# Patient Record
Sex: Female | Born: 1995 | Race: Black or African American | Hispanic: No | Marital: Single | Smoking: Former smoker
Health system: Southern US, Community
[De-identification: ages and names within clinical notes are randomized; demographics above are authoritative.]

## PROBLEM LIST (undated history)

## (undated) ENCOUNTER — Inpatient Hospital Stay (HOSPITAL_COMMUNITY): Payer: Self-pay

## (undated) DIAGNOSIS — J45909 Unspecified asthma, uncomplicated: Secondary | ICD-10-CM

## (undated) DIAGNOSIS — R011 Cardiac murmur, unspecified: Secondary | ICD-10-CM

## (undated) DIAGNOSIS — F909 Attention-deficit hyperactivity disorder, unspecified type: Secondary | ICD-10-CM

## (undated) DIAGNOSIS — Z3400 Encounter for supervision of normal first pregnancy, unspecified trimester: Secondary | ICD-10-CM

## (undated) HISTORY — DX: Cardiac murmur, unspecified: R01.1

## (undated) HISTORY — PX: INDUCED ABORTION: SHX677

## (undated) HISTORY — PX: TONSILLECTOMY: SHX5217

## (undated) HISTORY — PX: TONSILLECTOMY: SUR1361

## (undated) HISTORY — DX: Encounter for supervision of normal first pregnancy, unspecified trimester: Z34.00

---
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
## 2014-01-01 ENCOUNTER — Emergency Department (HOSPITAL_COMMUNITY): Payer: No Typology Code available for payment source

## 2014-01-01 ENCOUNTER — Encounter (HOSPITAL_COMMUNITY): Payer: Self-pay | Admitting: Emergency Medicine

## 2014-01-01 ENCOUNTER — Emergency Department (HOSPITAL_COMMUNITY)
Admission: EM | Admit: 2014-01-01 | Discharge: 2014-01-01 | Disposition: A | Discharge status: Home / Self Care | Payer: No Typology Code available for payment source | Attending: Emergency Medicine | Admitting: Emergency Medicine

## 2014-01-01 DIAGNOSIS — S199XXA Unspecified injury of neck, initial encounter: Principal | ICD-10-CM

## 2014-01-01 DIAGNOSIS — S0993XA Unspecified injury of face, initial encounter: Secondary | ICD-10-CM | POA: Insufficient documentation

## 2014-01-01 DIAGNOSIS — S46909A Unspecified injury of unspecified muscle, fascia and tendon at shoulder and upper arm level, unspecified arm, initial encounter: Secondary | ICD-10-CM | POA: Insufficient documentation

## 2014-01-01 DIAGNOSIS — J45909 Unspecified asthma, uncomplicated: Secondary | ICD-10-CM | POA: Insufficient documentation

## 2014-01-01 DIAGNOSIS — S4980XA Other specified injuries of shoulder and upper arm, unspecified arm, initial encounter: Secondary | ICD-10-CM | POA: Insufficient documentation

## 2014-01-01 DIAGNOSIS — Y92410 Unspecified street and highway as the place of occurrence of the external cause: Secondary | ICD-10-CM | POA: Insufficient documentation

## 2014-01-01 DIAGNOSIS — Y9389 Activity, other specified: Secondary | ICD-10-CM | POA: Insufficient documentation

## 2014-01-01 DIAGNOSIS — F17200 Nicotine dependence, unspecified, uncomplicated: Secondary | ICD-10-CM | POA: Insufficient documentation

## 2014-01-01 MED ORDER — METHOCARBAMOL 500 MG PO TABS: 500.0000 mg | ORAL_TABLET | Freq: Two times a day (BID) | ORAL | Status: DC | PRN

## 2014-01-01 MED ORDER — NAPROXEN 500 MG PO TABS: 500.0000 mg | ORAL_TABLET | Freq: Two times a day (BID) | ORAL | Status: DC
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
## 2014-01-02 NOTE — ED Provider Notes (Signed)
Medical screening examination/treatment/procedure(s) were performed by non-physician practitioner and as supervising physician I was immediately available for consultation/collaboration.  EKG Interpretation   None         Richardean Canalavid H Chiron Campione, MD 01/02/14 [phone]

## 2014-07-15 ENCOUNTER — Encounter (HOSPITAL_COMMUNITY): Payer: Self-pay | Admitting: Emergency Medicine

## 2014-07-15 ENCOUNTER — Emergency Department (HOSPITAL_COMMUNITY)
Admission: EM | Admit: 2014-07-15 | Discharge: 2014-07-15 | Disposition: A | Discharge status: Home / Self Care | Payer: Medicaid Other | Attending: Emergency Medicine | Admitting: Emergency Medicine

## 2014-07-15 ENCOUNTER — Emergency Department (HOSPITAL_COMMUNITY): Payer: Medicaid Other

## 2014-07-15 DIAGNOSIS — Z23 Encounter for immunization: Secondary | ICD-10-CM | POA: Insufficient documentation

## 2014-07-15 DIAGNOSIS — S4980XA Other specified injuries of shoulder and upper arm, unspecified arm, initial encounter: Secondary | ICD-10-CM | POA: Diagnosis not present

## 2014-07-15 DIAGNOSIS — S46909A Unspecified injury of unspecified muscle, fascia and tendon at shoulder and upper arm level, unspecified arm, initial encounter: Secondary | ICD-10-CM | POA: Diagnosis not present

## 2014-07-15 DIAGNOSIS — S51009A Unspecified open wound of unspecified elbow, initial encounter: Secondary | ICD-10-CM | POA: Diagnosis present

## 2014-07-15 DIAGNOSIS — IMO0002 Reserved for concepts with insufficient information to code with codable children: Secondary | ICD-10-CM | POA: Diagnosis not present

## 2014-07-15 DIAGNOSIS — Z792 Long term (current) use of antibiotics: Secondary | ICD-10-CM | POA: Insufficient documentation

## 2014-07-15 DIAGNOSIS — Y9289 Other specified places as the place of occurrence of the external cause: Secondary | ICD-10-CM | POA: Diagnosis not present

## 2014-07-15 DIAGNOSIS — J45909 Unspecified asthma, uncomplicated: Secondary | ICD-10-CM | POA: Insufficient documentation

## 2014-07-15 DIAGNOSIS — F17200 Nicotine dependence, unspecified, uncomplicated: Secondary | ICD-10-CM | POA: Insufficient documentation

## 2014-07-15 DIAGNOSIS — W010XXA Fall on same level from slipping, tripping and stumbling without subsequent striking against object, initial encounter: Secondary | ICD-10-CM | POA: Insufficient documentation

## 2014-07-15 DIAGNOSIS — S4991XA Unspecified injury of right shoulder and upper arm, initial encounter: Secondary | ICD-10-CM

## 2014-07-15 DIAGNOSIS — W19XXXA Unspecified fall, initial encounter: Secondary | ICD-10-CM

## 2014-07-15 DIAGNOSIS — Y9389 Activity, other specified: Secondary | ICD-10-CM | POA: Diagnosis not present

## 2014-07-15 DIAGNOSIS — Z79899 Other long term (current) drug therapy: Secondary | ICD-10-CM | POA: Insufficient documentation

## 2014-07-15 DIAGNOSIS — S40811A Abrasion of right upper arm, initial encounter: Secondary | ICD-10-CM

## 2014-07-15 MED ORDER — TETANUS-DIPHTH-ACELL PERTUSSIS 5-2.5-18.5 LF-MCG/0.5 IM SUSP
0.5000 mL | Freq: Once | INTRAMUSCULAR | Status: AC
  Administered 2014-07-15: 0.5 mL via INTRAMUSCULAR
  Filled 2014-07-15: qty 0.5

## 2014-07-15 NOTE — ED Provider Notes (Signed)
Medical screening examination/treatment/procedure(s) were performed by non-physician practitioner and as supervising physician I was immediately available for consultation/collaboration.   EKG Interpretation None       Doug SouSam Zooey Schreurs, MD 07/15/14 2153

## 2014-07-15 NOTE — Discharge Instructions (Signed)
Keep wound clean. Refer to attached documents for more information.  °

## 2014-07-15 NOTE — ED Notes (Signed)
She states she tripped and fell, striking a mirror with her right elbow area as she fell.  She has a few superficial lacs. At post. Right elbow area which I cover with a Telfa dressing. She also c/o some pain in her right shoulder, of which she is able to slowly perform r.o.m.

## 2014-07-15 NOTE — ED Provider Notes (Signed)
CSN: [id number]     Arrival date & time 07/15/14  1414 History  This chart was scribed for non-physician practitioner, Emilia Beck, PA-C, working with Doug Sou, MD, by Bronson Curb, ED Scribe. This patient was seen in room WTR7/WTR7 and the patient's care was started at 3:33 PM.    Chief Complaint  Patient presents with  . Extremity Laceration      Patient is a 18 y.o. female presenting with skin laceration. The history is provided by the patient. No language interpreter was used.  Laceration Location:  Shoulder/arm Shoulder/arm laceration location:  R elbow Bleeding: controlled   Laceration mechanism:  Broken glass Foreign body present:  Unable to specify Relieved by:  None tried Worsened by:  Nothing tried Ineffective treatments:  None tried Tetanus status:  Out of date   HPI Comments: Darlene Morgan is a 18 y.o. female who presents to the Emergency Department complaining of laceration near right elbow that occurred PTA. Patient states she tripped and struck a mirror with her right elbow. She is unsure of foreign bodies. There are superficial abrasions near the right elbow, in addition to right shoulder pain. Patient is not UTD on tetanus.  Past Medical History  Diagnosis Date  . Asthma    Past Surgical History  Procedure Laterality Date  . Tonsillectomy     Family History  Problem Relation Age of Onset  . Hypertension Other    History  Substance Use Topics  . Smoking status: Light Tobacco Smoker  . Smokeless tobacco: Not on file  . Alcohol Use: No   OB History   Grav Para Term Preterm Abortions TAB SAB Ect Mult Living                 Review of Systems  Constitutional: Negative for fever and chills.  Musculoskeletal: Positive for arthralgias (right shoulder).  Skin: Positive for wound.  All other systems reviewed and are negative.     Allergies  Review of patient's allergies indicates no known allergies.  Home Medications   Prior to  Admission medications   Medication Sig Start Date End Date Taking? Authorizing Provider  albuterol (PROVENTIL HFA;VENTOLIN HFA) 108 (90 BASE) MCG/ACT inhaler Inhale 1 puff into the lungs every 6 (six) hours as needed for wheezing or shortness of breath.   Yes Historical Provider, MD  doxycycline (DORYX) 100 MG DR capsule Take 100 mg by mouth 2 (two) times daily.   Yes Historical Provider, MD  guaifenesin (ROBITUSSIN) 100 MG/5ML syrup Take 200 mg by mouth 3 (three) times daily as needed for cough.   Yes Historical Provider, MD  medroxyPROGESTERone (DEPO-PROVERA) 150 MG/ML injection Inject 150 mg into the muscle every 3 (three) months.   Yes Historical Provider, MD  megestrol (MEGACE) 40 MG/ML suspension Take 200 mg by mouth daily.   Yes Historical Provider, MD   Triage Vitals: BP 116/74  Pulse 91  Temp(Src) 98.2 F (36.8 C) (Oral)  Resp 16  SpO2 99%  LMP 07/03/2014  Physical Exam  Nursing note and vitals reviewed. Constitutional: She is oriented to person, place, and time. She appears well-developed and well-nourished. No distress.  HENT:  Head: Normocephalic and atraumatic.  Eyes: Conjunctivae and EOM are normal.  Neck: Neck supple. No tracheal deviation present.  Cardiovascular: Normal rate.   Pulmonary/Chest: Effort normal. No respiratory distress.  Musculoskeletal: Normal range of motion. She exhibits tenderness.  FROM of right shoulder. Mild generalized tenderness of right shoulder. No obvious deformity.  Neurological: She is alert and  oriented to person, place, and time.  Skin: Skin is warm and dry.  multiple superficial abrasions of dorsal right arm near elbow. No bleeding noted.  Psychiatric: She has a normal mood and affect. Her behavior is normal.    ED Course  Procedures (including critical care time)  DIAGNOSTIC STUDIES: Oxygen Saturation is 99% on room air, normal by my interpretation.    COORDINATION OF CARE: At 1537 Discussed treatment plan with patient which  includes imaging (right shoulder). Patient agrees.   Labs Review Labs Reviewed - No data to display  Imaging Review Dg Shoulder Right  07/15/2014   CLINICAL DATA:  Status post fall.  Right shoulder pain.  EXAM: RIGHT SHOULDER - 2+ VIEW  COMPARISON:  None.  FINDINGS: Imaged bones, joints and soft tissues appear normal.  IMPRESSION: Negative exam.   Electronically Signed   By: Drusilla Kannerhomas  Dalessio M.D.   On: 07/15/2014 16:04     EKG Interpretation None      MDM   Final diagnoses:  Fall, initial encounter  Arm abrasion, right, initial encounter  Right shoulder injury, initial encounter    4:25 PM Xray unremarkable for acute changes. Patient given tdap and abrasion was cleaned and bandaged. Vitals stable and patient afebrile. No other injury.   I personally performed the services described in this documentation, which was scribed in my presence. The recorded information has been reviewed and is accurate.    Emilia BeckKaitlyn Shaylon Aden, PA-C 07/15/14 1626

## 2014-11-27 ENCOUNTER — Encounter: Payer: Self-pay | Admitting: *Deleted

## 2015-01-30 ENCOUNTER — Encounter (HOSPITAL_COMMUNITY): Payer: Self-pay | Admitting: *Deleted

## 2015-01-30 ENCOUNTER — Emergency Department (HOSPITAL_COMMUNITY)
Admission: EM | Admit: 2015-01-30 | Discharge: 2015-01-30 | Disposition: A | Discharge status: Home / Self Care | Payer: Medicaid Other | Attending: Emergency Medicine | Admitting: Emergency Medicine

## 2015-01-30 DIAGNOSIS — Z79899 Other long term (current) drug therapy: Secondary | ICD-10-CM | POA: Insufficient documentation

## 2015-01-30 DIAGNOSIS — J45909 Unspecified asthma, uncomplicated: Secondary | ICD-10-CM | POA: Insufficient documentation

## 2015-01-30 DIAGNOSIS — Z720 Tobacco use: Secondary | ICD-10-CM | POA: Diagnosis not present

## 2015-01-30 DIAGNOSIS — B9689 Other specified bacterial agents as the cause of diseases classified elsewhere: Secondary | ICD-10-CM

## 2015-01-30 DIAGNOSIS — N898 Other specified noninflammatory disorders of vagina: Secondary | ICD-10-CM | POA: Diagnosis present

## 2015-01-30 DIAGNOSIS — Z793 Long term (current) use of hormonal contraceptives: Secondary | ICD-10-CM | POA: Insufficient documentation

## 2015-01-30 DIAGNOSIS — Z3202 Encounter for pregnancy test, result negative: Secondary | ICD-10-CM | POA: Diagnosis not present

## 2015-01-30 DIAGNOSIS — Z7952 Long term (current) use of systemic steroids: Secondary | ICD-10-CM | POA: Insufficient documentation

## 2015-01-30 DIAGNOSIS — N760 Acute vaginitis: Secondary | ICD-10-CM | POA: Insufficient documentation

## 2015-01-30 LAB — WET PREP, GENITAL
Trich, Wet Prep: NONE SEEN
Yeast Wet Prep HPF POC: NONE SEEN

## 2015-01-30 LAB — POC URINE PREG, ED: Preg Test, Ur: NEGATIVE

## 2015-01-30 MED ORDER — AZITHROMYCIN 1 G PO PACK
1.0000 g | PACK | Freq: Once | ORAL | Status: AC
  Administered 2015-01-30: 1 g via ORAL
  Filled 2015-01-30: qty 1

## 2015-01-30 MED ORDER — CLINDAMYCIN HCL 150 MG PO CAPS: 300.0000 mg | ORAL_CAPSULE | Freq: Two times a day (BID) | ORAL | Status: DC

## 2015-01-30 MED ORDER — LIDOCAINE HCL (PF) 1 % IJ SOLN
INTRAMUSCULAR | Status: AC
  Administered 2015-01-30: 2.1 mL
  Filled 2015-01-30: qty 5

## 2015-01-30 MED ORDER — CEFTRIAXONE SODIUM 250 MG IJ SOLR
250.0000 mg | Freq: Once | INTRAMUSCULAR | Status: AC
  Administered 2015-01-30: 250 mg via INTRAMUSCULAR
  Filled 2015-01-30: qty 250

## 2015-01-30 NOTE — Discharge Instructions (Signed)
Bacterial Vaginosis Bacterial vaginosis is a vaginal infection that occurs when the normal balance of bacteria in the vagina is disrupted. It results from an overgrowth of certain bacteria. This is the most common vaginal infection in women of childbearing age. Treatment is important to prevent complications, especially in pregnant women, as it can cause a premature delivery. CAUSES  Bacterial vaginosis is caused by an increase in harmful bacteria that are normally present in smaller amounts in the vagina. Several different kinds of bacteria can cause bacterial vaginosis. However, the reason that the condition develops is not fully understood. RISK FACTORS Certain activities or behaviors can put you at an increased risk of developing bacterial vaginosis, including:  Having a new sex partner or multiple sex partners.  Douching.  Using an intrauterine device (IUD) for contraception. Women do not get bacterial vaginosis from toilet seats, bedding, swimming pools, or contact with objects around them. SIGNS AND SYMPTOMS  Some women with bacterial vaginosis have no signs or symptoms. Common symptoms include:  Grey vaginal discharge.  A fishlike odor with discharge, especially after sexual intercourse.  Itching or burning of the vagina and vulva.  Burning or pain with urination. DIAGNOSIS  Your health care provider will take a medical history and examine the vagina for signs of bacterial vaginosis. A sample of vaginal fluid may be taken. Your health care provider will look at this sample under a microscope to check for bacteria and abnormal cells. A vaginal pH test may also be done.  TREATMENT  Bacterial vaginosis may be treated with antibiotic medicines. These may be given in the form of a pill or a vaginal cream. A second round of antibiotics may be prescribed if the condition comes back after treatment.  HOME CARE INSTRUCTIONS   Only take over-the-counter or prescription medicines as  directed by your health care provider.  If antibiotic medicine was prescribed, take it as directed. Make sure you finish it even if you start to feel better.  Do not have sex until treatment is completed.  Tell all sexual partners that you have a vaginal infection. They should see their health care provider and be treated if they have problems, such as a mild rash or itching.  Practice safe sex by using condoms and only having one sex partner. SEEK MEDICAL CARE IF:   Your symptoms are not improving after 3 days of treatment.  You have increased discharge or pain.  You have a fever. MAKE SURE YOU:   Understand these instructions.  Will watch your condition.  Will get help right away if you are not doing well or get worse. FOR MORE INFORMATION  Centers for Disease Control and Prevention, Division of STD Prevention: www.cdc.gov/std American Sexual Health Association (ASHA): www.ashastd.org  Document Released: 11/17/2005 Document Revised: 09/07/2013 Document Reviewed: 06/29/2013 [employer] Patient Information 2015 [employer]. This information is not intended to replace advice given to you by your health care provider. Make sure you discuss any questions you have with your health care provider.  

## 2015-01-30 NOTE — ED Provider Notes (Signed)
CSN: [id number]     Arrival date & time 01/30/15  1321 History   First MD Initiated Contact with Patient 01/30/15 1601     Chief Complaint  Patient presents with  . Possible Pregnancy  . Vaginal Discharge    Patient is a 19 y.o. female presenting with pregnancy problem and vaginal discharge. The history is provided by the patient. No language interpreter was used.  Possible Pregnancy  Vaginal Discharge  Darlene Morgan presents for STD check. She reports that she had unprotected sex a week ago and today she developed a large amount of white vaginal discharge. She is concerned that she has chlamydia again or she might be pregnant. She has a history of chlamydia 4 months ago and never got tested to see if she had cleared up with treatment. She denies any fevers, nausea, vomiting, abdominal pain, dysuria, diarrhea. Symptoms are mild and constant. She has no history of prior pregnancy.  Past Medical History  Diagnosis Date  . Asthma    Past Surgical History  Procedure Laterality Date  . Tonsillectomy     Family History  Problem Relation Age of Onset  . Hypertension Other    History  Substance Use Topics  . Smoking status: Light Tobacco Smoker  . Smokeless tobacco: Not on file  . Alcohol Use: No   OB History    No data available     Review of Systems  Genitourinary: Positive for vaginal discharge.  All other systems reviewed and are negative.     Allergies  Review of patient's allergies indicates no known allergies.  Home Medications   Prior to Admission medications   Medication Sig Start Date End Date Taking? Authorizing Provider  albuterol (PROVENTIL HFA;VENTOLIN HFA) 108 (90 BASE) MCG/ACT inhaler Inhale 1 puff into the lungs every 6 (six) hours as needed for wheezing or shortness of breath.    Historical Provider, MD  guaifenesin (ROBITUSSIN) 100 MG/5ML syrup Take 200 mg by mouth 3 (three) times daily as needed for cough.    Historical Provider, MD  medroxyPROGESTERone  (DEPO-PROVERA) 150 MG/ML injection Inject 150 mg into the muscle every 3 (three) months.    Historical Provider, MD  megestrol (MEGACE) 40 MG/ML suspension Take 200 mg by mouth daily.    Historical Provider, MD   BP 128/62 mmHg  Pulse 70  Temp(Src) 98 F (36.7 C) (Oral)  Resp 18  SpO2 100% Physical Exam  Constitutional: She is oriented to person, place, and time. She appears well-developed and well-nourished.  HENT:  Head: Normocephalic and atraumatic.  Cardiovascular: Normal rate and regular rhythm.   No murmur heard. Pulmonary/Chest: Effort normal and breath sounds normal. No respiratory distress.  Abdominal: Soft. There is no tenderness. There is no rebound and no guarding.  Genitourinary:  Moderate white vaginal discharge.  Os closed.  No CMT.  No adnexal tenderness.  Musculoskeletal: She exhibits no edema or tenderness.  Neurological: She is alert and oriented to person, place, and time.  Skin: Skin is warm and dry.  Psychiatric: She has a normal mood and affect. Her behavior is normal.  Nursing note and vitals reviewed.   ED Course  Procedures (including critical care time) Labs Review Labs Reviewed  WET PREP, GENITAL - Abnormal; Notable for the following:    Clue Cells Wet Prep HPF POC MANY (*)    WBC, Wet Prep HPF POC FEW (*)    All other components within normal limits  RPR  HIV ANTIBODY (ROUTINE TESTING)  POC URINE  PREG, ED  GC/CHLAMYDIA PROBE AMP ([place])    Imaging Review No results found.   EKG Interpretation None      MDM   Final diagnoses:  Bacterial vaginosis    Patient here for evaluation of vaginal discharge. Wet prep and exam are consistent with BV. There is no current evidence of PID or TOA. Treating for possible cervicitis given patient's risk factors and symptoms. Discussed with patient need to sustain from intercourse until she is completely treatment and she gets rechecked. Return precautions discussed.    Tilden FossaElizabeth Morgin Halls,  MD 01/30/15 2001
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
## 2015-01-30 NOTE — ED Notes (Signed)
PA at bedside.

## 2015-01-31 LAB — RPR: RPR Ser Ql: NONREACTIVE

## 2015-01-31 LAB — GC/CHLAMYDIA PROBE AMP (~~LOC~~) NOT AT ARMC
Chlamydia: NEGATIVE
Neisseria Gonorrhea: NEGATIVE

## 2015-01-31 LAB — HIV ANTIBODY (ROUTINE TESTING W REFLEX): HIV Screen 4th Generation wRfx: NONREACTIVE

## 2015-02-07 ENCOUNTER — Telehealth (HOSPITAL_BASED_OUTPATIENT_CLINIC_OR_DEPARTMENT_OTHER): Payer: Self-pay | Admitting: Emergency Medicine

## 2015-03-04 ENCOUNTER — Emergency Department (HOSPITAL_COMMUNITY): Payer: Medicaid Other

## 2015-03-04 ENCOUNTER — Emergency Department (HOSPITAL_COMMUNITY)
Admission: EM | Admit: 2015-03-04 | Discharge: 2015-03-04 | Disposition: A | Discharge status: Home / Self Care | Payer: Medicaid Other | Attending: Emergency Medicine | Admitting: Emergency Medicine

## 2015-03-04 ENCOUNTER — Encounter (HOSPITAL_COMMUNITY): Payer: Self-pay | Admitting: Emergency Medicine

## 2015-03-04 DIAGNOSIS — Z720 Tobacco use: Secondary | ICD-10-CM | POA: Diagnosis not present

## 2015-03-04 DIAGNOSIS — Y929 Unspecified place or not applicable: Secondary | ICD-10-CM | POA: Insufficient documentation

## 2015-03-04 DIAGNOSIS — J45909 Unspecified asthma, uncomplicated: Secondary | ICD-10-CM | POA: Diagnosis not present

## 2015-03-04 DIAGNOSIS — S80212A Abrasion, left knee, initial encounter: Secondary | ICD-10-CM | POA: Diagnosis not present

## 2015-03-04 DIAGNOSIS — Y999 Unspecified external cause status: Secondary | ICD-10-CM | POA: Insufficient documentation

## 2015-03-04 DIAGNOSIS — S199XXA Unspecified injury of neck, initial encounter: Secondary | ICD-10-CM | POA: Diagnosis not present

## 2015-03-04 DIAGNOSIS — Y939 Activity, unspecified: Secondary | ICD-10-CM | POA: Insufficient documentation

## 2015-03-04 DIAGNOSIS — S8992XA Unspecified injury of left lower leg, initial encounter: Secondary | ICD-10-CM | POA: Diagnosis present

## 2015-03-04 DIAGNOSIS — T07XXXA Unspecified multiple injuries, initial encounter: Secondary | ICD-10-CM

## 2015-03-04 DIAGNOSIS — S4992XA Unspecified injury of left shoulder and upper arm, initial encounter: Secondary | ICD-10-CM | POA: Insufficient documentation

## 2015-03-04 DIAGNOSIS — Z3202 Encounter for pregnancy test, result negative: Secondary | ICD-10-CM | POA: Diagnosis not present

## 2015-03-04 DIAGNOSIS — S60512A Abrasion of left hand, initial encounter: Secondary | ICD-10-CM | POA: Diagnosis not present

## 2015-03-04 DIAGNOSIS — Z79899 Other long term (current) drug therapy: Secondary | ICD-10-CM | POA: Diagnosis not present

## 2015-03-04 DIAGNOSIS — S3992XA Unspecified injury of lower back, initial encounter: Secondary | ICD-10-CM | POA: Diagnosis not present

## 2015-03-04 LAB — POC URINE PREG, ED: Preg Test, Ur: NEGATIVE

## 2015-03-04 MED ORDER — IBUPROFEN 800 MG PO TABS: 800.0000 mg | ORAL_TABLET | Freq: Three times a day (TID) | ORAL | Status: DC | PRN

## 2015-03-04 MED ORDER — IBUPROFEN 800 MG PO TABS
800.0000 mg | ORAL_TABLET | Freq: Once | ORAL | Status: AC
  Administered 2015-03-04: 800 mg via ORAL
  Filled 2015-03-04: qty 1
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
## 2015-03-04 NOTE — Discharge Instructions (Signed)
Abrasion °An abrasion is a cut or scrape of the skin. Abrasions do not extend through all layers of the skin and most heal within 10 days. It is important to care for your abrasion properly to prevent infection. °CAUSES  °Most abrasions are caused by falling on, or gliding across, the ground or other surface. When your skin rubs on something, the outer and inner layer of skin rubs off, causing an abrasion. °DIAGNOSIS  °Your caregiver will be able to diagnose an abrasion during a physical exam.  °TREATMENT  °Your treatment depends on how large and deep the abrasion is. Generally, your abrasion will be cleaned with water and a mild soap to remove any dirt or debris. An antibiotic ointment may be put over the abrasion to prevent an infection. A bandage (dressing) may be wrapped around the abrasion to keep it from getting dirty.  °You may need a tetanus shot if: °· You cannot remember when you had your last tetanus shot. °· You have never had a tetanus shot. °· The injury broke your skin. °If you get a tetanus shot, your arm may swell, get red, and feel warm to the touch. This is common and not a problem. If you need a tetanus shot and you choose not to have one, there is a rare chance of getting tetanus. Sickness from tetanus can be serious.  °HOME CARE INSTRUCTIONS  °· If a dressing was applied, change it at least once a day or as directed by your caregiver. If the bandage sticks, soak it off with warm water.   °· Wash the area with water and a mild soap to remove all the ointment 2 times a day. Rinse off the soap and pat the area dry with a clean towel.   °· Reapply any ointment as directed by your caregiver. This will help prevent infection and keep the bandage from sticking. Use gauze over the wound and under the dressing to help keep the bandage from sticking.   °· Change your dressing right away if it becomes wet or dirty.   °· Only take over-the-counter or prescription medicines for pain, discomfort, or fever as  directed by your caregiver.   °· Follow up with your caregiver within 24-48 hours for a wound check, or as directed. If you were not given a wound-check appointment, look closely at your abrasion for redness, swelling, or pus. These are signs of infection. °SEEK IMMEDIATE MEDICAL CARE IF:  °· You have increasing pain in the wound.   °· You have redness, swelling, or tenderness around the wound.   °· You have pus coming from the wound.   °· You have a fever or persistent symptoms for more than 2-3 days. °· You have a fever and your symptoms suddenly get worse. °· You have a bad smell coming from the wound or dressing.   °MAKE SURE YOU:  °· Understand these instructions. °· Will watch your condition. °· Will get help right away if you are not doing well or get worse. °Document Released: 08/27/2005 Document Revised: 11/03/2012 Document Reviewed: 10/21/2011 °ExitCare® Patient Information ©2015 [employer]. This information is not intended to replace advice given to you by your health care provider. Make sure you discuss any questions you have with your health care provider. °Assault, General °Assault includes any behavior, whether intentional or reckless, which results in bodily injury to another person and/or damage to property. Included in this would be any behavior, intentional or reckless, that by its nature would be understood (interpreted) by a reasonable person as intent   to harm another person or to damage his/her property. Threats may be oral or written. They may be communicated through regular mail, computer, fax, or phone. These threats may be direct or implied. °FORMS OF ASSAULT INCLUDE: °Physically assaulting a person. This includes physical threats to inflict physical harm as well as: °Slapping. °Hitting. °Poking. °Kicking. °Punching. °Pushing. °Arson. °Sabotage. °Equipment vandalism. °Damaging or destroying property. °Throwing or hitting objects. °Displaying a weapon or an object that appears to be a  weapon in a threatening manner. °Carrying a firearm of any kind. °Using a weapon to harm someone. °Using greater physical size/strength to intimidate another. °Making intimidating or threatening gestures. °Bullying. °Hazing. °Intimidating, threatening, hostile, or abusive language directed toward another person. °It communicates the intention to engage in violence against that person. And it leads a reasonable person to expect that violent behavior may occur. °Stalking another person. °IF IT HAPPENS AGAIN: °Immediately call for emergency help (911 in [place] °If someone poses clear and immediate danger to you, seek legal authorities to have a protective or restraining order put in place. °Less threatening assaults can at least be reported to authorities. °STEPS TO TAKE IF A SEXUAL ASSAULT HAS HAPPENED °Go to an area of safety. This may include a shelter or staying with a friend. Stay away from the area where you have been attacked. A large percentage of sexual assaults are caused by a friend, relative or associate. °If medications were given by your caregiver, take them as directed for the full length of time prescribed. °Only take over-the-counter or prescription medicines for pain, discomfort, or fever as directed by your caregiver. °If you have come in contact with a sexual disease, find out if you are to be tested again. If your caregiver is concerned about the HIV/AIDS virus, he/she may require you to have continued testing for several months. °For the protection of your privacy, test results can not be given over the phone. Make sure you receive the results of your test. If your test results are not back during your visit, make an appointment with your caregiver to find out the results. Do not assume everything is normal if you have not heard from your caregiver or the medical facility. It is important for you to follow up on all of your test results. °File appropriate papers with authorities. This is important  in all assaults, even if it has occurred in a family or by a friend. °SEEK MEDICAL CARE IF: °You have new problems because of your injuries. °You have problems that may be because of the medicine you are taking, such as: °Rash. °Itching. °Swelling. °Trouble breathing. °You develop belly (abdominal) pain, feel sick to your stomach (nausea) or are vomiting. °You begin to run a temperature. °You need supportive care or referral to a rape crisis center. These are centers with trained personnel who can help you get through this ordeal. °SEEK IMMEDIATE MEDICAL CARE IF: °You are afraid of being threatened, beaten, or abused. In [place] call 911. °You receive new injuries related to abuse. °You develop severe pain in any area injured in the assault or have any change in your condition that concerns you. °You faint or lose consciousness. °You develop chest pain or shortness of breath. °Document Released: 11/17/2005 Document Revised: 02/09/2012 Document Reviewed: 07/05/2008 °ExitCare® Patient Information ©2015 [employer]. This information is not intended to replace advice given to you by your health care provider. Make sure you discuss any questions you have with your health care provider. °Contusion °A contusion   is a deep bruise. Contusions are the result of an injury that caused bleeding under the skin. The contusion may turn blue, purple, or yellow. Minor injuries will give you a painless contusion, but more severe contusions may stay painful and swollen for a few weeks.  °CAUSES  °A contusion is usually caused by a blow, trauma, or direct force to an area of the body. °SYMPTOMS  °· Swelling and redness of the injured area. °· Bruising of the injured area. °· Tenderness and soreness of the injured area. °· Pain. °DIAGNOSIS  °The diagnosis can be made by taking a history and physical exam. An X-ray, CT scan, or MRI may be needed to determine if there were any associated injuries, such as fractures. °TREATMENT  °Specific  treatment will depend on what area of the body was injured. In general, the best treatment for a contusion is resting, icing, elevating, and applying cold compresses to the injured area. Over-the-counter medicines may also be recommended for pain control. Ask your caregiver what the best treatment is for your contusion. °HOME CARE INSTRUCTIONS  °· Put ice on the injured area. °¨ Put ice in a plastic bag. °¨ Place a towel between your skin and the bag. °¨ Leave the ice on for 15-20 minutes, 3-4 times a day, or as directed by your health care provider. °· Only take over-the-counter or prescription medicines for pain, discomfort, or fever as directed by your caregiver. Your caregiver may recommend avoiding anti-inflammatory medicines (aspirin, ibuprofen, and naproxen) for 48 hours because these medicines may increase bruising. °· Rest the injured area. °· If possible, elevate the injured area to reduce swelling. °SEEK IMMEDIATE MEDICAL CARE IF:  °· You have increased bruising or swelling. °· You have pain that is getting worse. °· Your swelling or pain is not relieved with medicines. °MAKE SURE YOU:  °· Understand these instructions. °· Will watch your condition. °· Will get help right away if you are not doing well or get worse. °Document Released: 08/27/2005 Document Revised: 11/22/2013 Document Reviewed: 09/22/2011 °ExitCare® Patient Information ©2015 [employer]. This information is not intended to replace advice given to you by your health care provider. Make sure you discuss any questions you have with your health care provider. ° °

## 2015-05-22 ENCOUNTER — Encounter (HOSPITAL_COMMUNITY): Payer: Self-pay | Admitting: *Deleted

## 2015-05-22 ENCOUNTER — Emergency Department (HOSPITAL_COMMUNITY)
Admission: EM | Admit: 2015-05-22 | Discharge: 2015-05-22 | Disposition: A | Discharge status: Home / Self Care | Payer: Medicaid Other | Attending: Emergency Medicine | Admitting: Emergency Medicine

## 2015-05-22 DIAGNOSIS — Z792 Long term (current) use of antibiotics: Secondary | ICD-10-CM | POA: Insufficient documentation

## 2015-05-22 DIAGNOSIS — B373 Candidiasis of vulva and vagina: Secondary | ICD-10-CM | POA: Insufficient documentation

## 2015-05-22 DIAGNOSIS — J45909 Unspecified asthma, uncomplicated: Secondary | ICD-10-CM | POA: Diagnosis not present

## 2015-05-22 DIAGNOSIS — Z202 Contact with and (suspected) exposure to infections with a predominantly sexual mode of transmission: Secondary | ICD-10-CM | POA: Diagnosis present

## 2015-05-22 DIAGNOSIS — Z720 Tobacco use: Secondary | ICD-10-CM | POA: Insufficient documentation

## 2015-05-22 DIAGNOSIS — Z3202 Encounter for pregnancy test, result negative: Secondary | ICD-10-CM | POA: Insufficient documentation

## 2015-05-22 DIAGNOSIS — B3731 Acute candidiasis of vulva and vagina: Secondary | ICD-10-CM

## 2015-05-22 DIAGNOSIS — Z79899 Other long term (current) drug therapy: Secondary | ICD-10-CM | POA: Diagnosis not present

## 2015-05-22 LAB — WET PREP, GENITAL
Clue Cells Wet Prep HPF POC: NONE SEEN
Trich, Wet Prep: NONE SEEN

## 2015-05-22 LAB — URINALYSIS, ROUTINE W REFLEX MICROSCOPIC
Bilirubin Urine: NEGATIVE
Glucose, UA: NEGATIVE mg/dL
Hgb urine dipstick: NEGATIVE
Ketones, ur: NEGATIVE mg/dL
Leukocytes, UA: NEGATIVE
Nitrite: NEGATIVE
Protein, ur: NEGATIVE mg/dL
Specific Gravity, Urine: 1.02 (ref 1.005–1.030)
Urobilinogen, UA: 0.2 mg/dL (ref 0.0–1.0)
pH: 7 (ref 5.0–8.0)

## 2015-05-22 LAB — POC URINE PREG, ED: Preg Test, Ur: NEGATIVE

## 2015-05-22 MED ORDER — AZITHROMYCIN 250 MG PO TABS
1000.0000 mg | ORAL_TABLET | Freq: Once | ORAL | Status: AC
  Administered 2015-05-22: 1000 mg via ORAL
  Filled 2015-05-22: qty 4

## 2015-05-22 MED ORDER — LIDOCAINE HCL (PF) 1 % IJ SOLN
0.9000 mL | Freq: Once | INTRAMUSCULAR | Status: AC
  Administered 2015-05-22: 0.9 mL
  Filled 2015-05-22: qty 5

## 2015-05-22 MED ORDER — CEFTRIAXONE SODIUM 250 MG IJ SOLR
250.0000 mg | Freq: Once | INTRAMUSCULAR | Status: AC
  Administered 2015-05-22: 250 mg via INTRAMUSCULAR
  Filled 2015-05-22: qty 250

## 2015-05-22 MED ORDER — FLUCONAZOLE 100 MG PO TABS
150.0000 mg | ORAL_TABLET | Freq: Once | ORAL | Status: AC
Start: 2015-05-22 — End: 2015-05-22
  Administered 2015-05-22: 150 mg via ORAL
  Filled 2015-05-22: qty 2

## 2015-05-22 NOTE — ED Provider Notes (Signed)
CSN: [id number]     Arrival date & time 05/22/15  1026 History   First MD Initiated Contact with Patient 05/22/15 1218     Chief Complaint  Patient presents with  . SEXUALLY TRANSMITTED DISEASE  . Possible Pregnancy     (Consider location/radiation/quality/duration/timing/severity/associated sxs/prior Treatment) HPI Comments: Patient is a 19 year old female presenting to the emergency department for vaginal itching without discharge over the last several days. No modifying factors identified. States also concerned about possible sexually transmitted diseases. She is also requesting a pregnancy test. Patient has had two previous pregnancies, one miscarriage and one abortion. No abdominal surgical history.   Patient is a 19 y.o. female presenting with pregnancy problem.  Possible Pregnancy    Past Medical History  Diagnosis Date  . Asthma    Past Surgical History  Procedure Laterality Date  . Tonsillectomy     Family History  Problem Relation Age of Onset  . Hypertension Other    History  Substance Use Topics  . Smoking status: Current Every Day Smoker    Types: Cigarettes  . Smokeless tobacco: Not on file  . Alcohol Use: Yes   OB History    No data available     Review of Systems  Genitourinary:       Vaginal itching  All other systems reviewed and are negative.     Allergies  Review of patient's allergies indicates no known allergies.  Home Medications   Prior to Admission medications   Medication Sig Start Date End Date Taking? Authorizing Provider  albuterol (PROVENTIL HFA;VENTOLIN HFA) 108 (90 BASE) MCG/ACT inhaler Inhale 2 puffs into the lungs every 6 (six) hours as needed for wheezing or shortness of breath (wheezing).     Historical Provider, MD  clindamycin (CLEOCIN) 150 MG capsule Take 2 capsules (300 mg total) by mouth 2 (two) times daily. 01/30/15   Tilden Fossa, MD  ibuprofen (ADVIL,MOTRIN) 800 MG tablet Take 1 tablet (800 mg total) by mouth every  8 (eight) hours as needed for mild pain. 03/04/15   Kristen N Ward, DO   BP 116/80 mmHg  Pulse 68  Temp(Src) 98.4 F (36.9 C) (Oral)  Resp 18  Ht 5\' 5"  (1.651 m)  SpO2 99%  LMP 05/08/2015 Physical Exam  Constitutional: She is oriented to person, place, and time. She appears well-developed and well-nourished. No distress.  HENT:  Head: Normocephalic and atraumatic.  Right Ear: External ear normal.  Left Ear: External ear normal.  Nose: Nose normal.  Mouth/Throat: Oropharynx is clear and moist.  Eyes: Conjunctivae are normal.  Neck: Normal range of motion. Neck supple.  No nuchal rigidity.   Cardiovascular: Normal rate, regular rhythm and normal heart sounds.   Pulmonary/Chest: Effort normal and breath sounds normal.  Abdominal: Soft. There is no tenderness.  Musculoskeletal: Normal range of motion.  Neurological: She is alert and oriented to person, place, and time.  Skin: Skin is warm and dry. She is not diaphoretic.  Psychiatric: She has a normal mood and affect.  Nursing note and vitals reviewed.  Exam performed by Francee Piccolo L,  exam chaperoned Date: 05/22/2015 Pelvic exam: normal external genitalia without evidence of trauma. VULVA: normal appearing vulva with no masses, tenderness or lesion. VAGINA: normal appearing vagina with normal color and discharge, no lesions. CERVIX: normal appearing cervix without lesions, cervical motion tenderness absent, cervical os closed with out purulent discharge; vaginal discharge - white and copious, Wet prep and DNA probe for chlamydia and GC obtained.  ADNEXA: normal adnexa in size, nontender and no masses UTERUS: uterus is normal size, shape, consistency and nontender.   ED Course  Procedures (including critical care time) Medications  cefTRIAXone (ROCEPHIN) injection 250 mg (250 mg Intramuscular Given 05/22/15 1408)  azithromycin (ZITHROMAX) tablet 1,000 mg (1,000 mg Oral Given 05/22/15 1413)  fluconazole (DIFLUCAN)  tablet 150 mg (150 mg Oral Given 05/22/15 1412)  lidocaine (PF) (XYLOCAINE) 1 % injection 0.9 mL (0.9 mLs Other Given 05/22/15 1408)    Labs Review Labs Reviewed  WET PREP, GENITAL - Abnormal; Notable for the following:    Yeast Wet Prep HPF POC FEW (*)    WBC, Wet Prep HPF POC FEW (*)    All other components within normal limits  URINALYSIS, ROUTINE W REFLEX MICROSCOPIC (NOT AT [facility])  POC URINE PREG, ED  GC/CHLAMYDIA PROBE AMP () NOT AT [facility]    Imaging Review No results found.   EKG Interpretation None      MDM   Final diagnoses:  Vaginal yeast infection    Filed Vitals:   05/22/15 1426  BP:   Pulse: 68  Temp:   Resp:    Afebrile, NAD, non-toxic appearing, AAOx4.  I have reviewed nursing notes, vital signs, and all appropriate lab and imaging results if ordered as above.  Patient to be discharged with instructions to follow up with OBGYN. Pt understands GC/Chlamydia cultures pending and that they will need to inform all sexual partners within the last 6 months if results return positive. Pt has been treated prophylacticly with azithromycin and rocephin due to pts history, pelvic exam, and wet prep with increased WBCs. Pt advised that she will receive a call in 48 hours if the test is positive and to refrain from sexual activity for 48 hours. If the test is positive, pt is advised to refrain from sexual activity for 10 days for the medicine to take effect.  Pt not concerning for PID because hemodynamically stable and no cervical motion tenderness on pelvic exam. Pt has also been treated with Diflucan for yeast infection. Discussed that because pt has had recent unprotected sex, might want to consider getting tested for HIV as well. Counseled pt that latex condoms are the only way to prevent against STDs or HIV.  Patient left without her paperwork.        Francee Piccolo, PA-C 05/22/15 1539  Elwin Mocha, MD 05/22/15 1556

## 2015-05-22 NOTE — ED Notes (Signed)
Pt having vaginal itching, is requesting std check and preg test, lmp 6/7.

## 2015-05-22 NOTE — ED Notes (Addendum)
Pt leaving room E 41. Asked again to patiently wait and states "I have to go" and was witnessed by this RN as well as Hospital doctor with phlebotomy, leaving the department in NAD. Denies pain. Pt did not sign and would not allow [place] to get a final set of vitals. States "I don't need my papers." Pt walking fast our of department.

## 2015-05-22 NOTE — Discharge Instructions (Signed)
Please follow up with your primary care physician in 1-2 days. If you do not have one please call the [facility] number listed above. Please follow up with Ob/Gyn to schedule a follow up appointment. Please read all discharge instructions and return precautions.    Candida Infection A Candida infection (also called yeast, fungus, and Monilia infection) is an overgrowth of yeast that can occur anywhere on the body. A yeast infection commonly occurs in warm, moist body areas. Usually, the infection remains localized but can spread to become a systemic infection. A yeast infection may be a sign of a more severe disease such as diabetes, leukemia, or AIDS. A yeast infection can occur in both men and women. In women, Candida vaginitis is a vaginal infection. It is one of the most common causes of vaginitis. Men usually do not have symptoms or know they have an infection until other problems develop. Men may find out they have a yeast infection because their sex partner has a yeast infection. Uncircumcised men are more likely to get a yeast infection than circumcised men. This is because the uncircumcised glans is not exposed to air and does not remain as dry as that of a circumcised glans. Older adults may develop yeast infections around dentures. CAUSES  Women  Antibiotics.  Steroid medication taken for a long time.  Being overweight (obese).  Diabetes.  Poor immune condition.  Certain serious medical conditions.  Immune suppressive medications for organ transplant patients.  Chemotherapy.  Pregnancy.  Menstruation.  Stress and fatigue.  Intravenous drug use.  Oral contraceptives.  Wearing tight-fitting clothes in the crotch area.  Catching it from a sex partner who has a yeast infection.  Spermicide.  Intravenous, urinary, or other catheters. Men  Catching it from a sex partner who has a yeast infection.  Having oral or anal sex with a person who has  the infection.  Spermicide.  Diabetes.  Antibiotics.  Poor immune system.  Medications that suppress the immune system.  Intravenous drug use.  Intravenous, urinary, or other catheters. SYMPTOMS  Women  Thick, white vaginal discharge.  Vaginal itching.  Redness and swelling in and around the vagina.  Irritation of the lips of the vagina and perineum.  Blisters on the vaginal lips and perineum.  Painful sexual intercourse.  Low blood sugar (hypoglycemia).  Painful urination.  Bladder infections.  Intestinal problems such as constipation, indigestion, bad breath, bloating, increase in gas, diarrhea, or loose stools. Men  Men may develop intestinal problems such as constipation, indigestion, bad breath, bloating, increase in gas, diarrhea, or loose stools.  Dry, cracked skin on the penis with itching or discomfort.  Jock itch.  Dry, flaky skin.  Athlete's foot.  Hypoglycemia. DIAGNOSIS  Women  A history and an exam are performed.  The discharge may be examined under a microscope.  A culture may be taken of the discharge. Men  A history and an exam are performed.  Any discharge from the penis or areas of cracked skin will be looked at under the microscope and cultured.  Stool samples may be cultured. TREATMENT  Women  Vaginal antifungal suppositories and creams.  Medicated creams to decrease irritation and itching on the outside of the vagina.  Warm compresses to the perineal area to decrease swelling and discomfort.  Oral antifungal medications.  Medicated vaginal suppositories or cream for repeated or recurrent infections.  Wash and dry the irritation areas before applying the cream.  Eating yogurt with Lactobacillus may  help with prevention and treatment.  Sometimes painting the vagina with gentian violet solution may help if creams and suppositories do not work. Men  Antifungal creams and oral antifungal medications.  Sometimes  treatment must continue for 30 days after the symptoms go away to prevent recurrence. HOME CARE INSTRUCTIONS  Women  Use cotton underwear and avoid tight-fitting clothing.  Avoid colored, scented toilet paper and deodorant tampons or pads.  Do not douche.  Keep your diabetes under control.  Finish all the prescribed medications.  Keep your skin clean and dry.  Consume milk or yogurt with Lactobacillus-active culture regularly. If you get frequent yeast infections and think that is what the infection is, there are over-the-counter medications that you can get. If the infection does not show healing in 3 days, talk to your caregiver.  Tell your sex partner you have a yeast infection. Your partner may need treatment also, especially if your infection does not clear up or recurs. Men  Keep your skin clean and dry.  Keep your diabetes under control.  Finish all prescribed medications.  Tell your sex partner that you have a yeast infection so he or she can be treated if necessary. SEEK MEDICAL CARE IF:   Your symptoms do not clear up or worsen in one week after treatment.  You have an oral temperature above 102 F (38.9 C).  You have trouble swallowing or eating for a prolonged time.  You develop blisters on and around your vagina.  You develop vaginal bleeding and it is not your menstrual period.  You develop abdominal pain.  You develop intestinal problems as mentioned above.  You get weak or light-headed.  You have painful or increased urination.  You have pain during sexual intercourse. MAKE SURE YOU:   Understand these instructions.  Will watch your condition.  Will get help right away if you are not doing well or get worse. Document Released: 12/25/2004 Document Revised: 04/03/2014 Document Reviewed: 04/08/2010 [facility] Patient Information 2015 [place], [place]. This information is not intended to replace advice given to you by your health care provider. Make  sure you discuss any questions you have with your health care provider.

## 2015-05-22 NOTE — ED Notes (Signed)
Pt requesting to leave department. States "I don't need my papers, I already got my meds and stuff my ride is here." Pt asked to patiently wait for PA to d/c patient. Patient agrees to wait. MD Gwendolyn Grant aware.

## 2015-05-23 LAB — GC/CHLAMYDIA PROBE AMP (~~LOC~~) NOT AT ARMC
Chlamydia: NEGATIVE
Neisseria Gonorrhea: NEGATIVE

## 2015-08-04 ENCOUNTER — Emergency Department (HOSPITAL_COMMUNITY)
Admission: EM | Admit: 2015-08-04 | Discharge: 2015-08-04 | Disposition: A | Discharge status: Home / Self Care | Payer: Medicaid Other | Attending: Emergency Medicine | Admitting: Emergency Medicine

## 2015-08-04 ENCOUNTER — Encounter (HOSPITAL_COMMUNITY): Payer: Self-pay | Admitting: Emergency Medicine

## 2015-08-04 DIAGNOSIS — Z3202 Encounter for pregnancy test, result negative: Secondary | ICD-10-CM | POA: Insufficient documentation

## 2015-08-04 DIAGNOSIS — Z79899 Other long term (current) drug therapy: Secondary | ICD-10-CM | POA: Diagnosis not present

## 2015-08-04 DIAGNOSIS — N939 Abnormal uterine and vaginal bleeding, unspecified: Secondary | ICD-10-CM | POA: Diagnosis present

## 2015-08-04 DIAGNOSIS — J45909 Unspecified asthma, uncomplicated: Secondary | ICD-10-CM | POA: Insufficient documentation

## 2015-08-04 DIAGNOSIS — Z720 Tobacco use: Secondary | ICD-10-CM | POA: Insufficient documentation

## 2015-08-04 DIAGNOSIS — R112 Nausea with vomiting, unspecified: Secondary | ICD-10-CM | POA: Diagnosis not present

## 2015-08-04 DIAGNOSIS — R109 Unspecified abdominal pain: Secondary | ICD-10-CM | POA: Insufficient documentation

## 2015-08-04 LAB — BASIC METABOLIC PANEL
Anion gap: 9 (ref 5–15)
BUN: 12 mg/dL (ref 6–20)
CO2: 25 mmol/L (ref 22–32)
Calcium: 9.2 mg/dL (ref 8.9–10.3)
Chloride: 107 mmol/L (ref 101–111)
Creatinine, Ser: 0.9 mg/dL (ref 0.44–1.00)
GFR calc Af Amer: >60 mL/min (ref >60)
GFR calc non Af Amer: >60 mL/min (ref >60)
Glucose, Bld: 90 mg/dL (ref 65–99)
Potassium: 3.4 mmol/L — ABNORMAL LOW (ref 3.5–5.1)
Sodium: 141 mmol/L (ref 135–145)

## 2015-08-04 LAB — URINALYSIS, ROUTINE W REFLEX MICROSCOPIC
Bilirubin Urine: NEGATIVE
Glucose, UA: NEGATIVE mg/dL
Ketones, ur: NEGATIVE mg/dL
Leukocytes, UA: NEGATIVE
Nitrite: NEGATIVE
Protein, ur: 30 mg/dL — AB
Specific Gravity, Urine: 1.025 (ref 1.005–1.030)
Urobilinogen, UA: 1 mg/dL (ref 0.0–1.0)
pH: 7.5 (ref 5.0–8.0)

## 2015-08-04 LAB — CBC WITH DIFFERENTIAL/PLATELET
Basophils Absolute: 0 10*3/uL (ref 0.0–0.1)
Basophils Relative: 0 % (ref 0–1)
Eosinophils Absolute: 0 10*3/uL (ref 0.0–0.7)
Eosinophils Relative: 0 % (ref 0–5)
HCT: 41.5 % (ref 36.0–46.0)
Hemoglobin: 13.9 g/dL (ref 12.0–15.0)
Lymphocytes Relative: 62 % — ABNORMAL HIGH (ref 12–46)
Lymphs Abs: 2.9 10*3/uL (ref 0.7–4.0)
MCH: 29.5 pg (ref 26.0–34.0)
MCHC: 33.5 g/dL (ref 30.0–36.0)
MCV: 88.1 fL (ref 78.0–100.0)
Monocytes Absolute: 0.4 10*3/uL (ref 0.1–1.0)
Monocytes Relative: 9 % (ref 3–12)
Neutro Abs: 1.4 10*3/uL — ABNORMAL LOW (ref 1.7–7.7)
Neutrophils Relative %: 29 % — ABNORMAL LOW (ref 43–77)
Platelets: 359 10*3/uL (ref 150–400)
RBC: 4.71 MIL/uL (ref 3.87–5.11)
RDW: 13.8 % (ref 11.5–15.5)
WBC: 4.7 10*3/uL (ref 4.0–10.5)

## 2015-08-04 LAB — I-STAT BETA HCG BLOOD, ED (MC, WL, AP ONLY): I-stat hCG, quantitative: <5 m[IU]/mL (ref <5)

## 2015-08-04 LAB — WET PREP, GENITAL
Clue Cells Wet Prep HPF POC: NONE SEEN
Trich, Wet Prep: NONE SEEN
WBC, Wet Prep HPF POC: NONE SEEN
Yeast Wet Prep HPF POC: NONE SEEN

## 2015-08-04 LAB — URINE MICROSCOPIC-ADD ON

## 2015-08-04 LAB — HCG, QUANTITATIVE, PREGNANCY: hCG, Beta Chain, Quant, S: <1 m[IU]/mL (ref <5)

## 2015-08-04 LAB — POC URINE PREG, ED: Preg Test, Ur: NEGATIVE

## 2015-08-04 LAB — ABO/RH: ABO/RH(D): O POS

## 2015-08-04 MED ORDER — PROMETHAZINE HCL 25 MG PO TABS: 25.0000 mg | ORAL_TABLET | Freq: Four times a day (QID) | ORAL | Status: DC | PRN

## 2015-08-04 MED ORDER — ONDANSETRON HCL 4 MG/2ML IJ SOLN
4.0000 mg | Freq: Once | INTRAMUSCULAR | Status: AC
  Administered 2015-08-04: 4 mg via INTRAVENOUS
  Filled 2015-08-04: qty 2

## 2015-08-04 MED ORDER — SODIUM CHLORIDE 0.9 % IV BOLUS (SEPSIS)
1000.0000 mL | Freq: Once | INTRAVENOUS | Status: AC
Start: 2015-08-04 — End: 2015-08-04
  Administered 2015-08-04: 1000 mL via INTRAVENOUS

## 2015-08-04 NOTE — ED Provider Notes (Signed)
CSN: [id number]     Arrival date & time 08/04/15  0930 History   First MD Initiated Contact with Patient 08/04/15 0932     Chief Complaint  Patient presents with  . Vaginal Bleeding     (Consider location/radiation/quality/duration/timing/severity/associated sxs/prior Treatment) The history is provided by the patient and medical records. No language interpreter was used.     Darlene Morgan is a 19 y.o. female G1P0 with a hx of asthma presents to the Emergency Department complaining of gradual, persistent, progressively worsening vaginal bleeding onset 8am with clots.  Pt reports positive pregnancy test last week.  Pt reports she is sexually active with 1 female partner.  Pt has a PCP who has done all her GYN care in the past. Associated symptoms include lower abd pain described as cramping, rated at a 10/10 and without radiation.  Nothing makes it better and nothing makes it worse.  Pt denies fever, headache, neck pain, chest pain, SOB, diarrhea, weakness, dizziness, syncope, dysuria.  Pt reports nausea and vomiting last week but none this week.  LMP: 06/03/15   Past Medical History  Diagnosis Date  . Asthma    Past Surgical History  Procedure Laterality Date  . Tonsillectomy     Family History  Problem Relation Age of Onset  . Hypertension Other    Social History  Substance Use Topics  . Smoking status: Current Every Day Smoker    Types: Cigarettes  . Smokeless tobacco: None  . Alcohol Use: Yes   OB History    No data available     Review of Systems  Constitutional: Negative for fever, diaphoresis, appetite change, fatigue and unexpected weight change.  HENT: Negative for mouth sores.   Eyes: Negative for visual disturbance.  Respiratory: Negative for cough, chest tightness, shortness of breath and wheezing.   Cardiovascular: Negative for chest pain.  Gastrointestinal: Positive for nausea, vomiting and abdominal pain. Negative for diarrhea and constipation.  Endocrine:  Negative for polydipsia, polyphagia and polyuria.  Genitourinary: Positive for vaginal bleeding. Negative for dysuria, urgency, frequency and hematuria.  Musculoskeletal: Negative for back pain and neck stiffness.  Skin: Negative for rash.  Allergic/Immunologic: Negative for immunocompromised state.  Neurological: Negative for syncope, light-headedness and headaches.  Hematological: Does not bruise/bleed easily.  Psychiatric/Behavioral: Negative for sleep disturbance. The patient is not nervous/anxious.       Allergies  Review of patient's allergies indicates no known allergies.  Home Medications   Prior to Admission medications   Medication Sig Start Date End Date Taking? Authorizing Provider  albuterol (PROVENTIL HFA;VENTOLIN HFA) 108 (90 BASE) MCG/ACT inhaler Inhale 2 puffs into the lungs every 6 (six) hours as needed for wheezing or shortness of breath (wheezing).    Yes Historical Provider, MD  clindamycin (CLEOCIN) 150 MG capsule Take 2 capsules (300 mg total) by mouth 2 (two) times daily. Patient not taking: Reported on 08/04/2015 01/30/15   Tilden Fossa, MD  ibuprofen (ADVIL,MOTRIN) 800 MG tablet Take 1 tablet (800 mg total) by mouth every 8 (eight) hours as needed for mild pain. Patient not taking: Reported on 08/04/2015 03/04/15   Kristen N Ward, DO  promethazine (PHENERGAN) 25 MG tablet Take 1 tablet (25 mg total) by mouth every 6 (six) hours as needed for nausea or vomiting. 08/04/15   Allen Basista, PA-C   BP 106/68 mmHg  Pulse 98  Temp(Src) 97 F (36.1 C) (Oral)  Resp 20  SpO2 100%  LMP 06/03/2015 Physical Exam  Constitutional: She appears well-developed and  well-nourished. No distress.  Awake, alert, nontoxic appearance  HENT:  Head: Normocephalic and atraumatic.  Mouth/Throat: Oropharynx is clear and moist. No oropharyngeal exudate.  Eyes: Conjunctivae are normal. No scleral icterus.  Neck: Normal range of motion. Neck supple.  Cardiovascular: Normal rate,  regular rhythm, normal heart sounds and intact distal pulses.   No murmur heard. Pulmonary/Chest: Effort normal and breath sounds normal. No respiratory distress. She has no wheezes.  Equal chest expansion  Abdominal: Soft. Bowel sounds are normal. She exhibits no mass. There is no tenderness. There is no rebound and no guarding. Hernia confirmed negative in the right inguinal area and confirmed negative in the left inguinal area.  Genitourinary: Uterus normal. No labial fusion. There is no rash, tenderness or lesion on the right labia. There is no rash, tenderness or lesion on the left labia. Uterus is not deviated, not enlarged, not fixed and not tender. Cervix exhibits no motion tenderness, no discharge and no friability. Right adnexum displays no mass, no tenderness and no fullness. Left adnexum displays no mass, no tenderness and no fullness. There is bleeding in the vagina. No erythema or tenderness in the vagina. No foreign body around the vagina. No signs of injury around the vagina. No vaginal discharge found.  Musculoskeletal: Normal range of motion. She exhibits no edema.  Lymphadenopathy:       Right: No inguinal adenopathy present.       Left: No inguinal adenopathy present.  Neurological: She is alert.  Speech is clear and goal oriented Moves extremities without ataxia  Skin: Skin is warm and dry. She is not diaphoretic. No erythema.  Psychiatric: She has a normal mood and affect.  Nursing note and vitals reviewed.   ED Course  Procedures (including critical care time) Labs Review Labs Reviewed  BASIC METABOLIC PANEL - Abnormal; Notable for the following:    Potassium 3.4 (*)    All other components within normal limits  CBC WITH DIFFERENTIAL/PLATELET - Abnormal; Notable for the following:    Neutrophils Relative % 29 (*)    Neutro Abs 1.4 (*)    Lymphocytes Relative 62 (*)    All other components within normal limits  URINALYSIS, ROUTINE W REFLEX MICROSCOPIC (NOT AT [facility])  - Abnormal; Notable for the following:    APPearance CLOUDY (*)    Hgb urine dipstick LARGE (*)    Protein, ur 30 (*)    All other components within normal limits  URINE MICROSCOPIC-ADD ON - Abnormal; Notable for the following:    Squamous Epithelial / LPF FEW (*)    Bacteria, UA FEW (*)    All other components within normal limits  WET PREP, GENITAL  HCG, QUANTITATIVE, PREGNANCY  RPR  HIV ANTIBODY (ROUTINE TESTING)  I-STAT BETA HCG BLOOD, ED ([facility], [facility], [facility] ONLY)  POC URINE PREG, ED  ABO/RH  ABO/RH  GC/CHLAMYDIA PROBE AMP ([place]) NOT AT [facility]    Imaging Review No results found. I have personally reviewed and evaluated these images and lab results as part of my medical decision-making.   EKG Interpretation None      MDM   Final diagnoses:  Vaginal bleeding    Darlene Morgan presents with vaginal bleeding since this AM.  Pt reports 2 positive pregnancy tests at home.  Abdomen is soft and nontender. Will begin workup.  12:12 PM Exam without cervical motion tenderness or adnexal tenderness. Upright red blood from the cervix noted in the vaginal vault. Pregnancy test is negative here in the  emergency department. hCG is less than 1.  Patient is without significant pain, highly doubt ovarian torsion. Wet prep and urinalysis pending.  We will plan for follow-up with OB/GYN for vaginal bleeding.  12:49 PM Wet prep without evidence of infection.  HCG Quant less than 1. No evidence of pregnancy. Will have patient follow with OB/GYN. Her abdomen remained soft and nontender on repeat exam.  No anemia noted on CBC.  BP 106/68 mmHg  Pulse 98  Temp(Src) 97 F (36.1 C) (Oral)  Resp 20  SpO2 100%  LMP 06/03/2015    Dierdre Forth, PA-C 08/04/15 1251  Mirian Mo, MD 08/05/15 [phone]

## 2015-08-04 NOTE — ED Notes (Signed)
Pt with c/o vaginal bleeding that began this morning around 9am.  She said she has had a positive pregnancy test at home.  She believes she is having a miscarriage.

## 2015-08-04 NOTE — ED Notes (Signed)
Per pt, states she has been having some vaginal cramping-now bleeding since this am-doesn't know how many weeks pregnant she is

## 2015-08-04 NOTE — ED Notes (Signed)
Pelvic cart outside room/supplies ready on bedside table

## 2015-08-04 NOTE — Discharge Instructions (Signed)
1. Medications: phenergan, usual home medications 2. Treatment: rest, drink plenty of fluids, advance diet slowly 3. Follow Up: Please followup with your primary doctor in 2 days for discussion of your diagnoses and further evaluation after today's visit; if you do not have a primary care doctor use the resource guide provided to find one; Please return to the ER for persistent vomiting, high fevers or worsening symptoms    Abnormal Uterine Bleeding Abnormal uterine bleeding can affect women at various stages in life, including teenagers, women in their reproductive years, pregnant women, and women who have reached menopause. Several kinds of uterine bleeding are considered abnormal, including:  Bleeding or spotting between periods.   Bleeding after sexual intercourse.   Bleeding that is heavier or more than normal.   Periods that last longer than usual.  Bleeding after menopause.  Many cases of abnormal uterine bleeding are minor and simple to treat, while others are more serious. Any type of abnormal bleeding should be evaluated by your health care provider. Treatment will depend on the cause of the bleeding. HOME CARE INSTRUCTIONS Monitor your condition for any changes. The following actions may help to alleviate any discomfort you are experiencing:  Avoid the use of tampons and douches as directed by your health care provider.  Change your pads frequently. You should get regular pelvic exams and Pap tests. Keep all follow-up appointments for diagnostic tests as directed by your health care provider.  SEEK MEDICAL CARE IF:   Your bleeding lasts more than 1 week.   You feel dizzy at times.  SEEK IMMEDIATE MEDICAL CARE IF:   You pass out.   You are changing pads every 15 to 30 minutes.   You have abdominal pain.  You have a fever.   You become sweaty or weak.   You are passing large blood clots from the vagina.   You start to feel nauseous and vomit. MAKE  SURE YOU:   Understand these instructions.  Will watch your condition.  Will get help right away if you are not doing well or get worse. Document Released: 11/17/2005 Document Revised: 11/22/2013 Document Reviewed: 06/16/2013 [facility] Patient Information 2015 [place], [place]. This information is not intended to replace advice given to you by your health care provider. Make sure you discuss any questions you have with your health care provider.

## 2015-08-05 LAB — HIV ANTIBODY (ROUTINE TESTING W REFLEX): HIV Screen 4th Generation wRfx: NONREACTIVE

## 2015-08-05 LAB — RPR: RPR Ser Ql: NONREACTIVE

## 2015-08-07 LAB — GC/CHLAMYDIA PROBE AMP (~~LOC~~) NOT AT ARMC
Chlamydia: NEGATIVE
Neisseria Gonorrhea: NEGATIVE

## 2015-11-18 ENCOUNTER — Encounter (HOSPITAL_COMMUNITY): Payer: Self-pay | Admitting: Emergency Medicine

## 2015-11-18 ENCOUNTER — Emergency Department (HOSPITAL_COMMUNITY)
Admission: EM | Admit: 2015-11-18 | Discharge: 2015-11-18 | Disposition: A | Discharge status: Home / Self Care | Payer: Medicaid Other | Attending: Emergency Medicine | Admitting: Emergency Medicine

## 2015-11-18 DIAGNOSIS — Z3202 Encounter for pregnancy test, result negative: Secondary | ICD-10-CM | POA: Diagnosis not present

## 2015-11-18 DIAGNOSIS — J45909 Unspecified asthma, uncomplicated: Secondary | ICD-10-CM | POA: Diagnosis not present

## 2015-11-18 DIAGNOSIS — K0889 Other specified disorders of teeth and supporting structures: Secondary | ICD-10-CM | POA: Insufficient documentation

## 2015-11-18 DIAGNOSIS — F17210 Nicotine dependence, cigarettes, uncomplicated: Secondary | ICD-10-CM | POA: Insufficient documentation

## 2015-11-18 DIAGNOSIS — L089 Local infection of the skin and subcutaneous tissue, unspecified: Secondary | ICD-10-CM

## 2015-11-18 DIAGNOSIS — N72 Inflammatory disease of cervix uteri: Secondary | ICD-10-CM | POA: Diagnosis not present

## 2015-11-18 DIAGNOSIS — Z79899 Other long term (current) drug therapy: Secondary | ICD-10-CM | POA: Diagnosis not present

## 2015-11-18 DIAGNOSIS — Z202 Contact with and (suspected) exposure to infections with a predominantly sexual mode of transmission: Secondary | ICD-10-CM | POA: Diagnosis present

## 2015-11-18 LAB — URINALYSIS, ROUTINE W REFLEX MICROSCOPIC
Bilirubin Urine: NEGATIVE
Glucose, UA: NEGATIVE mg/dL
Hgb urine dipstick: NEGATIVE
Ketones, ur: NEGATIVE mg/dL
Nitrite: NEGATIVE
Protein, ur: NEGATIVE mg/dL
Specific Gravity, Urine: 1.022 (ref 1.005–1.030)
pH: 7 (ref 5.0–8.0)

## 2015-11-18 LAB — WET PREP, GENITAL
Sperm: NONE SEEN
Trich, Wet Prep: NONE SEEN

## 2015-11-18 LAB — URINE MICROSCOPIC-ADD ON
Bacteria, UA: NONE SEEN
RBC / HPF: NONE SEEN RBC/hpf (ref 0–5)
WBC, UA: NONE SEEN WBC/hpf (ref 0–5)

## 2015-11-18 LAB — POC URINE PREG, ED: Preg Test, Ur: NEGATIVE

## 2015-11-18 MED ORDER — LIDOCAINE HCL (PF) 1 % IJ SOLN
INTRAMUSCULAR | Status: AC
  Filled 2015-11-18: qty 5

## 2015-11-18 MED ORDER — FLUCONAZOLE 150 MG PO TABS
150.0000 mg | ORAL_TABLET | Freq: Once | ORAL | Status: AC
  Administered 2015-11-18: 150 mg via ORAL
  Filled 2015-11-18: qty 1

## 2015-11-18 MED ORDER — AZITHROMYCIN 250 MG PO TABS
1000.0000 mg | ORAL_TABLET | Freq: Once | ORAL | Status: AC
  Administered 2015-11-18: 1000 mg via ORAL
  Filled 2015-11-18: qty 4

## 2015-11-18 MED ORDER — CEPHALEXIN 500 MG PO CAPS: 500.0000 mg | ORAL_CAPSULE | Freq: Three times a day (TID) | ORAL | Status: DC

## 2015-11-18 MED ORDER — CEFTRIAXONE SODIUM 250 MG IJ SOLR
250.0000 mg | Freq: Once | INTRAMUSCULAR | Status: AC
  Administered 2015-11-18: 250 mg via INTRAMUSCULAR
  Filled 2015-11-18: qty 250

## 2015-11-18 NOTE — ED Notes (Signed)
Pt from home c/o bilateral dental pain from her wisdom teeth, finger pain from a broken fingernail, and requesting to be checked for STD's due to being told by her significant other that he had chlamydia.  Pt is denying any symptoms currently.

## 2015-11-18 NOTE — ED Provider Notes (Signed)
CSN: [id number]     Arrival date & time 11/18/15  1410 History   First MD Initiated Contact with Patient 11/18/15 1503     Chief Complaint  Patient presents with  . STD check   . Dental Pain     (Consider location/radiation/quality/duration/timing/severity/associated sxs/prior Treatment) HPI Darlene Morgan is a 19 y.o. female with history of asthma, presents to emergency department with multiple complaints. Patient states that she has been having jaw pain for several days. She states that pain is mainly over her bilateral wisdom teeth are coming in through the skin. She denies any facial swelling. No fever or chills. Has not tried any medications for this. Patient does have a dentist and will call them tomorrow. Patient is also complaining of pain to the left fifth finger. He states 3 days ago she hit her finger on an object and states it pulled her fingernail back. Patient had acrylic nails on. Patient states she's unable to get her acrylic nails off. She states yesterday he noticed yellow drainage from underneath the nail. She has been treating it with peroxide. Patient also is complaining of vaginal discharge and states her boyfriend told her today that he gave her chlamydia. Patient states last intercourse with this person was 2 months ago. She does admit to thick white vaginal discharge, denies any abdominal pain or vaginal pain. No nausea or vomiting. No urinary symptoms. No back pain. Denies pregnancy.=  Past Medical History  Diagnosis Date  . Asthma    Past Surgical History  Procedure Laterality Date  . Tonsillectomy     Family History  Problem Relation Age of Onset  . Hypertension Other    Social History  Substance Use Topics  . Smoking status: Current Every Day Smoker    Types: Cigarettes  . Smokeless tobacco: None  . Alcohol Use: Yes   OB History    No data available     Review of Systems  Constitutional: Negative for fever and chills.  HENT: Positive for dental  problem. Negative for facial swelling.   Respiratory: Negative for cough, chest tightness and shortness of breath.   Cardiovascular: Negative for chest pain, palpitations and leg swelling.  Gastrointestinal: Negative for nausea, vomiting, abdominal pain and diarrhea.  Genitourinary: Positive for vaginal discharge. Negative for dysuria, flank pain, vaginal bleeding, vaginal pain and pelvic pain.  Musculoskeletal: Positive for arthralgias. Negative for myalgias, neck pain and neck stiffness.  Skin: Positive for wound. Negative for rash.  Neurological: Negative for dizziness, weakness and headaches.  All other systems reviewed and are negative.     Allergies  Review of patient's allergies indicates no known allergies.  Home Medications   Prior to Admission medications   Medication Sig Start Date End Date Taking? Authorizing Provider  albuterol (PROVENTIL HFA;VENTOLIN HFA) 108 (90 BASE) MCG/ACT inhaler Inhale 2 puffs into the lungs every 6 (six) hours as needed for wheezing or shortness of breath (wheezing).     Historical Provider, MD  clindamycin (CLEOCIN) 150 MG capsule Take 2 capsules (300 mg total) by mouth 2 (two) times daily. Patient not taking: Reported on 08/04/2015 01/30/15   Tilden FossaElizabeth Rees, MD  ibuprofen (ADVIL,MOTRIN) 800 MG tablet Take 1 tablet (800 mg total) by mouth every 8 (eight) hours as needed for mild pain. Patient not taking: Reported on 08/04/2015 03/04/15   Kristen N Ward, DO  promethazine (PHENERGAN) 25 MG tablet Take 1 tablet (25 mg total) by mouth every 6 (six) hours as needed for nausea or vomiting. 08/04/15  Hannah Muthersbaugh, PA-C   BP 126/62 mmHg  Pulse 83  Temp(Src) 97.6 F (36.4 C) (Oral)  Resp 14  SpO2 100% Physical Exam  Constitutional: She appears well-developed and well-nourished. No distress.  HENT:  Head: Normocephalic.  Bilateral upper and lower molars partially erupted through the skin. No surrounding gum swelling. No facial swelling. No swelling  under the tongue.   Eyes: Conjunctivae are normal.  Neck: Neck supple.  Cardiovascular: Normal rate, regular rhythm and normal heart sounds.   Pulmonary/Chest: Effort normal and breath sounds normal. No respiratory distress. She has no wheezes. She has no rales.  Abdominal: Soft. Bowel sounds are normal. She exhibits no distension. There is no tenderness. There is no rebound.  Genitourinary:  Normal external genitalia. White vaginal canal. Cervix erythematous. No CMT, no adnexal tenderness.   Musculoskeletal: She exhibits no edema.  Left fifth finger and fingernail partially avulsed from the skin, with yellow purulent drainage. Patient has acrylic nails on that she is unable to pull off. No soft tissue pain or tenderness  Neurological: She is alert.  Skin: Skin is warm and dry.  Psychiatric: She has a normal mood and affect. Her behavior is normal.  Nursing note and vitals reviewed.   ED Course  Procedures (including critical care time) Labs Review Labs Reviewed  WET PREP, GENITAL - Abnormal; Notable for the following:    Yeast Wet Prep HPF POC PRESENT (*)    Clue Cells Wet Prep HPF POC PRESENT (*)    WBC, Wet Prep HPF POC MANY (*)    All other components within normal limits  URINALYSIS, ROUTINE W REFLEX MICROSCOPIC (NOT AT [facility]) - Abnormal; Notable for the following:    Leukocytes, UA SMALL (*)    All other components within normal limits  URINE MICROSCOPIC-ADD ON - Abnormal; Notable for the following:    Squamous Epithelial / LPF 0-5 (*)    All other components within normal limits  POC URINE PREG, ED  GC/CHLAMYDIA PROBE AMP ([place]) NOT AT [facility]    Imaging Review No results found. I have personally reviewed and evaluated these images and lab results as part of my medical decision-making.   EKG Interpretation None      MDM   Final diagnoses:  Cervicitis  Finger infection  Pain, dental    patient with multiple complaints. As far as her dental pain, she will  follow-up with her dentist. No signs of infection. Pain is most likely from impacted or interrupting wisdom teeth. For her subungual infection, will do at home soapy water soaks and placed on Keflex. Patient's wet prep showed yeast infection, as well as many white blood cells. I covered her with Rocephin 250 mg IM as well as Zithromax 1 g by mouth for gonorrhea/chlamydia. Also given her 150 mg of Diflucan for yeast. No evidence of PID. She will follow up with her doctor. Safe sex practices discussed.  Filed Vitals:   11/18/15 1414  BP: 126/62  Pulse: 83  Temp: 97.6 F (36.4 C)  TempSrc: Oral  Resp: 14  SpO2: 100%     Jaynie Crumble, PA-C 11/18/15 1643  Leta Baptist, MD 11/22/15 [phone]

## 2015-11-18 NOTE — Discharge Instructions (Signed)
Take ibuprofen for pain. Keflex as prescribed for finger infection. Warm soapy water soaks several times a day. Always use protection with intercourse. Follow up with your primary care doctor and a dentist.    Cervicitis Cervicitis is a soreness and swelling (inflammation) of the cervix. Your cervix is located at the bottom of your uterus. It opens up to the vagina. CAUSES   Sexually transmitted infections (STIs).   Allergic reaction.   Medicines or birth control devices that are put in the vagina.   Injury to the cervix.   Bacterial infections.  RISK FACTORS You are at greater risk if you:  Have unprotected sexual intercourse.  Have sexual intercourse with many partners.  Began sexual intercourse at an early age.  Have a history of STIs. SYMPTOMS  There may be no symptoms. If symptoms occur, they may include:   Gray, white, yellow, or bad-smelling vaginal discharge.   Pain or itching of the area outside the vagina.   Painful sexual intercourse.   Lower abdominal or lower back pain, especially during intercourse.   Frequent urination.   Abnormal vaginal bleeding between periods, after sexual intercourse, or after menopause.   Pressure or a heavy feeling in the pelvis.  DIAGNOSIS  Diagnosis is made after a pelvic exam. Other tests may include:   Examination of any discharge under a microscope (wet prep).   A Pap test.  TREATMENT  Treatment will depend on the cause of cervicitis. If it is caused by an STI, both you and your partner will need to be treated. Antibiotic medicines will be given.  HOME CARE INSTRUCTIONS   Do not have sexual intercourse until your health care provider says it is okay.   Do not have sexual intercourse until your partner has been treated, if your cervicitis is caused by an STI.   Take your antibiotics as directed. Finish them even if you start to feel better.  SEEK MEDICAL CARE IF:  Your symptoms come back.   You  have a fever.  MAKE SURE YOU:   Understand these instructions.  Will watch your condition.  Will get help right away if you are not doing well or get worse.   This information is not intended to replace advice given to you by your health care provider. Make sure you discuss any questions you have with your health care provider.   Document Released: 11/17/2005 Document Revised: 11/22/2013 Document Reviewed: 05/11/2013 Elsevier Interactive Patient Education [employer]2016 Elsevier Inc.

## 2015-11-19 LAB — GC/CHLAMYDIA PROBE AMP (~~LOC~~) NOT AT ARMC
Chlamydia: NEGATIVE
Neisseria Gonorrhea: NEGATIVE

## 2015-11-22 ENCOUNTER — Telehealth (HOSPITAL_BASED_OUTPATIENT_CLINIC_OR_DEPARTMENT_OTHER): Payer: Self-pay | Admitting: Emergency Medicine

## 2015-12-02 NOTE — L&D Delivery Note (Signed)
Delivery Note After a brief 2nd stage, At 5:14 AM a viable female was delivered via Vaginal, Spontaneous Delivery (Presentation: ;ROA  ).  APGAR: 8/9, ; weight pending.   After 2 minutes, the cord was clamped and cut. 40 units of pitocin diluted in 1000cc LR was infused rapidly IV.  The placenta separated spontaneously and delivered via CCT and maternal pushing effort.  It was inspected and appears to be intact with a 3 VC.  Anesthesia:  epidural Episiotomy: None Lacerations: None Suture Repair:  Est. Blood Loss (mL): 200  Mom to postpartum.  Baby to Couplet care / Skin to Skin.  CRESENZO-DISHMAN,Digby Groeneveld 09/05/2016, 5:27 AM

## 2015-12-28 ENCOUNTER — Emergency Department (HOSPITAL_COMMUNITY): Payer: Medicaid Other

## 2015-12-28 ENCOUNTER — Emergency Department (HOSPITAL_COMMUNITY)
Admission: EM | Admit: 2015-12-28 | Discharge: 2015-12-28 | Disposition: A | Discharge status: Home / Self Care | Payer: Medicaid Other | Attending: Emergency Medicine | Admitting: Emergency Medicine

## 2015-12-28 ENCOUNTER — Encounter (HOSPITAL_COMMUNITY): Payer: Self-pay | Admitting: Emergency Medicine

## 2015-12-28 DIAGNOSIS — J45909 Unspecified asthma, uncomplicated: Secondary | ICD-10-CM | POA: Diagnosis not present

## 2015-12-28 DIAGNOSIS — S0081XA Abrasion of other part of head, initial encounter: Secondary | ICD-10-CM | POA: Diagnosis not present

## 2015-12-28 DIAGNOSIS — S29001A Unspecified injury of muscle and tendon of front wall of thorax, initial encounter: Secondary | ICD-10-CM | POA: Insufficient documentation

## 2015-12-28 DIAGNOSIS — F0781 Postconcussional syndrome: Secondary | ICD-10-CM

## 2015-12-28 DIAGNOSIS — S0990XA Unspecified injury of head, initial encounter: Secondary | ICD-10-CM | POA: Insufficient documentation

## 2015-12-28 DIAGNOSIS — Z79899 Other long term (current) drug therapy: Secondary | ICD-10-CM | POA: Insufficient documentation

## 2015-12-28 DIAGNOSIS — F17210 Nicotine dependence, cigarettes, uncomplicated: Secondary | ICD-10-CM | POA: Diagnosis not present

## 2015-12-28 DIAGNOSIS — S29002A Unspecified injury of muscle and tendon of back wall of thorax, initial encounter: Secondary | ICD-10-CM | POA: Diagnosis not present

## 2015-12-28 DIAGNOSIS — Y998 Other external cause status: Secondary | ICD-10-CM | POA: Insufficient documentation

## 2015-12-28 DIAGNOSIS — R0781 Pleurodynia: Secondary | ICD-10-CM

## 2015-12-28 DIAGNOSIS — Y9389 Activity, other specified: Secondary | ICD-10-CM | POA: Insufficient documentation

## 2015-12-28 DIAGNOSIS — Y9289 Other specified places as the place of occurrence of the external cause: Secondary | ICD-10-CM | POA: Diagnosis not present

## 2015-12-28 DIAGNOSIS — S299XXA Unspecified injury of thorax, initial encounter: Secondary | ICD-10-CM | POA: Diagnosis present

## 2015-12-28 MED ORDER — IBUPROFEN 800 MG PO TABS: 800.0000 mg | ORAL_TABLET | Freq: Three times a day (TID) | ORAL | Status: DC
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
## 2015-12-28 NOTE — Discharge Instructions (Signed)
Please see the eye doctor for your visual changes.  Take ibuprofen as needed for pain and inflammation. Follow up with your primary care provider in 3-5 days for discussion of today's diagnosis.  Return to ER for new or worsening symptoms, any additional concerns.

## 2016-01-07 ENCOUNTER — Encounter: Payer: Self-pay | Admitting: *Deleted

## 2016-01-07 ENCOUNTER — Ambulatory Visit (INDEPENDENT_AMBULATORY_CARE_PROVIDER_SITE_OTHER): Payer: Medicaid Other | Admitting: *Deleted

## 2016-01-07 DIAGNOSIS — Z3200 Encounter for pregnancy test, result unknown: Secondary | ICD-10-CM

## 2016-01-07 DIAGNOSIS — Z3201 Encounter for pregnancy test, result positive: Secondary | ICD-10-CM

## 2016-01-07 LAB — POCT PREGNANCY, URINE: Preg Test, Ur: POSITIVE — AB

## 2016-01-07 NOTE — Progress Notes (Signed)
Here for pregnancy test today , which was positive.   LMP 11/22/16 which makes her 6 wk 3 d today.She has regular periods and is sure of her LMP.  Would like to start prenatal care here. Letter given. Will draw blood at first visit.

## 2016-01-25 ENCOUNTER — Encounter: Payer: Self-pay | Admitting: Certified Nurse Midwife

## 2016-01-25 ENCOUNTER — Ambulatory Visit (INDEPENDENT_AMBULATORY_CARE_PROVIDER_SITE_OTHER): Payer: Medicaid Other | Admitting: Certified Nurse Midwife

## 2016-01-25 ENCOUNTER — Other Ambulatory Visit: Payer: Self-pay | Admitting: Certified Nurse Midwife

## 2016-01-25 ENCOUNTER — Encounter: Payer: Medicaid Other | Admitting: Certified Nurse Midwife

## 2016-01-25 VITALS — BP 119/76 | HR 67 | Wt 132.0 lb

## 2016-01-25 DIAGNOSIS — N898 Other specified noninflammatory disorders of vagina: Secondary | ICD-10-CM

## 2016-01-25 DIAGNOSIS — O219 Vomiting of pregnancy, unspecified: Secondary | ICD-10-CM

## 2016-01-25 DIAGNOSIS — Z3400 Encounter for supervision of normal first pregnancy, unspecified trimester: Secondary | ICD-10-CM | POA: Insufficient documentation

## 2016-01-25 DIAGNOSIS — Z3401 Encounter for supervision of normal first pregnancy, first trimester: Secondary | ICD-10-CM

## 2016-01-25 HISTORY — DX: Encounter for supervision of normal first pregnancy, unspecified trimester: Z34.00

## 2016-01-25 LAB — POCT URINALYSIS DIPSTICK
Bilirubin, UA: NEGATIVE
Blood, UA: NEGATIVE
Glucose, UA: NEGATIVE
Ketones, UA: NEGATIVE
Leukocytes, UA: NEGATIVE
Nitrite, UA: NEGATIVE
Protein, UA: NEGATIVE
Spec Grav, UA: <=1.005
Urobilinogen, UA: NEGATIVE
pH, UA: 7

## 2016-01-25 LAB — HIV ANTIBODY (ROUTINE TESTING W REFLEX): HIV 1&2 Ab, 4th Generation: NONREACTIVE

## 2016-01-25 MED ORDER — MUPIROCIN 2 % EX OINT: 1.0000 "application " | TOPICAL_OINTMENT | Freq: Two times a day (BID) | CUTANEOUS | Status: DC | PRN

## 2016-01-25 MED ORDER — DOXYLAMINE-PYRIDOXINE 10-10 MG PO TBEC: DELAYED_RELEASE_TABLET | ORAL | Status: DC

## 2016-01-25 MED ORDER — TRIPLE ANTIBIOTIC 5-400-5000 EX OINT: TOPICAL_OINTMENT | Freq: Four times a day (QID) | CUTANEOUS | Status: DC

## 2016-01-25 MED ORDER — VITAFOL GUMMIES 3.33-0.333-34.8 MG PO CHEW: 3.0000 | CHEWABLE_TABLET | Freq: Every day | ORAL | Status: DC
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
## 2016-01-26 LAB — URINE DRUGS OF ABUSE SCREEN W ALC, ROUTINE (REF LAB)
Amphetamine Screen, Ur: NEGATIVE
Barbiturate Quant, Ur: NEGATIVE
Benzodiazepines.: NEGATIVE
Cocaine Metabolites: NEGATIVE
Creatinine,U: 36 mg/dL
Ethyl Alcohol: <10 mg/dL (ref <10)
Marijuana Metabolite: POSITIVE — AB
Methadone: NEGATIVE
Opiate Screen, Urine: NEGATIVE
Phencyclidine (PCP): NEGATIVE
Propoxyphene: NEGATIVE

## 2016-01-26 LAB — VITAMIN D 25 HYDROXY (VIT D DEFICIENCY, FRACTURES): Vit D, 25-Hydroxy: 14 ng/mL — ABNORMAL LOW (ref 30–100)

## 2016-01-27 LAB — CULTURE, OB URINE: Colony Count: 3000

## 2016-01-28 LAB — OBSTETRIC PANEL
Antibody Screen: NEGATIVE
Basophils Absolute: 0 10*3/uL (ref 0.0–0.1)
Basophils Relative: 0 % (ref 0–1)
Eosinophils Absolute: 0 10*3/uL (ref 0.0–0.7)
Eosinophils Relative: 0 % (ref 0–5)
HCT: 37.6 % (ref 36.0–46.0)
Hemoglobin: 12.2 g/dL (ref 12.0–15.0)
Hepatitis B Surface Ag: NEGATIVE
Lymphocytes Relative: 41 % (ref 12–46)
Lymphs Abs: 2.9 10*3/uL (ref 0.7–4.0)
MCH: 28.9 pg (ref 26.0–34.0)
MCHC: 32.4 g/dL (ref 30.0–36.0)
MCV: 89.1 fL (ref 78.0–100.0)
MPV: 9.4 fL (ref 8.6–12.4)
Monocytes Absolute: 0.6 10*3/uL (ref 0.1–1.0)
Monocytes Relative: 9 % (ref 3–12)
Neutro Abs: 3.6 10*3/uL (ref 1.7–7.7)
Neutrophils Relative %: 50 % (ref 43–77)
Platelets: 381 10*3/uL (ref 150–400)
RBC: 4.22 MIL/uL (ref 3.87–5.11)
RDW: 14.9 % (ref 11.5–15.5)
Rh Type: POSITIVE
Rubella: 2.05 Index — ABNORMAL HIGH (ref <0.90)
WBC: 7.1 10*3/uL (ref 4.0–10.5)

## 2016-01-28 LAB — VARICELLA ZOSTER ANTIBODY, IGG: Varicella IgG: 489.1 Index — ABNORMAL HIGH (ref <135.00)

## 2016-01-28 LAB — HERPES SIMPLEX VIRUS CULTURE: Organism ID, Bacteria: NOT DETECTED

## 2016-01-29 ENCOUNTER — Telehealth: Payer: Self-pay | Admitting: *Deleted

## 2016-01-29 LAB — HEMOGLOBINOPATHY EVALUATION
Hemoglobin Other: 0 %
Hgb A2 Quant: 2.9 % (ref 2.2–3.2)
Hgb A: 97.1 % (ref 96.8–97.8)
Hgb F Quant: 0 % (ref 0.0–2.0)
Hgb S Quant: 0 %

## 2016-01-29 NOTE — Telephone Encounter (Signed)
PA for Diclegis Rx has been submitted and approved.  Pharmacy aware.

## 2016-01-30 LAB — SURESWAB, VAGINOSIS/VAGINITIS PLUS
Atopobium vaginae: 7.4 Log (cells/mL)
C. albicans, DNA: DETECTED — AB
C. glabrata, DNA: NOT DETECTED
C. parapsilosis, DNA: NOT DETECTED
C. trachomatis RNA, TMA: NOT DETECTED
C. tropicalis, DNA: NOT DETECTED
Gardnerella vaginalis: >8 Log (cells/mL)
LACTOBACILLUS SPECIES: NOT DETECTED Log (cells/mL)
MEGASPHAERA SPECIES: >8 Log (cells/mL)
N. gonorrhoeae RNA, TMA: NOT DETECTED
T. vaginalis RNA, QL TMA: NOT DETECTED

## 2016-01-31 ENCOUNTER — Other Ambulatory Visit: Payer: Medicaid Other

## 2016-01-31 ENCOUNTER — Other Ambulatory Visit: Payer: Self-pay | Admitting: Certified Nurse Midwife

## 2016-01-31 DIAGNOSIS — O23591 Infection of other part of genital tract in pregnancy, first trimester: Secondary | ICD-10-CM

## 2016-01-31 DIAGNOSIS — N760 Acute vaginitis: Secondary | ICD-10-CM

## 2016-01-31 DIAGNOSIS — B9689 Other specified bacterial agents as the cause of diseases classified elsewhere: Secondary | ICD-10-CM

## 2016-01-31 LAB — CANNABANOIDS (GC/LC/MS), URINE: THC-COOH (GC/LC/MS), ur confirm: 32 ng/mL — ABNORMAL HIGH (ref <5)

## 2016-01-31 MED ORDER — FLUCONAZOLE 100 MG PO TABS: 100.0000 mg | ORAL_TABLET | Freq: Once | ORAL | Status: DC

## 2016-01-31 MED ORDER — METRONIDAZOLE 0.75 % VA GEL: 1.0000 | Freq: Two times a day (BID) | VAGINAL | Status: DC

## 2016-01-31 MED ORDER — TERCONAZOLE 0.4 % VA CREA: 1.0000 | TOPICAL_CREAM | Freq: Every day | VAGINAL | Status: DC

## 2016-02-07 ENCOUNTER — Ambulatory Visit (INDEPENDENT_AMBULATORY_CARE_PROVIDER_SITE_OTHER): Payer: Medicaid Other

## 2016-02-07 DIAGNOSIS — O3680X1 Pregnancy with inconclusive fetal viability, fetus 1: Secondary | ICD-10-CM

## 2016-02-07 DIAGNOSIS — Z3401 Encounter for supervision of normal first pregnancy, first trimester: Secondary | ICD-10-CM

## 2016-02-22 ENCOUNTER — Ambulatory Visit (INDEPENDENT_AMBULATORY_CARE_PROVIDER_SITE_OTHER): Payer: Medicaid Other | Admitting: Certified Nurse Midwife

## 2016-02-22 VITALS — BP 122/82 | HR 101 | Temp 98.6°F | Wt 132.0 lb

## 2016-02-22 DIAGNOSIS — Z3482 Encounter for supervision of other normal pregnancy, second trimester: Secondary | ICD-10-CM

## 2016-02-22 LAB — POCT URINALYSIS DIPSTICK
Bilirubin, UA: NEGATIVE
Blood, UA: NEGATIVE
Glucose, UA: NEGATIVE
Ketones, UA: NEGATIVE
Leukocytes, UA: NEGATIVE
Nitrite, UA: NEGATIVE
Spec Grav, UA: 1.02
Urobilinogen, UA: NEGATIVE
pH, UA: 7

## 2016-02-22 NOTE — Progress Notes (Signed)
  Subjective:    Darlene Morgan is a 20 y.o. female being seen today for her obstetrical visit. She is at 4623w0d gestation. Patient reports: no complaints.  Problem List Items Addressed This Visit    None    Visit Diagnoses    Supervision of other normal pregnancy, antepartum, second trimester    -  Primary    Relevant Orders    POCT urinalysis dipstick    US OB Comp + 14 Wk      Patient Active Problem List   Diagnosis Date Noted  . Supervision of normal first pregnancy in first trimester 01/25/2016    Objective:     BP 122/82 mmHg  Pulse 101  Temp(Src) 98.6 F (37 C)  Wt 132 lb (59.875 kg)  LMP 11/23/2015 Uterine Size: Below umbilicus   FHR: 158 with doppler  Assessment:    Pregnancy @ 2223w0d  weeks Doing well    Plan:    Problem list reviewed and updated. Labs reviewed.  Follow up in 4 weeks. FIRST/CF mutation testing/NIPT/QUAD SCREEN/fragile X/[name] population testing/Spinal muscular atrophy discussed: requested. Role of ultrasound in pregnancy discussed; fetal survey: ordered. Amniocentesis discussed: not indicated. 50% of 15 minute visit spent on counseling and coordination of care.

## 2016-03-20 ENCOUNTER — Inpatient Hospital Stay (HOSPITAL_COMMUNITY)
Admission: AD | Admit: 2016-03-20 | Discharge: 2016-03-20 | Disposition: A | Discharge status: Home / Self Care | Payer: Medicaid Other | Source: Ambulatory Visit | Attending: Obstetrics | Admitting: Obstetrics

## 2016-03-20 ENCOUNTER — Encounter (HOSPITAL_COMMUNITY): Payer: Self-pay | Admitting: *Deleted

## 2016-03-20 DIAGNOSIS — Z3A16 16 weeks gestation of pregnancy: Secondary | ICD-10-CM | POA: Insufficient documentation

## 2016-03-20 DIAGNOSIS — O9989 Other specified diseases and conditions complicating pregnancy, childbirth and the puerperium: Secondary | ICD-10-CM | POA: Diagnosis not present

## 2016-03-20 DIAGNOSIS — R109 Unspecified abdominal pain: Secondary | ICD-10-CM | POA: Diagnosis not present

## 2016-03-20 DIAGNOSIS — O26892 Other specified pregnancy related conditions, second trimester: Secondary | ICD-10-CM | POA: Diagnosis present

## 2016-03-20 DIAGNOSIS — S300XXA Contusion of lower back and pelvis, initial encounter: Secondary | ICD-10-CM

## 2016-03-20 DIAGNOSIS — O9A212 Injury, poisoning and certain other consequences of external causes complicating pregnancy, second trimester: Secondary | ICD-10-CM | POA: Diagnosis not present

## 2016-03-20 DIAGNOSIS — S3992XA Unspecified injury of lower back, initial encounter: Secondary | ICD-10-CM | POA: Diagnosis not present

## 2016-03-20 DIAGNOSIS — Z87891 Personal history of nicotine dependence: Secondary | ICD-10-CM | POA: Insufficient documentation

## 2016-03-20 DIAGNOSIS — O26899 Other specified pregnancy related conditions, unspecified trimester: Secondary | ICD-10-CM

## 2016-03-20 LAB — WET PREP, GENITAL
Sperm: NONE SEEN
Trich, Wet Prep: NONE SEEN
Yeast Wet Prep HPF POC: NONE SEEN

## 2016-03-20 LAB — URINE MICROSCOPIC-ADD ON: RBC / HPF: NONE SEEN RBC/hpf (ref 0–5)

## 2016-03-20 LAB — URINALYSIS, ROUTINE W REFLEX MICROSCOPIC
Bilirubin Urine: NEGATIVE
Glucose, UA: NEGATIVE mg/dL
Hgb urine dipstick: NEGATIVE
Ketones, ur: NEGATIVE mg/dL
Leukocytes, UA: NEGATIVE
Nitrite: NEGATIVE
Protein, ur: 30 mg/dL — AB
Specific Gravity, Urine: 1.015 (ref 1.005–1.030)
pH: 8.5 — ABNORMAL HIGH (ref 5.0–8.0)

## 2016-03-20 MED ORDER — IBUPROFEN 600 MG PO TABS
600.0000 mg | ORAL_TABLET | Freq: Once | ORAL | Status: AC
  Administered 2016-03-20: 600 mg via ORAL
  Filled 2016-03-20: qty 1
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
## 2016-03-20 NOTE — MAU Note (Signed)
Pt left before discharge instructions were given and pain could be reassess. Pt stable. To f/u with OB.
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
## 2016-03-21 ENCOUNTER — Encounter: Payer: Medicaid Other | Admitting: Certified Nurse Midwife

## 2016-03-21 ENCOUNTER — Telehealth: Payer: Self-pay

## 2016-03-21 ENCOUNTER — Encounter (HOSPITAL_COMMUNITY): Payer: Self-pay | Admitting: Certified Nurse Midwife

## 2016-03-21 ENCOUNTER — Other Ambulatory Visit: Payer: Self-pay | Admitting: *Deleted

## 2016-03-21 DIAGNOSIS — Z3492 Encounter for supervision of normal pregnancy, unspecified, second trimester: Secondary | ICD-10-CM

## 2016-03-21 LAB — GC/CHLAMYDIA PROBE AMP (~~LOC~~) NOT AT ARMC
Chlamydia: NEGATIVE
Neisseria Gonorrhea: NEGATIVE

## 2016-03-21 NOTE — Telephone Encounter (Signed)
Please put MFM in U/S order - thanks!

## 2016-03-27 ENCOUNTER — Other Ambulatory Visit: Payer: Self-pay | Admitting: Certified Nurse Midwife

## 2016-03-27 ENCOUNTER — Other Ambulatory Visit: Payer: Medicaid Other

## 2016-03-27 ENCOUNTER — Ambulatory Visit (INDEPENDENT_AMBULATORY_CARE_PROVIDER_SITE_OTHER): Payer: Medicaid Other | Admitting: Obstetrics

## 2016-03-27 ENCOUNTER — Ambulatory Visit (HOSPITAL_COMMUNITY)
Admission: RE | Admit: 2016-03-27 | Discharge: 2016-03-27 | Disposition: A | Discharge status: Home / Self Care | Payer: Medicaid Other | Source: Ambulatory Visit | Attending: Certified Nurse Midwife | Admitting: Certified Nurse Midwife

## 2016-03-27 ENCOUNTER — Encounter: Payer: Medicaid Other | Admitting: Certified Nurse Midwife

## 2016-03-27 VITALS — BP 115/72 | HR 96 | Wt 134.0 lb

## 2016-03-27 DIAGNOSIS — Z3402 Encounter for supervision of normal first pregnancy, second trimester: Secondary | ICD-10-CM

## 2016-03-27 DIAGNOSIS — Z1389 Encounter for screening for other disorder: Secondary | ICD-10-CM

## 2016-03-27 DIAGNOSIS — Z3A17 17 weeks gestation of pregnancy: Secondary | ICD-10-CM

## 2016-03-27 DIAGNOSIS — Z36 Encounter for antenatal screening of mother: Secondary | ICD-10-CM | POA: Insufficient documentation

## 2016-03-27 DIAGNOSIS — Z3492 Encounter for supervision of normal pregnancy, unspecified, second trimester: Secondary | ICD-10-CM

## 2016-03-27 LAB — POCT URINALYSIS DIPSTICK
Bilirubin, UA: NEGATIVE
Blood, UA: NEGATIVE
Glucose, UA: NEGATIVE
Ketones, UA: NEGATIVE
Leukocytes, UA: NEGATIVE
Nitrite, UA: NEGATIVE
Protein, UA: NEGATIVE
Spec Grav, UA: <=1.005
Urobilinogen, UA: NEGATIVE
pH, UA: 7.5

## 2016-03-28 ENCOUNTER — Other Ambulatory Visit: Payer: Self-pay | Admitting: Certified Nurse Midwife

## 2016-03-29 ENCOUNTER — Encounter: Payer: Self-pay | Admitting: Obstetrics

## 2016-03-29 NOTE — Progress Notes (Signed)
  Subjective:    Jilda RocheJada Berrie is a 20 y.o. female being seen today for her obstetrical visit. She is at 4573w1d gestation. Patient reports: no complaints.  Problem List Items Addressed This Visit    None    Visit Diagnoses    Encounter for supervision of normal first pregnancy in second trimester    -  Primary    Relevant Orders    POCT urinalysis dipstick (Completed)      Patient Active Problem List   Diagnosis Date Noted  . Supervision of normal first pregnancy in first trimester 01/25/2016    Objective:     BP 115/72 mmHg  Pulse 96  Wt 134 lb (60.782 kg)  LMP 11/23/2015 Uterine Size: Below umbilicus     Assessment:    Pregnancy @ 2073w1d  weeks Doing well    Plan:    Problem list reviewed and updated. Labs reviewed.  Follow up in 4 weeks. FIRST/CF mutation testing/NIPT/QUAD SCREEN/fragile X/[name] population testing/Spinal muscular atrophy discussed: requested. Role of ultrasound in pregnancy discussed; fetal survey: requested. Amniocentesis discussed: not indicated.

## 2016-03-31 ENCOUNTER — Encounter: Payer: Medicaid Other | Admitting: Obstetrics

## 2016-04-01 ENCOUNTER — Other Ambulatory Visit: Payer: Self-pay | Admitting: Certified Nurse Midwife

## 2016-04-03 ENCOUNTER — Other Ambulatory Visit: Payer: Self-pay | Admitting: Certified Nurse Midwife

## 2016-04-10 ENCOUNTER — Other Ambulatory Visit: Payer: Medicaid Other

## 2016-04-10 ENCOUNTER — Encounter: Payer: Medicaid Other | Admitting: Obstetrics

## 2016-04-10 ENCOUNTER — Ambulatory Visit (INDEPENDENT_AMBULATORY_CARE_PROVIDER_SITE_OTHER): Payer: Medicaid Other | Admitting: Certified Nurse Midwife

## 2016-04-10 VITALS — BP 110/62 | HR 80 | Temp 98.7°F | Wt 134.0 lb

## 2016-04-10 DIAGNOSIS — N760 Acute vaginitis: Secondary | ICD-10-CM

## 2016-04-10 DIAGNOSIS — Z3492 Encounter for supervision of normal pregnancy, unspecified, second trimester: Secondary | ICD-10-CM

## 2016-04-10 DIAGNOSIS — O23592 Infection of other part of genital tract in pregnancy, second trimester: Secondary | ICD-10-CM

## 2016-04-10 LAB — POCT URINALYSIS DIPSTICK
Bilirubin, UA: NEGATIVE
Glucose, UA: NEGATIVE
Ketones, UA: NEGATIVE
Nitrite, UA: NEGATIVE
Protein, UA: NEGATIVE
Spec Grav, UA: 1.025
Urobilinogen, UA: NEGATIVE
pH, UA: 6

## 2016-04-10 MED ORDER — METRONIDAZOLE 500 MG PO TABS: 500.0000 mg | ORAL_TABLET | Freq: Two times a day (BID) | ORAL | Status: DC

## 2016-04-10 MED ORDER — TERCONAZOLE 0.8 % VA CREA: 1.0000 | TOPICAL_CREAM | Freq: Every day | VAGINAL | Status: DC

## 2016-04-10 MED ORDER — FLUCONAZOLE 100 MG PO TABS: 100.0000 mg | ORAL_TABLET | Freq: Once | ORAL | Status: DC

## 2016-04-10 NOTE — Progress Notes (Signed)
Subjective:    Darlene RocheJada Morgan is a 20 y.o. female being seen today for her obstetrical visit. She is at 9123w6d gestation. Patient reports: no bleeding, no contractions, no cramping, no leaking and vaginal irritation . Fetal movement: normal.  Problem List Items Addressed This Visit    None    Visit Diagnoses    Prenatal care, second trimester    -  Primary    Relevant Orders    POCT urinalysis dipstick (Completed)      Patient Active Problem List   Diagnosis Date Noted  . Supervision of normal first pregnancy in first trimester 01/25/2016   Objective:    BP 110/62 mmHg  Pulse 80  Temp(Src) 98.7 F (37.1 C)  Wt 134 lb (60.782 kg)  LMP 11/23/2015 FHT: 148 BPM  Uterine Size: size equals dates     Assessment:    Pregnancy @ 2623w6d    Plan:    OBGCT: discussed. Signs and symptoms of preterm labor: discussed.  Labs, problem list reviewed and updated 2 hr GTT planned Follow up in 4 weeks.

## 2016-05-07 ENCOUNTER — Ambulatory Visit (INDEPENDENT_AMBULATORY_CARE_PROVIDER_SITE_OTHER): Payer: Medicaid Other | Admitting: Certified Nurse Midwife

## 2016-05-07 VITALS — BP 110/66 | HR 77 | Temp 98.4°F | Wt 140.0 lb

## 2016-05-07 DIAGNOSIS — Z3402 Encounter for supervision of normal first pregnancy, second trimester: Secondary | ICD-10-CM

## 2016-05-07 DIAGNOSIS — O219 Vomiting of pregnancy, unspecified: Secondary | ICD-10-CM

## 2016-05-07 LAB — POCT URINALYSIS DIPSTICK
Bilirubin, UA: NEGATIVE
Glucose, UA: NEGATIVE
Ketones, UA: NEGATIVE
Nitrite, UA: NEGATIVE
Spec Grav, UA: 1.01
Urobilinogen, UA: NEGATIVE
pH, UA: 7

## 2016-05-07 MED ORDER — ONDANSETRON HCL 4 MG PO TABS: 4.0000 mg | ORAL_TABLET | Freq: Every day | ORAL | Status: DC | PRN

## 2016-05-07 MED ORDER — PRIMACARE 30-1-470 MG PO CAPS: 1.0000 | ORAL_CAPSULE | Freq: Every day | ORAL | Status: DC

## 2016-05-07 NOTE — Progress Notes (Signed)
Subjective:    Darlene RocheJada Morgan is a 20 y.o. female being seen today for her obstetrical visit. She is at 7226w5d gestation. Patient reports: nausea, no bleeding, no contractions, no cramping, no leaking and vomiting . Fetal movement: normal.  Problem List Items Addressed This Visit    None    Visit Diagnoses    Encounter for supervision of normal first pregnancy in second trimester    -  Primary    Relevant Medications    ondansetron (ZOFRAN) 4 MG tablet    Pren-Fe-Meth-FA-Omeg w/o A (PRIMACARE) 30-1-470 MG CAPS    Other Relevant Orders    POCT urinalysis dipstick (Completed)    US MFM OB FOLLOW UP    Culture, OB Urine    Nausea and vomiting during pregnancy        Relevant Medications    ondansetron (ZOFRAN) 4 MG tablet      Patient Active Problem List   Diagnosis Date Noted  . Supervision of normal first pregnancy in first trimester 01/25/2016   Objective:    BP 110/66 mmHg  Pulse 77  Temp(Src) 98.4 F (36.9 C)  Wt 140 lb (63.504 kg)  LMP 11/23/2015 FHT: 160 BPM  Uterine Size: size equals dates     Assessment:    Pregnancy @ 2726w5d    N&V in pregnancy   Plan:    OBGCT: discussed and ordered for next visit. Signs and symptoms of preterm labor: discussed.  Labs, problem list reviewed and updated 2 hr GTT planned Follow up in 4 weeks.

## 2016-05-09 ENCOUNTER — Other Ambulatory Visit: Payer: Self-pay | Admitting: Certified Nurse Midwife

## 2016-05-09 ENCOUNTER — Ambulatory Visit (HOSPITAL_COMMUNITY)
Admission: RE | Admit: 2016-05-09 | Discharge: 2016-05-09 | Disposition: A | Discharge status: Home / Self Care | Payer: Medicaid Other | Source: Ambulatory Visit | Attending: Certified Nurse Midwife | Admitting: Certified Nurse Midwife

## 2016-05-09 DIAGNOSIS — IMO0002 Reserved for concepts with insufficient information to code with codable children: Secondary | ICD-10-CM

## 2016-05-09 DIAGNOSIS — Z0489 Encounter for examination and observation for other specified reasons: Secondary | ICD-10-CM

## 2016-05-09 DIAGNOSIS — Z36 Encounter for antenatal screening of mother: Secondary | ICD-10-CM | POA: Insufficient documentation

## 2016-05-09 DIAGNOSIS — Z3A24 24 weeks gestation of pregnancy: Secondary | ICD-10-CM

## 2016-05-09 DIAGNOSIS — Z3402 Encounter for supervision of normal first pregnancy, second trimester: Secondary | ICD-10-CM

## 2016-05-09 LAB — URINE CULTURE, OB REFLEX: Organism ID, Bacteria: NO GROWTH

## 2016-05-09 LAB — CULTURE, OB URINE

## 2016-05-10 ENCOUNTER — Other Ambulatory Visit: Payer: Self-pay | Admitting: Certified Nurse Midwife

## 2016-05-16 ENCOUNTER — Other Ambulatory Visit: Payer: Self-pay | Admitting: Certified Nurse Midwife

## 2016-05-23 ENCOUNTER — Inpatient Hospital Stay (HOSPITAL_COMMUNITY)
Admission: AD | Admit: 2016-05-23 | Discharge: 2016-05-23 | Disposition: A | Discharge status: Home / Self Care | Payer: Medicaid Other | Source: Ambulatory Visit | Attending: Obstetrics | Admitting: Obstetrics

## 2016-05-23 ENCOUNTER — Encounter (HOSPITAL_COMMUNITY): Payer: Self-pay | Admitting: *Deleted

## 2016-05-23 DIAGNOSIS — M549 Dorsalgia, unspecified: Secondary | ICD-10-CM

## 2016-05-23 DIAGNOSIS — Z3A26 26 weeks gestation of pregnancy: Secondary | ICD-10-CM | POA: Insufficient documentation

## 2016-05-23 DIAGNOSIS — Z79899 Other long term (current) drug therapy: Secondary | ICD-10-CM | POA: Insufficient documentation

## 2016-05-23 DIAGNOSIS — O99891 Other specified diseases and conditions complicating pregnancy: Secondary | ICD-10-CM

## 2016-05-23 DIAGNOSIS — M546 Pain in thoracic spine: Secondary | ICD-10-CM | POA: Diagnosis not present

## 2016-05-23 DIAGNOSIS — O26892 Other specified pregnancy related conditions, second trimester: Secondary | ICD-10-CM | POA: Insufficient documentation

## 2016-05-23 DIAGNOSIS — J45909 Unspecified asthma, uncomplicated: Secondary | ICD-10-CM | POA: Diagnosis not present

## 2016-05-23 DIAGNOSIS — Z87891 Personal history of nicotine dependence: Secondary | ICD-10-CM | POA: Insufficient documentation

## 2016-05-23 DIAGNOSIS — Z885 Allergy status to narcotic agent status: Secondary | ICD-10-CM | POA: Insufficient documentation

## 2016-05-23 DIAGNOSIS — O9989 Other specified diseases and conditions complicating pregnancy, childbirth and the puerperium: Secondary | ICD-10-CM

## 2016-05-23 LAB — URINALYSIS, ROUTINE W REFLEX MICROSCOPIC
Bilirubin Urine: NEGATIVE
Glucose, UA: NEGATIVE mg/dL
Hgb urine dipstick: NEGATIVE
Ketones, ur: NEGATIVE mg/dL
Leukocytes, UA: NEGATIVE
Nitrite: NEGATIVE
Protein, ur: NEGATIVE mg/dL
Specific Gravity, Urine: <1.005 — ABNORMAL LOW (ref 1.005–1.030)
pH: 6 (ref 5.0–8.0)

## 2016-05-23 LAB — WET PREP, GENITAL
Clue Cells Wet Prep HPF POC: NONE SEEN
Sperm: NONE SEEN
Trich, Wet Prep: NONE SEEN
Yeast Wet Prep HPF POC: NONE SEEN

## 2016-05-23 LAB — GC/CHLAMYDIA PROBE AMP (~~LOC~~) NOT AT ARMC
Chlamydia: NEGATIVE
Neisseria Gonorrhea: NEGATIVE

## 2016-05-23 MED ORDER — CYCLOBENZAPRINE HCL 10 MG PO TABS
10.0000 mg | ORAL_TABLET | Freq: Once | ORAL | Status: AC
  Administered 2016-05-23: 10 mg via ORAL
  Filled 2016-05-23: qty 1

## 2016-05-23 MED ORDER — CYCLOBENZAPRINE HCL 10 MG PO TABS: 10.0000 mg | ORAL_TABLET | Freq: Three times a day (TID) | ORAL | Status: DC | PRN

## 2016-05-23 NOTE — Discharge Instructions (Signed)

## 2016-05-23 NOTE — MAU Note (Signed)
PT  SAYS SHE STARTED  BAD   X3  DAYS-  DID NOT  COME IN-  BC  SISTER   TOLD  HER  IT  WAS GAS.      PNC-  WITH   DR HARPER-   .     DENIES HSV AND MRSA.    LAST SEX--   1  HR  AGO.

## 2016-05-23 NOTE — MAU Provider Note (Signed)
History     CSN: [id number]  Arrival date and time: 05/23/16 0227   First Provider Initiated Contact with Patient 05/23/16 0321      Chief Complaint  Patient presents with  . Contractions  . Pelvic Pain   HPI Comments: [name] is 20 y.o.G1P0 at 937w0d who presents today with mid-back pain. She states that the pain started about 3 days ago. She denies any VB or LOF. She confirms fetal movement. Patient reports that she has had intercourse in the last 24 hours.   Back Pain This is a new problem. Episode onset: 3 days ago. The problem has been gradually worsening since onset. The pain is present in the thoracic spine. The quality of the pain is described as stabbing. The pain is at a severity of 2/10 (2/10 currently. Patient states it was 8/10 at it worst. ). Pertinent negatives include no abdominal pain, dysuria or fever. She has tried nothing for the symptoms.    Past Medical History  Diagnosis Date  . Asthma   . Heart murmur     pt states small     Past Surgical History  Procedure Laterality Date  . Tonsillectomy    . Tonsillectomy      Family History  Problem Relation Age of Onset  . Hypertension Other   . Hypertension Maternal Grandmother   . Thyroid disease Maternal Grandmother     Social History  Substance Use Topics  . Smoking status: Former Smoker    Types: Cigarettes  . Smokeless tobacco: None  . Alcohol Use: No    Allergies:  Allergies  Allergen Reactions  . Hydrocodone Itching    Prescriptions prior to admission  Medication Sig Dispense Refill Last Dose  . albuterol (PROVENTIL HFA;VENTOLIN HFA) 108 (90 BASE) MCG/ACT inhaler Inhale 2 puffs into the lungs every 6 (six) hours as needed for wheezing or shortness of breath (wheezing).    Taking  . metroNIDAZOLE (FLAGYL) 500 MG tablet Take 1 tablet (500 mg total) by mouth 2 (two) times daily. 14 tablet 0 Taking  . ondansetron (ZOFRAN) 4 MG tablet Take 1 tablet (4 mg total) by mouth daily as needed. 30  tablet 1   . Pren-Fe-Meth-FA-Omeg w/o A (PRIMACARE) 30-1-470 MG CAPS Take 1 tablet by mouth daily. 30 capsule 12   . Prenatal Vit-Fe Phos-FA-Omega (VITAFOL GUMMIES) 3.33-0.333-34.8 MG CHEW Chew 3 tablets by mouth daily. 90 tablet 12 Taking    Review of Systems  Constitutional: Negative for fever and chills.  Gastrointestinal: Positive for diarrhea. Negative for nausea, vomiting, abdominal pain and constipation.  Genitourinary: Negative for dysuria, urgency and frequency.  Musculoskeletal: Positive for back pain.   Physical Exam   Blood pressure 109/75, pulse 96, temperature 98.2 F (36.8 C), temperature source Oral, resp. rate 20, height 5\' 5"  (1.651 m), weight 65.998 kg (145 lb 8 oz), last menstrual period 11/23/2015.  Physical Exam  Nursing note and vitals reviewed. Constitutional: She is oriented to person, place, and time. She appears well-developed and well-nourished. No distress.  HENT:  Head: Normocephalic.  Cardiovascular: Normal rate.   Respiratory: Effort normal.  GI: Soft. There is no tenderness. There is no rebound.  Genitourinary:   External: no lesion Vagina: small amount of white discharge Cervix: pink, smooth, closed/thick  Uterus: AGA    Neurological: She is alert and oriented to person, place, and time.  Skin: Skin is warm and dry.  Psychiatric: She has a normal mood and affect.   FHT: 135, moderate with 15x15  accels, no decels Toco: no UCs   Results for orders placed or performed during the hospital encounter of 05/23/16 (from the past 24 hour(s))  Urinalysis, Routine w reflex microscopic (not at [facility])     Status: Abnormal   Collection Time: 05/23/16  2:31 AM  Result Value Ref Range   Color, Urine YELLOW YELLOW   APPearance CLEAR CLEAR   Specific Gravity, Urine <1.005 (L) 1.005 - 1.030   pH 6.0 5.0 - 8.0   Glucose, UA NEGATIVE NEGATIVE mg/dL   Hgb urine dipstick NEGATIVE NEGATIVE   Bilirubin Urine NEGATIVE NEGATIVE   Ketones, ur NEGATIVE NEGATIVE  mg/dL   Protein, ur NEGATIVE NEGATIVE mg/dL   Nitrite NEGATIVE NEGATIVE   Leukocytes, UA NEGATIVE NEGATIVE  Wet prep, genital     Status: Abnormal   Collection Time: 05/23/16  3:30 AM  Result Value Ref Range   Yeast Wet Prep HPF POC NONE SEEN NONE SEEN   Trich, Wet Prep NONE SEEN NONE SEEN   Clue Cells Wet Prep HPF POC NONE SEEN NONE SEEN   WBC, Wet Prep HPF POC FEW (A) NONE SEEN   Sperm NONE SEEN     MAU Course  Procedures  MDM Some thin, watery discharge noted on exam. Patient reports intercourse just prior to arrival. DustinFern: negative. +sperm   Assessment and Plan   1. Back pain affecting pregnancy in second trimester    DC home Comfort measures reviewed  3rd Trimester precautions  PTL precautions  Fetal kick counts RX: flexeril PR #20  Return to MAU as needed FU with OB as planned  Follow-up Information    Follow up with Brock BadHARPER,CHARLES A, MD.   Specialty:  Obstetrics and Gynecology   Why:  As scheduled   Contact information:   [address] [place] [id number] [phone]         Tawnya CrookHogan, Nataliyah Packham Donovan 05/23/2016, 3:23 AM

## 2016-06-04 ENCOUNTER — Other Ambulatory Visit (INDEPENDENT_AMBULATORY_CARE_PROVIDER_SITE_OTHER): Payer: Medicaid Other

## 2016-06-04 ENCOUNTER — Ambulatory Visit (INDEPENDENT_AMBULATORY_CARE_PROVIDER_SITE_OTHER): Payer: Medicaid Other | Admitting: Certified Nurse Midwife

## 2016-06-04 ENCOUNTER — Encounter: Payer: Self-pay | Admitting: *Deleted

## 2016-06-04 VITALS — BP 114/77 | HR 94 | Wt 141.8 lb

## 2016-06-04 DIAGNOSIS — Z1389 Encounter for screening for other disorder: Secondary | ICD-10-CM

## 2016-06-04 DIAGNOSIS — K219 Gastro-esophageal reflux disease without esophagitis: Secondary | ICD-10-CM

## 2016-06-04 DIAGNOSIS — Z3402 Encounter for supervision of normal first pregnancy, second trimester: Secondary | ICD-10-CM

## 2016-06-04 DIAGNOSIS — O99613 Diseases of the digestive system complicating pregnancy, third trimester: Secondary | ICD-10-CM

## 2016-06-04 DIAGNOSIS — Z3403 Encounter for supervision of normal first pregnancy, third trimester: Secondary | ICD-10-CM

## 2016-06-04 DIAGNOSIS — Z331 Pregnant state, incidental: Secondary | ICD-10-CM

## 2016-06-04 LAB — POCT URINALYSIS DIPSTICK
Bilirubin, UA: NEGATIVE
Blood, UA: NEGATIVE
Glucose, UA: NEGATIVE
Ketones, UA: NEGATIVE
Leukocytes, UA: NEGATIVE
Nitrite, UA: NEGATIVE
Protein, UA: NEGATIVE
Spec Grav, UA: 1.015
Urobilinogen, UA: NEGATIVE
pH, UA: 5.5

## 2016-06-04 MED ORDER — OMEPRAZOLE 20 MG PO CPDR: 20.0000 mg | DELAYED_RELEASE_CAPSULE | Freq: Two times a day (BID) | ORAL | Status: DC

## 2016-06-04 NOTE — Progress Notes (Signed)
Subjective:    Darlene Morgan is a 20 y.o. female being seen today for her obstetrical visit. She is at 3564w5d gestation. Patient reports: heartburn, no bleeding, no contractions, no cramping and no leaking . Fetal movement: normal.  Problem List Items Addressed This Visit    None    Visit Diagnoses    Gastroesophageal reflux in pregancy in third trimester    -  Primary    Relevant Medications    omeprazole (PRILOSEC) 20 MG capsule    Encounter for supervision of normal first pregnancy in second trimester        Relevant Orders    US MFM OB FOLLOW UP    Glucose Tolerance, 2 Hours w/1 Hour    CBC    HIV antibody    RPR    POCT urinalysis dipstick      Patient Active Problem List   Diagnosis Date Noted  . Supervision of normal first pregnancy in first trimester 01/25/2016   Objective:    BP 114/77 mmHg  Pulse 94  Wt 141 lb 12.8 oz (64.32 kg)  LMP 11/23/2015 FHT: 135 BPM  Uterine Size: size equals dates     Assessment:    Pregnancy @ 6764w5d    Reflux in pregnancy Plan:    OBGCT: ordered. Signs and symptoms of preterm labor: discussed.  Labs, problem list reviewed and updated 2 hr GTT today Follow up in 2 weeks.

## 2016-06-05 LAB — GLUCOSE TOLERANCE, 2 HOURS W/ 1HR
Glucose, 1 hour: 63 mg/dL — ABNORMAL LOW (ref 65–179)
Glucose, 2 hour: 69 mg/dL (ref 65–152)
Glucose, Fasting: 77 mg/dL (ref 65–91)

## 2016-06-05 LAB — CBC
Hematocrit: 32.9 % — ABNORMAL LOW (ref 34.0–46.6)
Hemoglobin: 10.9 g/dL — ABNORMAL LOW (ref 11.1–15.9)
MCH: 30 pg (ref 26.6–33.0)
MCHC: 33.1 g/dL (ref 31.5–35.7)
MCV: 91 fL (ref 79–97)
Platelets: 277 10*3/uL (ref 150–379)
RBC: 3.63 x10E6/uL — ABNORMAL LOW (ref 3.77–5.28)
RDW: 13.9 % (ref 12.3–15.4)
WBC: 6 10*3/uL (ref 3.4–10.8)

## 2016-06-05 LAB — RPR: RPR Ser Ql: NONREACTIVE

## 2016-06-05 LAB — HIV ANTIBODY (ROUTINE TESTING W REFLEX): HIV Screen 4th Generation wRfx: NONREACTIVE

## 2016-06-18 ENCOUNTER — Ambulatory Visit (INDEPENDENT_AMBULATORY_CARE_PROVIDER_SITE_OTHER): Payer: Medicaid Other | Admitting: Certified Nurse Midwife

## 2016-06-18 ENCOUNTER — Ambulatory Visit (HOSPITAL_COMMUNITY)
Admission: RE | Admit: 2016-06-18 | Discharge: 2016-06-18 | Disposition: A | Discharge status: Home / Self Care | Payer: Medicaid Other | Source: Ambulatory Visit | Attending: Certified Nurse Midwife | Admitting: Certified Nurse Midwife

## 2016-06-18 ENCOUNTER — Other Ambulatory Visit: Payer: Self-pay | Admitting: Certified Nurse Midwife

## 2016-06-18 ENCOUNTER — Encounter: Payer: Medicaid Other | Admitting: Certified Nurse Midwife

## 2016-06-18 VITALS — BP 118/71 | HR 80 | Temp 97.4°F | Wt 143.0 lb

## 2016-06-18 DIAGNOSIS — Z36 Encounter for antenatal screening of mother: Secondary | ICD-10-CM | POA: Insufficient documentation

## 2016-06-18 DIAGNOSIS — Z3A29 29 weeks gestation of pregnancy: Secondary | ICD-10-CM | POA: Diagnosis present

## 2016-06-18 DIAGNOSIS — Z3402 Encounter for supervision of normal first pregnancy, second trimester: Secondary | ICD-10-CM

## 2016-06-18 DIAGNOSIS — J4520 Mild intermittent asthma, uncomplicated: Secondary | ICD-10-CM

## 2016-06-18 DIAGNOSIS — Z0489 Encounter for examination and observation for other specified reasons: Secondary | ICD-10-CM

## 2016-06-18 DIAGNOSIS — IMO0002 Reserved for concepts with insufficient information to code with codable children: Secondary | ICD-10-CM

## 2016-06-18 DIAGNOSIS — Z3403 Encounter for supervision of normal first pregnancy, third trimester: Secondary | ICD-10-CM

## 2016-06-18 LAB — POCT URINALYSIS DIPSTICK
Spec Grav, UA: <=1.005
pH, UA: 7

## 2016-06-18 MED ORDER — ALBUTEROL SULFATE HFA 108 (90 BASE) MCG/ACT IN AERS: 2.0000 | INHALATION_SPRAY | Freq: Four times a day (QID) | RESPIRATORY_TRACT | Status: DC | PRN

## 2016-06-18 NOTE — Progress Notes (Signed)
Subjective:    Darlene RocheJada Morgan is a 20 y.o. female being seen today for her obstetrical visit. She is at 3481w5d gestation. Patient reports no complaints. Fetal movement: normal.  Problem List Items Addressed This Visit    None    Visit Diagnoses    Encounter for supervision of normal first pregnancy in third trimester    -  Primary      Patient Active Problem List   Diagnosis Date Noted  . Supervision of normal first pregnancy in first trimester 01/25/2016   Objective:    BP 118/71 mmHg  Pulse 80  Temp(Src) 97.4 F (36.3 C)  Wt 143 lb (64.864 kg)  LMP 11/23/2015 FHT:  140 BPM  Uterine Size: size equals dates  Presentation: cephalic     Assessment:    Pregnancy @ 1681w5d weeks   Doing well  Plan:     labs reviewed, problem list updated Consent signed. GBS planning TDAP offered  Rhogam given for RH negative Pediatrician: discussed. Infant feeding: plans to breastfeed. Maternity leave: discussed. Cigarette smoking: never smoked. No orders of the defined types were placed in this encounter.   No orders of the defined types were placed in this encounter.   Follow up in 2 Weeks.

## 2016-06-18 NOTE — Progress Notes (Signed)
Patient reports she has a whitish discharge.

## 2016-06-29 ENCOUNTER — Inpatient Hospital Stay (HOSPITAL_COMMUNITY)
Admission: AD | Admit: 2016-06-29 | Discharge: 2016-06-29 | Disposition: A | Discharge status: Home / Self Care | Payer: Medicaid Other | Source: Ambulatory Visit | Attending: Obstetrics & Gynecology | Admitting: Obstetrics & Gynecology

## 2016-06-29 ENCOUNTER — Encounter (HOSPITAL_COMMUNITY): Payer: Self-pay | Admitting: *Deleted

## 2016-06-29 DIAGNOSIS — O26893 Other specified pregnancy related conditions, third trimester: Secondary | ICD-10-CM | POA: Insufficient documentation

## 2016-06-29 DIAGNOSIS — O9989 Other specified diseases and conditions complicating pregnancy, childbirth and the puerperium: Secondary | ICD-10-CM | POA: Diagnosis not present

## 2016-06-29 DIAGNOSIS — Z79899 Other long term (current) drug therapy: Secondary | ICD-10-CM | POA: Insufficient documentation

## 2016-06-29 DIAGNOSIS — O99343 Other mental disorders complicating pregnancy, third trimester: Secondary | ICD-10-CM | POA: Diagnosis not present

## 2016-06-29 DIAGNOSIS — R1013 Epigastric pain: Secondary | ICD-10-CM

## 2016-06-29 DIAGNOSIS — Z87891 Personal history of nicotine dependence: Secondary | ICD-10-CM | POA: Insufficient documentation

## 2016-06-29 DIAGNOSIS — R110 Nausea: Secondary | ICD-10-CM | POA: Insufficient documentation

## 2016-06-29 DIAGNOSIS — J45909 Unspecified asthma, uncomplicated: Secondary | ICD-10-CM | POA: Insufficient documentation

## 2016-06-29 DIAGNOSIS — R109 Unspecified abdominal pain: Secondary | ICD-10-CM | POA: Diagnosis present

## 2016-06-29 DIAGNOSIS — O99513 Diseases of the respiratory system complicating pregnancy, third trimester: Secondary | ICD-10-CM | POA: Insufficient documentation

## 2016-06-29 DIAGNOSIS — M546 Pain in thoracic spine: Secondary | ICD-10-CM | POA: Insufficient documentation

## 2016-06-29 DIAGNOSIS — Z3A31 31 weeks gestation of pregnancy: Secondary | ICD-10-CM | POA: Insufficient documentation

## 2016-06-29 DIAGNOSIS — F4323 Adjustment disorder with mixed anxiety and depressed mood: Secondary | ICD-10-CM

## 2016-06-29 LAB — URINALYSIS, ROUTINE W REFLEX MICROSCOPIC
Bilirubin Urine: NEGATIVE
Glucose, UA: NEGATIVE mg/dL
Hgb urine dipstick: NEGATIVE
Ketones, ur: NEGATIVE mg/dL
Nitrite: NEGATIVE
Protein, ur: NEGATIVE mg/dL
Specific Gravity, Urine: 1.02 (ref 1.005–1.030)
pH: 6 (ref 5.0–8.0)

## 2016-06-29 LAB — WET PREP, GENITAL
Clue Cells Wet Prep HPF POC: NONE SEEN
Sperm: NONE SEEN
Trich, Wet Prep: NONE SEEN
Yeast Wet Prep HPF POC: NONE SEEN

## 2016-06-29 LAB — URINE MICROSCOPIC-ADD ON: RBC / HPF: NONE SEEN RBC/hpf (ref 0–5)

## 2016-06-29 LAB — OB RESULTS CONSOLE GC/CHLAMYDIA: Gonorrhea: NEGATIVE

## 2016-06-29 MED ORDER — GI COCKTAIL ~~LOC~~
30.0000 mL | Freq: Once | ORAL | Status: AC
  Administered 2016-06-29: 30 mL via ORAL
  Filled 2016-06-29: qty 30

## 2016-06-29 NOTE — Progress Notes (Signed)
Dr. Vickie Epley notified of wet prep results.
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
## 2016-06-29 NOTE — Discharge Instructions (Signed)
Major Depressive Disorder °Major depressive disorder is a mental illness. It also may be called clinical depression or unipolar depression. Major depressive disorder usually causes feelings of sadness, hopelessness, or helplessness. Some people with this disorder do not feel particularly sad but lose interest in doing things they used to enjoy (anhedonia). Major depressive disorder also can cause physical symptoms. It can interfere with work, school, relationships, and other normal everyday activities. The disorder varies in severity but is longer lasting and more serious than the sadness we all feel from time to time in our lives. °Major depressive disorder often is triggered by stressful life events or major life changes. Examples of these triggers include divorce, loss of your job or home, a move, and the death of a family member or close friend. Sometimes this disorder occurs for no obvious reason at all. People who have family members with major depressive disorder or bipolar disorder are at higher risk for developing this disorder, with or without life stressors. Major depressive disorder can occur at any age. It may occur just once in your life (single episode major depressive disorder). It may occur multiple times (recurrent major depressive disorder). °SYMPTOMS °People with major depressive disorder have either anhedonia or depressed mood on nearly a daily basis for at least 2 weeks or longer. Symptoms of depressed mood include: °· Feelings of sadness (blue or down in the dumps) or emptiness. °· Feelings of hopelessness or helplessness. °· Tearfulness or episodes of crying (may be observed by others). °· Irritability (children and adolescents). °In addition to depressed mood or anhedonia or both, people with this disorder have at least four of the following symptoms: °· Difficulty sleeping or sleeping too much.   °· Significant change (increase or decrease) in appetite or weight.   °· Lack of energy or  motivation. °· Feelings of guilt and worthlessness.   °· Difficulty concentrating, remembering, or making decisions. °· Unusually slow movement (psychomotor retardation) or restlessness (as observed by others).   °· Recurrent wishes for death, recurrent thoughts of self-harm (suicide), or a suicide attempt. °People with major depressive disorder commonly have persistent negative thoughts about themselves, other people, and the world. People with severe major depressive disorder may experience distorted beliefs or perceptions about the world (psychotic delusions). They also may see or hear things that are not real (psychotic hallucinations). °DIAGNOSIS °Major depressive disorder is diagnosed through an assessment by your health care provider. Your health care provider will ask about aspects of your daily life, such as mood, sleep, and appetite, to see if you have the diagnostic symptoms of major depressive disorder. Your health care provider may ask about your medical history and use of alcohol or drugs, including prescription medicines. Your health care provider also may do a physical exam and blood work. This is because certain medical conditions and the use of certain substances can cause major depressive disorder-like symptoms (secondary depression). Your health care provider also may refer you to a mental health specialist for further evaluation and treatment. °TREATMENT °It is important to recognize the symptoms of major depressive disorder and seek treatment. The following treatments can be prescribed for this disorder:   °· Medicine. Antidepressant medicines usually are prescribed. Antidepressant medicines are thought to correct chemical imbalances in the brain that are commonly associated with major depressive disorder. Other types of medicine may be added if the symptoms do not respond to antidepressant medicines alone or if psychotic delusions or hallucinations occur. °· Talk therapy. Talk therapy can be  helpful in treating major depressive disorder by providing   support, education, and guidance. Certain types of talk therapy also can help with negative thinking (cognitive behavioral therapy) and with relationship issues that trigger this disorder (interpersonal therapy). A mental health specialist can help determine which treatment is best for you. Most people with major depressive disorder do well with a combination of medicine and talk therapy. Treatments involving electrical stimulation of the brain can be used in situations with extremely severe symptoms or when medicine and talk therapy do not work over time. These treatments include electroconvulsive therapy, transcranial magnetic stimulation, and vagal nerve stimulation.   This information is not intended to replace advice given to you by your health care provider. Make sure you discuss any questions you have with your health care provider.   Document Released: 03/14/2013 Document Revised: 12/08/2014 Document Reviewed: 03/14/2013 Elsevier Interactive Patient Education 2016 [employer] Round Ligament Pain The round ligament is a cord of muscle and tissue that helps to support the uterus. It can become a source of pain during pregnancy if it becomes stretched or twisted as the baby grows. The pain usually begins in the second trimester of pregnancy, and it can come and go until the baby is delivered. It is not a serious problem, and it does not cause harm to the baby. Round ligament pain is usually a short, sharp, and pinching pain, but it can also be a dull, lingering, and aching pain. The pain is felt in the lower side of the abdomen or in the groin. It usually starts deep in the groin and moves up to the outside of the hip area. Pain can occur with:  A sudden change in position.  Rolling over in bed.  Coughing or sneezing.  Physical activity. HOME CARE INSTRUCTIONS Watch your condition for any changes. Take these steps to help with your  pain:  When the pain starts, relax. Then try:  Sitting down.  Flexing your knees up to your abdomen.  Lying on your side with one pillow under your abdomen and another pillow between your legs.  Sitting in a warm bath for 15-20 minutes or until the pain goes away.  Take over-the-counter and prescription medicines only as told by your health care provider.  Move slowly when you sit and stand.  Avoid long walks if they cause pain.  Stop or lessen your physical activities if they cause pain. SEEK MEDICAL CARE IF:  Your pain does not go away with treatment.  You feel pain in your back that you did not have before.  Your medicine is not helping. SEEK IMMEDIATE MEDICAL CARE IF:  You develop a fever or chills.  You develop uterine contractions.  You develop vaginal bleeding.  You develop nausea or vomiting.  You develop diarrhea.  You have pain when you urinate.   This information is not intended to replace advice given to you by your health care provider. Make sure you discuss any questions you have with your health care provider.   Document Released: 08/26/2008 Document Revised: 02/09/2012 Document Reviewed: 01/24/2015 Elsevier Interactive Patient Education [employer]

## 2016-06-29 NOTE — MAU Note (Addendum)
Pt arrived via EMS. Pt states she is having cramp that are around her belly button and they started at 2000 last night.  Pt denies bleeding or leaking of fluid.  Pt states she felt lightheaded and dizzy last night.

## 2016-06-29 NOTE — Progress Notes (Signed)
Marlynn Perking, CNM notified of pt in MAU.  Notified that pt is a G1P0 at [redacted]w[redacted]d.  Notified that pt came in via EMS due to cramping pains at and above her belly button.  Notified that pt also states she felt lightheaded and dizzy last night.  Provider states she will come see the pt.

## 2016-06-30 LAB — GC/CHLAMYDIA PROBE AMP (~~LOC~~) NOT AT ARMC
Chlamydia: NEGATIVE
Neisseria Gonorrhea: NEGATIVE

## 2016-07-01 ENCOUNTER — Institutional Professional Consult (permissible substitution): Payer: Medicaid Other

## 2016-07-02 ENCOUNTER — Ambulatory Visit (INDEPENDENT_AMBULATORY_CARE_PROVIDER_SITE_OTHER): Payer: Medicaid Other | Admitting: Certified Nurse Midwife

## 2016-07-02 ENCOUNTER — Institutional Professional Consult (permissible substitution): Payer: Self-pay

## 2016-07-02 VITALS — BP 136/81 | HR 102 | Temp 97.4°F | Wt 143.1 lb

## 2016-07-02 DIAGNOSIS — Z3401 Encounter for supervision of normal first pregnancy, first trimester: Secondary | ICD-10-CM

## 2016-07-02 NOTE — Progress Notes (Signed)
Subjective:  Darlene Morgan is a 20 y.o. G1P0 at [redacted]w[redacted]d being seen today for ongoing prenatal care.  She is currently monitored for the following issues for this low-risk pregnancy and has Supervision of normal first pregnancy in first trimester on her problem list.  Patient requesting IOL for discomforts of pregnancy. .  Contractions: Irregular. Vag. Bleeding: None.  Movement: Present. Denies leaking of fluid.   The following portions of the patient's history were reviewed and updated as appropriate: allergies, current medications, past family history, past medical history, past social history, past surgical history and problem list. Problem list updated.  Objective:   Vitals:   07/02/16 1216  BP: 136/81  Pulse: (!) 102  Temp: 97.4 F (36.3 C)  Weight: 143 lb 2 oz (64.9 kg)    Fetal Status: Fetal Heart Rate (bpm): 156 Fundal Height: 31 cm Movement: Present  Presentation: Vertex  General:  Alert, oriented and cooperative. Patient is in no acute distress.  Skin: Skin is warm and dry. No rash noted.   Cardiovascular: Normal heart rate noted  Respiratory: Normal respiratory effort, no problems with respiration noted  Abdomen: Soft, gravid, appropriate for gestational age. Pain/Pressure: Present     Pelvic:  Cervical exam deferred        Extremities: Normal range of motion.  Edema: None  Mental Status: Normal mood and affect. Normal behavior. Normal judgment and thought content.   Urinalysis: Urine Protein: Negative Urine Glucose: Negative  Assessment and Plan:  Pregnancy: G1P0 at [redacted]w[redacted]d  There are no diagnoses linked to this encounter. Preterm labor symptoms and general obstetric precautions including but not limited to vaginal bleeding, contractions, leaking of fluid and fetal movement were reviewed in detail with the patient. Please refer to After Visit Summary for other counseling recommendations.  Lengthy discussion w/ pt about why IOL is not an option. Explained that baby is not  mature enough to be born and would need to be admitted to intensive care if delivered now. Recommended more childbirth education and offered comfort measures.  Return in about 2 weeks (around 07/16/2016) for ROB.   Dorathy Kinsman, CNM

## 2016-07-02 NOTE — Patient Instructions (Signed)
Braxton Hicks Contractions Contractions of the uterus can occur throughout pregnancy. Contractions are not always a sign that you are in labor.  WHAT ARE BRAXTON HICKS CONTRACTIONS?  Contractions that occur before labor are called Braxton Hicks contractions, or false labor. Toward the end of pregnancy (32-34 weeks), these contractions can develop more often and may become more forceful. This is not true labor because these contractions do not result in opening (dilatation) and thinning of the cervix. They are sometimes difficult to tell apart from true labor because these contractions can be forceful and people have different pain tolerances. You should not feel embarrassed if you go to the hospital with false labor. Sometimes, the only way to tell if you are in true labor is for your health care provider to look for changes in the cervix. If there are no prenatal problems or other health problems associated with the pregnancy, it is completely safe to be sent home with false labor and await the onset of true labor. HOW CAN YOU TELL THE DIFFERENCE BETWEEN TRUE AND FALSE LABOR? False Labor  The contractions of false labor are usually shorter and not as hard as those of true labor.   The contractions are usually irregular.   The contractions are often felt in the front of the lower abdomen and in the groin.   The contractions may go away when you walk around or change positions while lying down.   The contractions get weaker and are shorter lasting as time goes on.   The contractions do not usually become progressively stronger, regular, and closer together as with true labor.  True Labor  Contractions in true labor last 30-70 seconds, become very regular, usually become more intense, and increase in frequency.   The contractions do not go away with walking.   The discomfort is usually felt in the top of the uterus and spreads to the lower abdomen and low back.   True labor can be  determined by your health care provider with an exam. This will show that the cervix is dilating and getting thinner.  WHAT TO REMEMBER  Keep up with your usual exercises and follow other instructions given by your health care provider.   Take medicines as directed by your health care provider.   Keep your regular prenatal appointments.   Eat and drink lightly if you think you are going into labor.   If Braxton Hicks contractions are making you uncomfortable:   Change your position from lying down or resting to walking, or from walking to resting.   Sit and rest in a tub of warm water.   Drink 2-3 glasses of water. Dehydration may cause these contractions.   Do slow and deep breathing several times an hour.  WHEN SHOULD I SEEK IMMEDIATE MEDICAL CARE? Seek immediate medical care if:  Your contractions become stronger, more regular, and closer together.   You have fluid leaking or gushing from your vagina.   You have a fever.   You pass blood-tinged mucus.   You have vaginal bleeding.   You have continuous abdominal pain.   You have low back pain that you never had before.   You feel your baby's head pushing down and causing pelvic pressure.   Your baby is not moving as much as it used to.    This information is not intended to replace advice given to you by your health care provider. Make sure you discuss any questions you have with your health care  provider.   Document Released: 11/17/2005 Document Revised: 11/22/2013 Document Reviewed: 08/29/2013 Elsevier Interactive Patient Education 2016 [employer].   How a Baby Grows During Pregnancy Pregnancy begins when a female's sperm enters a female's egg (fertilization). This happens in one of the tubes (fallopian tubes) that connect the ovaries to the womb (uterus). The fertilized egg is called an embryo until it reaches 10 weeks. From 10 weeks until birth, it is called a fetus. The fertilized egg moves  down the fallopian tube to the uterus. Then it implants into the lining of the uterus and begins to grow. The developing fetus receives oxygen and nutrients through the pregnant woman's bloodstream and the tissues that grow (placenta) to support the fetus. The placenta is the life support system for the fetus. It provides nutrition and removes waste. Learning as much as you can about your pregnancy and how your baby is developing can help you enjoy the experience. It can also make you aware of when there might be a problem and when to ask questions. HOW LONG DOES A TYPICAL PREGNANCY LAST? A pregnancy usually lasts 280 days, or about 40 weeks. Pregnancy is divided into three trimesters:  First trimester: 0-13 weeks.  Second trimester: 14-27 weeks.  Third trimester: 28-40 weeks. The day when your baby is considered ready to be born (full term) is your estimated date of delivery. HOW DOES MY BABY DEVELOP MONTH BY MONTH? First month  The fertilized egg attaches to the inside of the uterus.  Some cells will form the placenta. Others will form the fetus.  The arms, legs, brain, spinal cord, lungs, and heart begin to develop.  At the end of the first month, the heart begins to beat. Second month  The bones, inner ear, eyelids, hands, and feet form.  The genitals develop.  By the end of 8 weeks, all major organs are developing. Third month  All of the internal organs are forming.  Teeth develop below the gums.  Bones and muscles begin to grow. The spine can flex.  The skin is transparent.  Fingernails and toenails begin to form.  Arms and legs continue to grow longer, and hands and feet develop.  The fetus is about 3 in (7.6 cm) long. Fourth month  The placenta is completely formed.  The external sex organs, neck, outer ear, eyebrows, eyelids, and fingernails are formed.  The fetus can hear, swallow, and move its arms and legs.  The kidneys begin to produce urine.  The  skin is covered with a white waxy coating (vernix) and very fine hair (lanugo). Fifth month  The fetus moves around more and can be felt for the first time (quickening).  The fetus starts to sleep and wake up and may begin to suck its finger.  The nails grow to the end of the fingers.  The organ in the digestive system that makes bile (gallbladder) functions and helps to digest the nutrients.  If your baby is a girl, eggs are present in her ovaries. If your baby is a boy, testicles start to move down into his scrotum. Sixth month  The lungs are formed, but the fetus is not yet able to breathe.  The eyes open. The brain continues to develop.  Your baby has fingerprints and toe prints. Your baby's hair grows thicker.  At the end of the second trimester, the fetus is about 9 in (22.9 cm) long. Seventh month  The fetus kicks and stretches.  The eyes are developed enough  to sense changes in light.  The hands can make a grasping motion.  The fetus responds to sound. Eighth month  All organs and body systems are fully developed and functioning.  Bones harden and taste buds develop. The fetus may hiccup.  Certain areas of the brain are still developing. The skull remains soft. Ninth month  The fetus gains about  lb (0.23 kg) each week.  The lungs are fully developed.  Patterns of sleep develop.  The fetus's head typically moves into a head-down position (vertex) in the uterus to prepare for birth. If the buttocks move into a vertex position instead, the baby is breech.  The fetus weighs 6-9 lbs (2.72-4.08 kg) and is 19-20 in (48.26-50.8 cm) long. WHAT CAN I DO TO HAVE A HEALTHY PREGNANCY AND HELP MY BABY DEVELOP? Eating and Drinking  Eat a healthy diet.  Talk with your health care provider to make sure that you are getting the nutrients that you and your baby need.  Visit www.[employer].be to learn about creating a healthy diet.  Gain a healthy amount of weight  during pregnancy as advised by your health care provider. This is usually 25-35 pounds. You may need to:  Gain more if you were underweight before getting pregnant or if you are pregnant with more than one baby.  Gain less if you were overweight or obese when you got pregnant. Medicines and Vitamins  Take prenatal vitamins as directed by your health care provider. These include vitamins such as folic acid, iron, calcium, and vitamin D. They are important for healthy development.  Take medicines only as directed by your health care provider. Read labels and ask a pharmacist or your health care provider whether over-the-counter medicines, supplements, and prescription drugs are safe to take during pregnancy. Activities  Be physically active as advised by your health care provider. Ask your health care provider to recommend activities that are safe for you to do, such as walking or swimming.  Do not participate in strenuous or extreme sports. Lifestyle  Do not drink alcohol.  Do not use any tobacco products, including cigarettes, chewing tobacco, or electronic cigarettes. If you need help quitting, ask your health care provider.  Do not use illegal drugs. Safety  Avoid exposure to mercury, lead, or other heavy metals. Ask your health care provider about common sources of these heavy metals.  Avoid listeria infection during pregnancy. Follow these precautions:  Do not eat soft cheeses or deli meats.  Do not eat hot dogs unless they have been warmed up to the point of steaming, such as in the microwave oven.  Do not drink unpasteurized milk.  Avoid toxoplasmosis infection during pregnancy. Follow these precautions:  Do not change your cat's litter box, if you have a cat. Ask someone else to do this for you.  Wear gardening gloves while working in the yard. General Instructions  Keep all follow-up visits as directed by your health care provider. This is important. This includes  prenatal care and screening tests.  Manage any chronic health conditions. Work closely with your health care provider to keep conditions, such as diabetes, under control. HOW DO I KNOW IF MY BABY IS DEVELOPING WELL? At each prenatal visit, your health care provider will do several different tests to check on your health and keep track of your baby's development. These include:  Fundal height.  Your health care provider will measure your growing belly from top to bottom using a tape measure.  Your health care  provider will also feel your belly to determine your baby's position.  Heartbeat.  An ultrasound in the first trimester can confirm pregnancy and show a heartbeat, depending on how far along you are.  Your health care provider will check your baby's heart rate at every prenatal visit.  As you get closer to your delivery date, you may have regular fetal heart rate monitoring to make sure that your baby is not in distress.  Second trimester ultrasound.  This ultrasound checks your baby's development. It also indicates your baby's gender. WHAT SHOULD I DO IF I HAVE CONCERNS ABOUT MY BABY'S DEVELOPMENT? Always talk with your health care provider about any concerns that you may have.   This information is not intended to replace advice given to you by your health care provider. Make sure you discuss any questions you have with your health care provider.   Document Released: 05/05/2008 Document Revised: 08/08/2015 Document Reviewed: 04/26/2014 Elsevier Interactive Patient Education [employer]

## 2016-07-03 ENCOUNTER — Institutional Professional Consult (permissible substitution): Payer: Self-pay

## 2016-07-15 ENCOUNTER — Telehealth: Payer: Self-pay | Admitting: *Deleted

## 2016-07-15 ENCOUNTER — Other Ambulatory Visit: Payer: Self-pay | Admitting: Certified Nurse Midwife

## 2016-07-15 ENCOUNTER — Encounter: Payer: Medicaid Other | Admitting: Certified Nurse Midwife

## 2016-07-15 DIAGNOSIS — H539 Unspecified visual disturbance: Secondary | ICD-10-CM

## 2016-07-15 NOTE — Telephone Encounter (Signed)
Please let her know that I have placed an opthalmology referral for her.  I am not sure that medicaid will pay for it.  She may need to do an out of pocket at an optometrist in town, they should have lot of going back to school specials right now.  Thank you. R.Denney CNM

## 2016-07-15 NOTE — Telephone Encounter (Signed)
Patient is calling to let Boykin ReaperRachelle know that she experienced numbness that started in her finger tips- hand, thigh and tongue. It scared her so bad that she called EMS. The symptoms only lasted 2 minutes and the EMS checked her and everything was fine. She does have a headache that has not resolved. She thinks it is her eye site and wants a referral to an eye doctor.

## 2016-07-21 NOTE — Telephone Encounter (Signed)
Patient notified per VM 

## 2016-07-29 ENCOUNTER — Ambulatory Visit (INDEPENDENT_AMBULATORY_CARE_PROVIDER_SITE_OTHER): Payer: Medicaid Other | Admitting: Certified Nurse Midwife

## 2016-07-29 ENCOUNTER — Encounter: Payer: Self-pay | Admitting: Certified Nurse Midwife

## 2016-07-29 VITALS — BP 124/80 | HR 80 | Temp 98.4°F | Wt 150.6 lb

## 2016-07-29 DIAGNOSIS — Z3403 Encounter for supervision of normal first pregnancy, third trimester: Secondary | ICD-10-CM

## 2016-07-29 DIAGNOSIS — Z3401 Encounter for supervision of normal first pregnancy, first trimester: Secondary | ICD-10-CM

## 2016-07-29 LAB — POCT URINALYSIS DIPSTICK
Bilirubin, UA: NEGATIVE
Blood, UA: NEGATIVE
Glucose, UA: NEGATIVE
Ketones, UA: NEGATIVE
Leukocytes, UA: NEGATIVE
Nitrite, UA: NEGATIVE
Protein, UA: NEGATIVE
Spec Grav, UA: 1.01
Urobilinogen, UA: 0.2
pH, UA: 6

## 2016-07-29 LAB — OB RESULTS CONSOLE GBS: GBS: POSITIVE

## 2016-07-29 NOTE — Progress Notes (Signed)
Pt denies concerns at this time. She states she lost mucus plug yesterday.

## 2016-07-29 NOTE — Progress Notes (Signed)
Subjective:    Darlene Morgan is a 20 y.o. female being seen today for her obstetrical visit. She is at 4566w4d gestation. Patient reports no bleeding, no contractions, no cramping, no leaking and and reports that she thinks she lost her mucous plug, pelvic pain. Fetal movement: normal.  Problem List Items Addressed This Visit      Other   Supervision of normal first pregnancy in first trimester   Relevant Orders   Strep Gp B NAA   NuSwab VG+, Candida 6sp    Other Visit Diagnoses    Encounter for supervision of normal first pregnancy in third trimester    -  Primary   Relevant Orders   POCT Urinalysis Dipstick (Completed)     Patient Active Problem List   Diagnosis Date Noted  . Supervision of normal first pregnancy in first trimester 01/25/2016   Objective:    BP 124/80   Pulse 80   Temp 98.4 F (36.9 C)   Wt 150 lb 9.6 oz (68.3 kg)   LMP 11/23/2015   BMI 25.06 kg/m  FHT:  145 BPM  Uterine Size: 34 cm and size equals dates  Presentation: cephalic   Cervix:  Long, thick, closed and posterior.  -3 station.   Assessment:    Pregnancy @ 8066w4d weeks   Plan:     labs reviewed, problem list updated Consent signed. GBS sent TDAP offered  Rhogam given for RH negative Pediatrician: discussed. Infant feeding: plans to breastfeed. Maternity leave: N/A. Cigarette smoking: never smoked. Orders Placed This Encounter  Procedures  . Strep Gp B NAA  . POCT Urinalysis Dipstick   No orders of the defined types were placed in this encounter.  Follow up in 1 Week.

## 2016-07-31 ENCOUNTER — Encounter (HOSPITAL_COMMUNITY): Payer: Self-pay | Admitting: *Deleted

## 2016-07-31 ENCOUNTER — Inpatient Hospital Stay (HOSPITAL_COMMUNITY)
Admission: AD | Admit: 2016-07-31 | Discharge: 2016-08-01 | Disposition: A | Discharge status: Home / Self Care | Payer: Medicaid Other | Source: Ambulatory Visit | Attending: Obstetrics and Gynecology | Admitting: Obstetrics and Gynecology

## 2016-07-31 DIAGNOSIS — Z3A36 36 weeks gestation of pregnancy: Secondary | ICD-10-CM | POA: Insufficient documentation

## 2016-07-31 DIAGNOSIS — O4703 False labor before 37 completed weeks of gestation, third trimester: Secondary | ICD-10-CM | POA: Insufficient documentation

## 2016-07-31 LAB — STREP GP B NAA: Strep Gp B NAA: POSITIVE — AB

## 2016-07-31 NOTE — MAU Note (Signed)
Pt presents to MAU with complaints of contractions for a couple of hours. Reports an increase in white discharge. Denies any vaginal bleeding

## 2016-08-01 DIAGNOSIS — O4703 False labor before 37 completed weeks of gestation, third trimester: Secondary | ICD-10-CM | POA: Diagnosis present

## 2016-08-01 DIAGNOSIS — Z3A36 36 weeks gestation of pregnancy: Secondary | ICD-10-CM | POA: Diagnosis not present

## 2016-08-01 NOTE — Discharge Instructions (Signed)
Braxton Hicks Contractions °Contractions of the uterus can occur throughout pregnancy. Contractions are not always a sign that you are in labor.  °WHAT ARE BRAXTON HICKS CONTRACTIONS?  °Contractions that occur before labor are called Braxton Hicks contractions, or false labor. Toward the end of pregnancy (32-34 weeks), these contractions can develop more often and may become more forceful. This is not true labor because these contractions do not result in opening (dilatation) and thinning of the cervix. They are sometimes difficult to tell apart from true labor because these contractions can be forceful and people have different pain tolerances. You should not feel embarrassed if you go to the hospital with false labor. Sometimes, the only way to tell if you are in true labor is for your health care provider to look for changes in the cervix. °If there are no prenatal problems or other health problems associated with the pregnancy, it is completely safe to be sent home with false labor and await the onset of true labor. °HOW CAN YOU TELL THE DIFFERENCE BETWEEN TRUE AND FALSE LABOR? °False Labor °· The contractions of false labor are usually shorter and not as hard as those of true labor.   °· The contractions are usually irregular.   °· The contractions are often felt in the front of the lower abdomen and in the groin.   °· The contractions may go away when you walk around or change positions while lying down.   °· The contractions get weaker and are shorter lasting as time goes on.   °· The contractions do not usually become progressively stronger, regular, and closer together as with true labor.   °True Labor °· Contractions in true labor last 30-70 seconds, become very regular, usually become more intense, and increase in frequency.   °· The contractions do not go away with walking.   °· The discomfort is usually felt in the top of the uterus and spreads to the lower abdomen and low back.   °· True labor can be  determined by your health care provider with an exam. This will show that the cervix is dilating and getting thinner.   °WHAT TO REMEMBER °· Keep up with your usual exercises and follow other instructions given by your health care provider.   °· Take medicines as directed by your health care provider.   °· Keep your regular prenatal appointments.   °· Eat and drink lightly if you think you are going into labor.   °· If Braxton Hicks contractions are making you uncomfortable:   °¨ Change your position from lying down or resting to walking, or from walking to resting.   °¨ Sit and rest in a tub of warm water.   °¨ Drink 2-3 glasses of water. Dehydration may cause these contractions.   °¨ Do slow and deep breathing several times an hour.   °WHEN SHOULD I SEEK IMMEDIATE MEDICAL CARE? °Seek immediate medical care if: °· Your contractions become stronger, more regular, and closer together.   °· You have fluid leaking or gushing from your vagina.   °· You have a fever.   °· You pass blood-tinged mucus.   °· You have vaginal bleeding.   °· You have continuous abdominal pain.   °· You have low back pain that you never had before.   °· You feel your baby's head pushing down and causing pelvic pressure.   °· Your baby is not moving as much as it used to.   °  °This information is not intended to replace advice given to you by your health care provider. Make sure you discuss any questions you have with your health care   provider. °  °Document Released: 11/17/2005 Document Revised: 11/22/2013 Document Reviewed: 08/29/2013 °Elsevier Interactive Patient Education ©2016 [employer] °Fetal Movement Counts °Patient Name: __________________________________________________ Patient Due Date: ____________________ °Performing a fetal movement count is highly recommended in high-risk pregnancies, but it is good for every pregnant woman to do. Your health care provider may ask you to start counting fetal movements at 28 weeks of the  pregnancy. Fetal movements often increase: °· After eating a full meal. °· After physical activity. °· After eating or drinking something sweet or cold. °· At rest. °Pay attention to when you feel the baby is most active. This will help you notice a pattern of your baby's sleep and wake cycles and what factors contribute to an increase in fetal movement. It is important to perform a fetal movement count at the same time each day when your baby is normally most active.  °HOW TO COUNT FETAL MOVEMENTS °1. Find a quiet and comfortable area to sit or lie down on your left side. Lying on your left side provides the best blood and oxygen circulation to your baby. °2. Write down the day and time on a sheet of paper or in a journal. °3. Start counting kicks, flutters, swishes, rolls, or jabs in a 2-hour period. You should feel at least 10 movements within 2 hours. °4. If you do not feel 10 movements in 2 hours, wait 2-3 hours and count again. Look for a change in the pattern or not enough counts in 2 hours. °SEEK MEDICAL CARE IF: °· You feel less than 10 counts in 2 hours, tried twice. °· There is no movement in over an hour. °· The pattern is changing or taking longer each day to reach 10 counts in 2 hours. °· You feel the baby is not moving as he or she usually does. °Date: ____________ Movements: ____________ Start time: ____________ Finish time: ____________  °Date: ____________ Movements: ____________ Start time: ____________ Finish time: ____________ °Date: ____________ Movements: ____________ Start time: ____________ Finish time: ____________ °Date: ____________ Movements: ____________ Start time: ____________ Finish time: ____________ °Date: ____________ Movements: ____________ Start time: ____________ Finish time: ____________ °Date: ____________ Movements: ____________ Start time: ____________ Finish time: ____________ °Date: ____________ Movements: ____________ Start time: ____________ Finish time:  ____________ °Date: ____________ Movements: ____________ Start time: ____________ Finish time: ____________  °Date: ____________ Movements: ____________ Start time: ____________ Finish time: ____________ °Date: ____________ Movements: ____________ Start time: ____________ Finish time: ____________ °Date: ____________ Movements: ____________ Start time: ____________ Finish time: ____________ °Date: ____________ Movements: ____________ Start time: ____________ Finish time: ____________ °Date: ____________ Movements: ____________ Start time: ____________ Finish time: ____________ °Date: ____________ Movements: ____________ Start time: ____________ Finish time: ____________ °Date: ____________ Movements: ____________ Start time: ____________ Finish time: ____________  °Date: ____________ Movements: ____________ Start time: ____________ Finish time: ____________ °Date: ____________ Movements: ____________ Start time: ____________ Finish time: ____________ °Date: ____________ Movements: ____________ Start time: ____________ Finish time: ____________ °Date: ____________ Movements: ____________ Start time: ____________ Finish time: ____________ °Date: ____________ Movements: ____________ Start time: ____________ Finish time: ____________ °Date: ____________ Movements: ____________ Start time: ____________ Finish time: ____________ °Date: ____________ Movements: ____________ Start time: ____________ Finish time: ____________  °Date: ____________ Movements: ____________ Start time: ____________ Finish time: ____________ °Date: ____________ Movements: ____________ Start time: ____________ Finish time: ____________ °Date: ____________ Movements: ____________ Start time: ____________ Finish time: ____________ °Date: ____________ Movements: ____________ Start time: ____________ Finish time: ____________ °Date: ____________ Movements: ____________ Start time: ____________ Finish time: ____________ °Date: ____________ Movements:  ____________ Start time: ____________ Finish time:   ____________ °Date: ____________ Movements: ____________ Start time: ____________ Finish time: ____________  °Date: ____________ Movements: ____________ Start time: ____________ Finish time: ____________ °Date: ____________ Movements: ____________ Start time: ____________ Finish time: ____________ °Date: ____________ Movements: ____________ Start time: ____________ Finish time: ____________ °Date: ____________ Movements: ____________ Start time: ____________ Finish time: ____________ °Date: ____________ Movements: ____________ Start time: ____________ Finish time: ____________ °Date: ____________ Movements: ____________ Start time: ____________ Finish time: ____________ °Date: ____________ Movements: ____________ Start time: ____________ Finish time: ____________  °Date: ____________ Movements: ____________ Start time: ____________ Finish time: ____________ °Date: ____________ Movements: ____________ Start time: ____________ Finish time: ____________ °Date: ____________ Movements: ____________ Start time: ____________ Finish time: ____________ °Date: ____________ Movements: ____________ Start time: ____________ Finish time: ____________ °Date: ____________ Movements: ____________ Start time: ____________ Finish time: ____________ °Date: ____________ Movements: ____________ Start time: ____________ Finish time: ____________ °Date: ____________ Movements: ____________ Start time: ____________ Finish time: ____________  °Date: ____________ Movements: ____________ Start time: ____________ Finish time: ____________ °Date: ____________ Movements: ____________ Start time: ____________ Finish time: ____________ °Date: ____________ Movements: ____________ Start time: ____________ Finish time: ____________ °Date: ____________ Movements: ____________ Start time: ____________ Finish time: ____________ °Date: ____________ Movements: ____________ Start time: ____________ Finish  time: ____________ °Date: ____________ Movements: ____________ Start time: ____________ Finish time: ____________ °Date: ____________ Movements: ____________ Start time: ____________ Finish time: ____________  °Date: ____________ Movements: ____________ Start time: ____________ Finish time: ____________ °Date: ____________ Movements: ____________ Start time: ____________ Finish time: ____________ °Date: ____________ Movements: ____________ Start time: ____________ Finish time: ____________ °Date: ____________ Movements: ____________ Start time: ____________ Finish time: ____________ °Date: ____________ Movements: ____________ Start time: ____________ Finish time: ____________ °Date: ____________ Movements: ____________ Start time: ____________ Finish time: ____________ °  °This information is not intended to replace advice given to you by your health care provider. Make sure you discuss any questions you have with your health care provider. °  °Document Released: 12/17/2006 Document Revised: 12/08/2014 Document Reviewed: 09/13/2012 °Elsevier Interactive Patient Education ©2016 [employer]vier Inc[employer] ° °

## 2016-08-01 NOTE — Progress Notes (Signed)
Drenda FreezeFran Cresenzo-Dishmon CNM notified of pt's complaints, orders received to discharge home when FHR reactive

## 2016-08-05 ENCOUNTER — Other Ambulatory Visit: Payer: Self-pay | Admitting: Certified Nurse Midwife

## 2016-08-06 ENCOUNTER — Ambulatory Visit (INDEPENDENT_AMBULATORY_CARE_PROVIDER_SITE_OTHER): Payer: Medicaid Other | Admitting: Certified Nurse Midwife

## 2016-08-06 VITALS — BP 122/79 | HR 97 | Wt 151.0 lb

## 2016-08-06 DIAGNOSIS — Z3403 Encounter for supervision of normal first pregnancy, third trimester: Secondary | ICD-10-CM

## 2016-08-06 NOTE — Progress Notes (Signed)
Subjective:    Jilda RocheJada Teska is a 20 y.o. female being seen today for her obstetrical visit. She is at 275w5d gestation. Patient reports no complaints. Fetal movement: normal.  Problem List Items Addressed This Visit    None    Visit Diagnoses    Encounter for supervision of normal first pregnancy in third trimester    -  Primary   Relevant Orders   POCT urinalysis dipstick     Patient Active Problem List   Diagnosis Date Noted  . Supervision of normal first pregnancy in first trimester 01/25/2016    Objective:    BP 122/79   Pulse 97   Wt 151 lb (68.5 kg)   LMP 11/23/2015   BMI 25.13 kg/m  FHT: 145 BPM  Uterine Size: 36 cm and size equals dates  Presentations: cephalic  Pelvic Exam: deferred     Assessment:    Pregnancy @ 2075w5d weeks   GBS+  Plan:   Plans for delivery: Vaginal anticipated; labs reviewed; problem list updated Counseling: Consent signed. Infant feeding: plans to breastfeed. Cigarette smoking: never smoked. L&D discussion: symptoms of labor, discussed when to call, discussed what number to call, anesthetic/analgesic options reviewed and delivering clinician:  plans Certified Nurse-Midwife. Postpartum supports and preparation: circumcision discussed and contraception plans discussed.  Follow up in 1 Week.

## 2016-08-07 LAB — NUSWAB VG+, CANDIDA 6SP
Candida albicans, NAA: NEGATIVE
Candida glabrata, NAA: NEGATIVE
Candida krusei, NAA: NEGATIVE
Candida lusitaniae, NAA: NEGATIVE
Candida parapsilosis, NAA: NEGATIVE
Candida tropicalis, NAA: NEGATIVE
Chlamydia trachomatis, NAA: NEGATIVE
Neisseria gonorrhoeae, NAA: NEGATIVE
Trich vag by NAA: NEGATIVE

## 2016-08-14 ENCOUNTER — Ambulatory Visit: Payer: Medicaid Other | Admitting: Advanced Practice Midwife

## 2016-08-14 VITALS — BP 113/75 | HR 94 | Wt 156.0 lb

## 2016-08-15 ENCOUNTER — Encounter: Payer: Self-pay | Admitting: Obstetrics

## 2016-08-15 ENCOUNTER — Ambulatory Visit (INDEPENDENT_AMBULATORY_CARE_PROVIDER_SITE_OTHER): Payer: Medicaid Other | Admitting: Obstetrics

## 2016-08-15 VITALS — BP 121/84 | HR 94 | Temp 98.0°F | Wt 154.9 lb

## 2016-08-15 DIAGNOSIS — Z3403 Encounter for supervision of normal first pregnancy, third trimester: Secondary | ICD-10-CM

## 2016-08-15 NOTE — Progress Notes (Signed)
Subjective:    Darlene RocheJada Morgan is a 20 y.o. female being seen today for her obstetrical visit. She is at 2374w0d gestation. Patient reports no complaints. Fetal movement: normal.  Problem List Items Addressed This Visit    None    Visit Diagnoses   None.    Patient Active Problem List   Diagnosis Date Noted  . Supervision of normal first pregnancy in first trimester 01/25/2016    Objective:    BP 121/84   Pulse 94   Temp 98 F (36.7 C)   Wt 154 lb 14.4 oz (70.3 kg)   LMP 11/23/2015   BMI 25.78 kg/m  FHT: 150 BPM  Uterine Size: size equals dates  Presentations: cephalic    Assessment:    Pregnancy @ 7074w0d weeks   Plan:   Plans for delivery: Vaginal anticipated; labs reviewed; problem list updated Counseling: Consent signed. Infant feeding: plans to breastfeed. Cigarette smoking: former smoker. L&D discussion: symptoms of labor, discussed when to call, discussed what number to call, anesthetic/analgesic options reviewed and delivering clinician:  plans no preference. Postpartum supports and preparation: circumcision discussed and contraception plans discussed.  Follow up in 1 Week.

## 2016-08-26 ENCOUNTER — Encounter: Payer: Self-pay | Admitting: Obstetrics & Gynecology

## 2016-08-26 ENCOUNTER — Ambulatory Visit (INDEPENDENT_AMBULATORY_CARE_PROVIDER_SITE_OTHER): Payer: Medicaid Other | Admitting: Obstetrics & Gynecology

## 2016-08-26 VITALS — BP 130/88 | HR 86 | Temp 98.5°F | Wt 156.2 lb

## 2016-08-26 DIAGNOSIS — Z3403 Encounter for supervision of normal first pregnancy, third trimester: Secondary | ICD-10-CM | POA: Diagnosis not present

## 2016-08-26 LAB — POCT URINALYSIS DIPSTICK
Bilirubin, UA: NEGATIVE
Blood, UA: NEGATIVE
Glucose, UA: NEGATIVE
Ketones, UA: NEGATIVE
Nitrite, UA: NEGATIVE
Protein, UA: NEGATIVE
Spec Grav, UA: <=1.005
Urobilinogen, UA: NEGATIVE
pH, UA: 7

## 2016-08-26 NOTE — Progress Notes (Signed)
Patient states that this week she has been having irregular contractions with pressure that comes and goes. She states that she has a discharge but is not sure if it is amniotic fluid.

## 2016-08-26 NOTE — Progress Notes (Signed)
   PRENATAL VISIT NOTE  Subjective:  Darlene Morgan is a 20 y.o. G1P0 at 2178w4d being seen today for ongoing prenatal care.  She is currently monitored for the following issues for this low-risk pregnancy and has Supervision of normal first pregnancy, antepartum on her problem list.  Patient reports occasional contractions and scant d/c.  Contractions: Irregular. Vag. Bleeding: None.  Movement: Present. Denies leaking of fluid.   The following portions of the patient's history were reviewed and updated as appropriate: allergies, current medications, past family history, past medical history, past social history, past surgical history and problem list. Problem list updated.  Objective:   Vitals:   08/26/16 1433  BP: 130/88  Pulse: 86  Temp: 98.5 F (36.9 C)  Weight: 156 lb 3.2 oz (70.9 kg)    Fetal Status:     Movement: Present     General:  Alert, oriented and cooperative. Patient is in no acute distress.  Skin: Skin is warm and dry. No rash noted.   Cardiovascular: Normal heart rate noted  Respiratory: Normal respiratory effort, no problems with respiration noted  Abdomen: Soft, gravid, appropriate for gestational age. Pain/Pressure: Present     Pelvic:  Cervical exam performed        Extremities: Normal range of motion.  Edema: None  Mental Status: Normal mood and affect. Normal behavior. Normal judgment and thought content.   Urinalysis:      Assessment and Plan:  Pregnancy: G1P0 at 7678w4d  1. Encounter for supervision of normal first pregnancy in third trimester Borderline BP - POCT urinalysis dipstick  2. Supervision of normal first pregnancy, antepartum, third trimester RTC 3 days for BP check  Term labor symptoms and general obstetric precautions including but not limited to vaginal bleeding, contractions, leaking of fluid and fetal movement were reviewed in detail with the patient. Please refer to After Visit Summary for other counseling recommendations.  Return in  about 3 days (around 08/29/2016).  Adam PhenixJames G Arnold, MD

## 2016-08-29 ENCOUNTER — Telehealth: Payer: Self-pay | Admitting: *Deleted

## 2016-08-29 ENCOUNTER — Other Ambulatory Visit: Payer: Self-pay | Admitting: Certified Nurse Midwife

## 2016-08-29 ENCOUNTER — Ambulatory Visit: Payer: Medicaid Other

## 2016-08-29 ENCOUNTER — Encounter: Payer: Self-pay | Admitting: Obstetrics

## 2016-08-29 VITALS — BP 123/81 | HR 66 | Temp 98.1°F

## 2016-08-29 DIAGNOSIS — Z3403 Encounter for supervision of normal first pregnancy, third trimester: Secondary | ICD-10-CM

## 2016-08-29 NOTE — Telephone Encounter (Signed)
Patient called concerned about her EDD and her conception dating. She is not sure if she has chosen the correct partner because her exposure is so close. Reviewed dating with patient and also discussed her options as far as DNA testing. Reassure patient that she should not worry at this point- she should focus on her birth experience and having a wonderful time welcoming her baby. All the other can be handled after the birth.

## 2016-08-29 NOTE — Telephone Encounter (Signed)
Mel AlmondJada called and left a voicemail this am stating she wants to speak to someone about the date she was given that her baby was conceived.

## 2016-08-29 NOTE — Progress Notes (Unsigned)
Patient in office for BP check. Patient refused NST at this stated she had another Dr. Alfonzo BeersAppt. Today. Patient scheduled to come back next week for NST and schedule possible induction.

## 2016-09-02 ENCOUNTER — Ambulatory Visit (INDEPENDENT_AMBULATORY_CARE_PROVIDER_SITE_OTHER): Payer: Medicaid Other | Admitting: Certified Nurse Midwife

## 2016-09-02 VITALS — BP 126/85 | HR 87 | Temp 98.1°F | Wt 156.4 lb

## 2016-09-02 DIAGNOSIS — Z3403 Encounter for supervision of normal first pregnancy, third trimester: Secondary | ICD-10-CM | POA: Diagnosis not present

## 2016-09-02 DIAGNOSIS — Z3A40 40 weeks gestation of pregnancy: Secondary | ICD-10-CM

## 2016-09-02 DIAGNOSIS — O480 Post-term pregnancy: Secondary | ICD-10-CM

## 2016-09-02 NOTE — Telephone Encounter (Signed)
[place] [facility] She has spoken with them about dating.

## 2016-09-02 NOTE — Progress Notes (Signed)
Subjective:    Darlene RocheJada Morgan is a 20 y.o. female being seen today for her obstetrical visit. She is at 3431w4d gestation. Patient reports no complaints. Fetal movement: normal.  Problem List Items Addressed This Visit    None    Visit Diagnoses   None.    Patient Active Problem List   Diagnosis Date Noted  . Supervision of normal first pregnancy, antepartum 01/25/2016    Objective:    BP 126/85   Pulse 87   Temp 98.1 F (36.7 C)   Wt 156 lb 6.4 oz (70.9 kg)   LMP 11/23/2015   BMI 26.03 kg/m  FHT:  150 BPM  Uterine Size: 40 cm and size equals dates  Presentation: cephalic  Pelvic Exam:              Dilation: Closed       Effacement: Long   Station:  -3     Consistency: medium            Position: posterior    NST: + accels, no decels, moderate variability, Cat. 1 tracing. contractions about every 10 minutes on toco.  Assessment:    Pregnancy @ 3131w4d  weeks   Post dates  Reactive NST  Plan:    Postdates management: discussed fetal surveillance and induction, discussed fetal movement, NST reactive, biophysical profile ordered. Induction: scheduled for 09/04/16, written information given.  Follow up in 2 weeks postpartum.

## 2016-09-03 ENCOUNTER — Telehealth (HOSPITAL_COMMUNITY): Payer: Self-pay | Admitting: *Deleted

## 2016-09-03 ENCOUNTER — Encounter (HOSPITAL_COMMUNITY): Payer: Self-pay | Admitting: *Deleted

## 2016-09-03 NOTE — Addendum Note (Signed)
Addended by: Samantha CrimesENNEY, Corene Resnick ANNE on: 09/03/2016 08:58 AM   Modules accepted: Orders, SmartSet

## 2016-09-03 NOTE — Telephone Encounter (Signed)
Preadmission screen  

## 2016-09-04 ENCOUNTER — Inpatient Hospital Stay (HOSPITAL_COMMUNITY)
Admission: RE | Admit: 2016-09-04 | Discharge: 2016-09-06 | DRG: 775 | Disposition: A | Discharge status: Home / Self Care | Payer: Medicaid Other | Source: Ambulatory Visit | Attending: Obstetrics & Gynecology | Admitting: Obstetrics & Gynecology

## 2016-09-04 ENCOUNTER — Inpatient Hospital Stay (HOSPITAL_COMMUNITY): Payer: Medicaid Other | Admitting: Anesthesiology

## 2016-09-04 ENCOUNTER — Encounter (HOSPITAL_COMMUNITY): Payer: Self-pay

## 2016-09-04 DIAGNOSIS — Z3A41 41 weeks gestation of pregnancy: Secondary | ICD-10-CM | POA: Diagnosis not present

## 2016-09-04 DIAGNOSIS — O480 Post-term pregnancy: Secondary | ICD-10-CM | POA: Diagnosis present

## 2016-09-04 DIAGNOSIS — IMO0002 Reserved for concepts with insufficient information to code with codable children: Secondary | ICD-10-CM | POA: Diagnosis present

## 2016-09-04 DIAGNOSIS — O99824 Streptococcus B carrier state complicating childbirth: Secondary | ICD-10-CM | POA: Diagnosis present

## 2016-09-04 DIAGNOSIS — J45909 Unspecified asthma, uncomplicated: Secondary | ICD-10-CM | POA: Diagnosis present

## 2016-09-04 DIAGNOSIS — O9952 Diseases of the respiratory system complicating childbirth: Secondary | ICD-10-CM | POA: Diagnosis present

## 2016-09-04 DIAGNOSIS — Z87891 Personal history of nicotine dependence: Secondary | ICD-10-CM | POA: Diagnosis not present

## 2016-09-04 DIAGNOSIS — Z8249 Family history of ischemic heart disease and other diseases of the circulatory system: Secondary | ICD-10-CM | POA: Diagnosis not present

## 2016-09-04 DIAGNOSIS — Z3A40 40 weeks gestation of pregnancy: Secondary | ICD-10-CM | POA: Diagnosis not present

## 2016-09-04 LAB — TYPE AND SCREEN
ABO/RH(D): O POS
Antibody Screen: NEGATIVE

## 2016-09-04 LAB — CBC
HCT: 35.3 % — ABNORMAL LOW (ref 36.0–46.0)
Hemoglobin: 12.3 g/dL (ref 12.0–15.0)
MCH: 29.3 pg (ref 26.0–34.0)
MCHC: 34.8 g/dL (ref 30.0–36.0)
MCV: 84 fL (ref 78.0–100.0)
Platelets: 279 10*3/uL (ref 150–400)
RBC: 4.2 MIL/uL (ref 3.87–5.11)
RDW: 14.1 % (ref 11.5–15.5)
WBC: 7.1 10*3/uL (ref 4.0–10.5)

## 2016-09-04 LAB — COMPREHENSIVE METABOLIC PANEL
ALT: 18 U/L (ref 14–54)
AST: 23 U/L (ref 15–41)
Albumin: 3.2 g/dL — ABNORMAL LOW (ref 3.5–5.0)
Alkaline Phosphatase: 246 U/L — ABNORMAL HIGH (ref 38–126)
Anion gap: 8 (ref 5–15)
BUN: 9 mg/dL (ref 6–20)
CO2: 21 mmol/L — ABNORMAL LOW (ref 22–32)
Calcium: 9.4 mg/dL (ref 8.9–10.3)
Chloride: 104 mmol/L (ref 101–111)
Creatinine, Ser: 0.8 mg/dL (ref 0.44–1.00)
GFR calc Af Amer: >60 mL/min (ref >60)
GFR calc non Af Amer: >60 mL/min (ref >60)
Glucose, Bld: 75 mg/dL (ref 65–99)
Potassium: 4 mmol/L (ref 3.5–5.1)
Sodium: 133 mmol/L — ABNORMAL LOW (ref 135–145)
Total Bilirubin: 0.4 mg/dL (ref 0.3–1.2)
Total Protein: 6.9 g/dL (ref 6.5–8.1)

## 2016-09-04 LAB — ABO/RH: ABO/RH(D): O POS

## 2016-09-04 MED ORDER — OXYTOCIN 40 UNITS IN LACTATED RINGERS INFUSION - SIMPLE MED: 2.5000 [IU]/h | INTRAVENOUS | Status: DC

## 2016-09-04 MED ORDER — PENICILLIN G POTASSIUM 5000000 UNITS IJ SOLR: 2.5000 10*6.[IU] | INTRAVENOUS | Status: DC

## 2016-09-04 MED ORDER — ACETAMINOPHEN 325 MG PO TABS: 650.0000 mg | ORAL_TABLET | ORAL | Status: DC | PRN

## 2016-09-04 MED ORDER — FENTANYL CITRATE (PF) 100 MCG/2ML IJ SOLN
100.0000 ug | INTRAMUSCULAR | Status: DC | PRN
  Administered 2016-09-04 (×2): 100 ug via INTRAVENOUS
  Filled 2016-09-04 (×2): qty 2

## 2016-09-04 MED ORDER — EPHEDRINE 5 MG/ML INJ
10.0000 mg | INTRAVENOUS | Status: DC | PRN
  Filled 2016-09-04: qty 4

## 2016-09-04 MED ORDER — LIDOCAINE HCL (PF) 1 % IJ SOLN
30.0000 mL | INTRAMUSCULAR | Status: DC | PRN
  Filled 2016-09-04: qty 30

## 2016-09-04 MED ORDER — TERBUTALINE SULFATE 1 MG/ML IJ SOLN
0.2500 mg | Freq: Once | INTRAMUSCULAR | Status: DC | PRN
Start: 2016-09-04 — End: 2016-09-05
  Filled 2016-09-04: qty 1

## 2016-09-04 MED ORDER — LIDOCAINE HCL (PF) 1 % IJ SOLN: 30.0000 mL | INTRAMUSCULAR | Status: DC | PRN

## 2016-09-04 MED ORDER — PHENYLEPHRINE 40 MCG/ML (10ML) SYRINGE FOR IV PUSH (FOR BLOOD PRESSURE SUPPORT)
PREFILLED_SYRINGE | INTRAVENOUS | Status: DC
Start: 2016-09-04 — End: 2016-09-05
  Filled 2016-09-04: qty 20

## 2016-09-04 MED ORDER — LACTATED RINGERS IV SOLN
INTRAVENOUS | Status: DC
  Administered 2016-09-04: 14:00:00 via INTRAVENOUS

## 2016-09-04 MED ORDER — OXYTOCIN BOLUS FROM INFUSION: 500.0000 mL | Freq: Once | INTRAVENOUS | Status: DC

## 2016-09-04 MED ORDER — FENTANYL 2.5 MCG/ML BUPIVACAINE 1/10 % EPIDURAL INFUSION (WH - ANES)
INTRAMUSCULAR | Status: AC
  Administered 2016-09-04: 22:00:00
  Filled 2016-09-04: qty 125

## 2016-09-04 MED ORDER — SOD CITRATE-CITRIC ACID 500-334 MG/5ML PO SOLN
30.0000 mL | ORAL | Status: DC | PRN
Start: 2016-09-04 — End: 2016-09-05

## 2016-09-04 MED ORDER — ONDANSETRON HCL 4 MG/2ML IJ SOLN: 4.0000 mg | Freq: Four times a day (QID) | INTRAMUSCULAR | Status: DC | PRN

## 2016-09-04 MED ORDER — FENTANYL 2.5 MCG/ML BUPIVACAINE 1/10 % EPIDURAL INFUSION (WH - ANES)
14.0000 mL/h | INTRAMUSCULAR | Status: DC | PRN
Start: 2016-09-04 — End: 2016-09-05
  Administered 2016-09-04 – 2016-09-05 (×2): 14 mL/h via EPIDURAL
  Filled 2016-09-04: qty 125

## 2016-09-04 MED ORDER — PENICILLIN G POTASSIUM 5000000 UNITS IJ SOLR
5.0000 10*6.[IU] | Freq: Once | INTRAVENOUS | Status: AC
  Administered 2016-09-04: 5 10*6.[IU] via INTRAVENOUS
  Filled 2016-09-04: qty 5

## 2016-09-04 MED ORDER — LACTATED RINGERS IV SOLN: 500.0000 mL | INTRAVENOUS | Status: DC | PRN

## 2016-09-04 MED ORDER — OXYTOCIN BOLUS FROM INFUSION
500.0000 mL | Freq: Once | INTRAVENOUS | Status: DC
Start: 2016-09-04 — End: 2016-09-04

## 2016-09-04 MED ORDER — LIDOCAINE HCL (PF) 1 % IJ SOLN
INTRAMUSCULAR | Status: DC | PRN
  Administered 2016-09-04: 4 mL
  Administered 2016-09-04: 6 mL via EPIDURAL

## 2016-09-04 MED ORDER — OXYTOCIN 40 UNITS IN LACTATED RINGERS INFUSION - SIMPLE MED
2.5000 [IU]/h | INTRAVENOUS | Status: DC
  Administered 2016-09-05: 2.5 [IU]/h via INTRAVENOUS
  Filled 2016-09-04: qty 1000

## 2016-09-04 MED ORDER — LACTATED RINGERS IV SOLN
500.0000 mL | Freq: Once | INTRAVENOUS | Status: AC
  Administered 2016-09-04: 500 mL via INTRAVENOUS

## 2016-09-04 MED ORDER — PHENYLEPHRINE 40 MCG/ML (10ML) SYRINGE FOR IV PUSH (FOR BLOOD PRESSURE SUPPORT)
80.0000 ug | PREFILLED_SYRINGE | INTRAVENOUS | Status: DC | PRN
  Filled 2016-09-04: qty 5

## 2016-09-04 MED ORDER — ALBUTEROL SULFATE (2.5 MG/3ML) 0.083% IN NEBU: 3.0000 mL | INHALATION_SOLUTION | Freq: Four times a day (QID) | RESPIRATORY_TRACT | Status: DC | PRN

## 2016-09-04 MED ORDER — DIPHENHYDRAMINE HCL 50 MG/ML IJ SOLN
12.5000 mg | INTRAMUSCULAR | Status: DC | PRN
  Administered 2016-09-05: 12.5 mg via INTRAVENOUS
  Filled 2016-09-04: qty 1

## 2016-09-04 MED ORDER — SOD CITRATE-CITRIC ACID 500-334 MG/5ML PO SOLN: 30.0000 mL | ORAL | Status: DC | PRN

## 2016-09-04 MED ORDER — MISOPROSTOL 25 MCG QUARTER TABLET
25.0000 ug | ORAL_TABLET | ORAL | Status: DC | PRN
  Administered 2016-09-04 (×2): 25 ug via VAGINAL
  Filled 2016-09-04 (×3): qty 0.25
  Filled 2016-09-04: qty 1

## 2016-09-04 MED ORDER — OXYTOCIN 10 UNIT/ML IJ SOLN: 10.0000 [IU] | Freq: Once | INTRAMUSCULAR | Status: DC

## 2016-09-04 MED ORDER — LACTATED RINGERS IV SOLN
INTRAVENOUS | Status: DC
  Administered 2016-09-04: 125 mL via INTRAVENOUS

## 2016-09-04 MED ORDER — PENICILLIN G POTASSIUM 5000000 UNITS IJ SOLR: 5.0000 10*6.[IU] | Freq: Once | INTRAVENOUS | Status: DC

## 2016-09-04 MED ORDER — PENICILLIN G POTASSIUM 5000000 UNITS IJ SOLR
2.5000 10*6.[IU] | INTRAVENOUS | Status: DC
  Administered 2016-09-04 – 2016-09-05 (×4): 2.5 10*6.[IU] via INTRAVENOUS
  Filled 2016-09-04 (×7): qty 2.5

## 2016-09-04 NOTE — H&P (Signed)
LABOR AND DELIVERY ADMISSION HISTORY AND PHYSICAL NOTE  Darlene Morgan is a 20 y.o. female G1P0000 with IUP at 2453w6d by LMP and 17 week US presenting for PDIOL.   Reported feeling contractions yesterday and last night over 10 minutes apart. Felt like minor cramping.  She reports positive fetal movement. She reports feeling some leakage of fluids but no gush of fluids. No vaginal bleeding.  Prenatal History/Complications:  Past Medical History: Past Medical History:  Diagnosis Date  . Asthma   . Heart murmur    pt states small   . Supervision of normal first pregnancy, antepartum 01/25/2016    Clinic [facility] Prenatal Labs Dating LMP = 17 wk u/s Blood type: O/POS/-- (02/24 1537)  Genetic Screen 1 Screen:    AFP:     Quad:     NIPS: Antibody:NEG (02/24 1537) Anatomic US WNL female Rubella: 2.05 (02/24 1537) GTT Third trimester: 77/63/69 RPR: Non Reactive (07/05 0840)  Flu vaccine  HBsAg: NEGATIVE (02/24 1537)  TDaP vaccine                                               HIV: Non Reactive (07/05 0840)  Baby Food               Breast                                GBS: (For PCN allergy, check sensitivities) Contraception  Depo injections Pap: Circumcision NA  Pediatrician  [facility] [place]  Support Person        Past Surgical History: Past Surgical History:  Procedure Laterality Date  . TONSILLECTOMY    . TONSILLECTOMY      Obstetrical History: OB History    Gravida Para Term Preterm AB Living   1 0 0 0 0 0   SAB TAB Ectopic Multiple Live Births   0 0 0 0 0      Social History: Social History   Social History  . Marital status: Single    Spouse name: N/A  . Number of children: N/A  . Years of education: N/A   Social History Main Topics  . Smoking status: Former Smoker    Types: Cigarettes  . Smokeless tobacco: Never Used  . Alcohol use No  . Drug use:     Frequency: 1.0 time per week    Types: Marijuana     Comment: last used 01/2016  . Sexual activity: Not Asked    Other Topics Concern  . None   Social History Narrative  . None    Family History: Family History  Problem Relation Age of Onset  . Hypertension Other   . Hypertension Maternal Grandmother   . Thyroid disease Maternal Grandmother     Allergies: Allergies  Allergen Reactions  . Hydrocodone Itching    Prescriptions Prior to Admission  Medication Sig Dispense Refill Last Dose  . albuterol (PROVENTIL HFA;VENTOLIN HFA) 108 (90 Base) MCG/ACT inhaler Inhale 2 puffs into the lungs every 6 (six) hours as needed for wheezing or shortness of breath.   Not Taking  . cyclobenzaprine (FLEXERIL) 10 MG tablet Take 1 tablet (10 mg total) by mouth 3 (three) times daily as needed for muscle spasms. (Patient not taking: Reported on 09/02/2016) 20 tablet 0 Not Taking  .  omeprazole (PRILOSEC) 20 MG capsule Take 1 capsule (20 mg total) by mouth 2 (two) times daily before a meal. 60 capsule 5 Taking  . Pren-Fe-Meth-FA-Omeg w/o A (PRIMACARE) 30-1-470 MG CAPS Take 1 capsule by mouth daily.   Taking    Review of Systems   All systems reviewed and negative except as stated in HPI  Last menstrual period 11/23/2015. General appearance: alert, cooperative and no distress Lungs: no respiratory distress Heart: regular rate Abdomen: soft, non-tender Extremities: No calf swelling or tenderness Presentation: cephalic  Fetal monitoring: baseline 140s, moderate variability, + accels, no decelerations Uterine activity: every 8 minutes      Prenatal labs: ABO, Rh: O/POS/-- (02/24 1537) Antibody: NEG (02/24 1537) Rubella: immune RPR: Non Reactive (07/05 0840)  HBsAg: NEGATIVE (02/24 1537)  HIV: Non Reactive (07/05 0840)  GBS: Positive (08/29 1557)    Prenatal Transfer Tool  Maternal Diabetes: No Genetic Screening: Normal Maternal Ultrasounds/Referrals: Normal Fetal Ultrasounds or other Referrals:  Referred to Materal Fetal Medicine  Maternal Substance Abuse:  Yes:  Type: Marijuana Significant  Maternal Medications:  Meds include: Nexium Significant Maternal Lab Results: None  No results found for this or any previous visit (from the past 24 hour(s)).  Patient Active Problem List   Diagnosis Date Noted  . Post term pregnancy at [redacted] weeks gestation 09/04/2016  . Encounter for trial of labor 09/04/2016  . Supervision of normal first pregnancy, antepartum 01/25/2016    Assessment: Darlene Morgan is a 20 y.o. G1P0000 at [redacted]w[redacted]d here for post date induction of labor  #Labor: IOL with cytotec transition to foley and then pit #Pain: epidural #FWB: Category 1 #ID:  GBS + on PCN #MOF: breast #MOC:depo #Circ:  n/a  Tillman Sers, DO PGY-1 10/5/20179:29 AM  OB FELLOW HISTORY AND PHYSICAL ATTESTATION  I have seen and examined this patient; I agree with above documentation in the resident's note.    Darlene Morgan 09/04/2016, 10:22 AM

## 2016-09-04 NOTE — Anesthesia Procedure Notes (Signed)

## 2016-09-04 NOTE — Anesthesia Preprocedure Evaluation (Addendum)
Anesthesia Evaluation  Patient identified by MRN, date of birth, ID band Patient awake    Reviewed: Allergy & Precautions, H&P , Patient's Chart, lab work & pertinent test results  Airway Mallampati: II TM Distance: >3 FB Neck ROM: full    Dental  (+) Teeth Intact   Pulmonary asthma , former smoker,  breath sounds clear to auscultation        Cardiovascular Rhythm:regular Rate:Normal     Neuro/Psych    GI/Hepatic   Endo/Other    Renal/GU      Musculoskeletal   Abdominal   Peds  Hematology   Anesthesia Other Findings       Reproductive/Obstetrics (+) Pregnancy                           Anesthesia Physical Anesthesia Plan  ASA: II  Anesthesia Plan: Epidural   Post-op Pain Management:    Induction:   Airway Management Planned:   Additional Equipment:   Intra-op Plan:   Post-operative Plan:   Informed Consent: I have reviewed the patients History and Physical, chart, labs and discussed the procedure including the risks, benefits and alternatives for the proposed anesthesia with the patient or authorized representative who has indicated his/her understanding and acceptance.   Dental Advisory Given  Plan Discussed with:   Anesthesia Plan Comments: (Labs checked- platelets confirmed with RN in room. Fetal heart tracing, per RN, reported to be stable enough for sitting procedure. Discussed epidural, and patient consents to the procedure:  included risk of possible headache,backache, failed block, allergic reaction, and nerve injury. This patient was asked if she had any questions or concerns before the procedure started.)        Anesthesia Quick Evaluation  

## 2016-09-04 NOTE — Progress Notes (Signed)
Pt seen and evaluated. Experiencing mild cramping at this time. No other concerns. Cervix 1/50/-3. Foley balloon placed. Contracting to often to place another cytotec.

## 2016-09-04 NOTE — Progress Notes (Signed)
   Darlene AlmondJada Morgan is a 20 y.o. G1P0000 at 4531w6d  admitted for induction of labor due to Post dates..  Subjective:  IV meds helping some w/ctx.  Likes option of getting in bathtub  Objective: Vitals:   09/04/16 1308 09/04/16 1600 09/04/16 1650 09/04/16 1807  BP: 132/73 128/74 132/76 (!) 133/92  Pulse: 92 100 90 87  Resp:  16    Temp:  98 F (36.7 C) 97.8 F (36.6 C) 98 F (36.7 C)  TempSrc:  Oral  Oral  Weight:      Height:       No intake/output data recorded.  FHT:  FHR: 145 bpm, variability: moderate,  accelerations:  Present,  decelerations:  Absent UC:   regular, every 1-2 minutes SVE:   Dilation: 1 Effacement (%): 60 Station: -2 Exam by:: Dr. Berton LanShank   Foley in plac Labs: Lab Results  Component Value Date   WBC 7.1 09/04/2016   HGB 12.3 09/04/2016   HCT 35.3 (L) 09/04/2016   MCV 84.0 09/04/2016   PLT 279 09/04/2016    Assessment / Plan: IOL for postdates,. ripening phase.  May get in tub prn  Labor: Progressing normally Fetal Wellbeing:  Category I Pain Control:  IV pain meds Anticipated MOD:  NSVD  CRESENZO-DISHMAN,Anjani Feuerborn 09/04/2016, 8:25 PM

## 2016-09-04 NOTE — Anesthesia Pain Management Evaluation Note (Signed)
  CRNA Pain Management Visit Note  Patient: Darlene RocheJada Morgan, 20 y.o., female  "Hello I am a member of the anesthesia team at [facility]. We have an anesthesia team available at all times to provide care throughout the hospital, including epidural management and anesthesia for C-section. I don't know your plan for the delivery whether it a natural birth, water birth, IV sedation, nitrous supplementation, doula or epidural, but we want to meet your pain goals."   1.Was your pain managed to your expectations on prior hospitalizations?     2.What is your expectation for pain management during this hospitalization?       3.How can we help you reach that goal?   Record the patient's initial score and the patient's pain goal.   Pain: 0  Pain Goal: 5 [facility] wants you to be able to say your pain was always managed very well.  Darlene Morgan 09/04/2016

## 2016-09-04 NOTE — Progress Notes (Signed)
Patient coping well; resting in bed with her eyes closed. Cervix closed and 80%.  25 mcg of cytotec placed.

## 2016-09-04 NOTE — Progress Notes (Signed)
   Darlene AlmondJada Morgan is a 20 y.o. G1P0000 at 7978w6d  admitted for induction of labor due to Post dates. .  Subjective:  Comfortable with epidural  Objective: Vitals:   09/04/16 2301 09/04/16 2304 09/04/16 2305 09/04/16 2306  BP: (!) 197/171 (!) 171/158  (!) 163/121  Pulse: (!) 131 (!) 120 (!) 133 (!) 154  Resp:      Temp:      TempSrc:      SpO2: 99% 100% 100%   Weight:      Height:       No intake/output data recorded.  FHT:  FHR: 145 bpm, variability: moderate,  accelerations:  Present,  decelerations:  Absent UC:   regular, every 1-2 minutes SVE:   Dilation: 6.5 Effacement (%): 80 Station: 0 Exam by:: fran cresenzo  Foley out; AROM w/clear liquid  Labs: Lab Results  Component Value Date   WBC 7.1 09/04/2016   HGB 12.3 09/04/2016   HCT 35.3 (L) 09/04/2016   MCV 84.0 09/04/2016   PLT 279 09/04/2016    Assessment / Plan: IOL for postdates, progressing well  Labor: Progressing normally Fetal Wellbeing:  Category I Pain Control:  Epidural Anticipated MOD:  NSVD  CRESENZO-DISHMAN,Takeo Harts 09/04/2016, 11:15 PM

## 2016-09-05 ENCOUNTER — Encounter (HOSPITAL_COMMUNITY): Payer: Self-pay

## 2016-09-05 DIAGNOSIS — O99824 Streptococcus B carrier state complicating childbirth: Secondary | ICD-10-CM

## 2016-09-05 DIAGNOSIS — Z3A40 40 weeks gestation of pregnancy: Secondary | ICD-10-CM

## 2016-09-05 DIAGNOSIS — O480 Post-term pregnancy: Secondary | ICD-10-CM

## 2016-09-05 LAB — RAPID URINE DRUG SCREEN, HOSP PERFORMED
Amphetamines: NOT DETECTED
Barbiturates: NOT DETECTED
Benzodiazepines: NOT DETECTED
Cocaine: NOT DETECTED
Opiates: NOT DETECTED
Tetrahydrocannabinol: NOT DETECTED

## 2016-09-05 LAB — RPR: RPR Ser Ql: NONREACTIVE

## 2016-09-05 MED ORDER — INFLUENZA VAC SPLIT QUAD 0.5 ML IM SUSY
0.5000 mL | PREFILLED_SYRINGE | INTRAMUSCULAR | Status: AC
  Administered 2016-09-06: 0.5 mL via INTRAMUSCULAR

## 2016-09-05 MED ORDER — SIMETHICONE 80 MG PO CHEW: 80.0000 mg | CHEWABLE_TABLET | ORAL | Status: DC | PRN

## 2016-09-05 MED ORDER — DIPHENHYDRAMINE HCL 25 MG PO CAPS
25.0000 mg | ORAL_CAPSULE | Freq: Four times a day (QID) | ORAL | Status: DC | PRN
  Administered 2016-09-05: 25 mg via ORAL
  Filled 2016-09-05: qty 1

## 2016-09-05 MED ORDER — WITCH HAZEL-GLYCERIN EX PADS: 1.0000 "application " | MEDICATED_PAD | CUTANEOUS | Status: DC | PRN

## 2016-09-05 MED ORDER — ONDANSETRON HCL 4 MG PO TABS: 4.0000 mg | ORAL_TABLET | ORAL | Status: DC | PRN

## 2016-09-05 MED ORDER — IBUPROFEN 600 MG PO TABS
600.0000 mg | ORAL_TABLET | Freq: Four times a day (QID) | ORAL | Status: DC
  Administered 2016-09-05 – 2016-09-06 (×6): 600 mg via ORAL
  Filled 2016-09-05 (×6): qty 1

## 2016-09-05 MED ORDER — BISACODYL 10 MG RE SUPP: 10.0000 mg | Freq: Every day | RECTAL | Status: DC | PRN

## 2016-09-05 MED ORDER — TETANUS-DIPHTH-ACELL PERTUSSIS 5-2.5-18.5 LF-MCG/0.5 IM SUSP
0.5000 mL | Freq: Once | INTRAMUSCULAR | Status: AC
  Administered 2016-09-06: 0.5 mL via INTRAMUSCULAR
  Filled 2016-09-05: qty 0.5

## 2016-09-05 MED ORDER — BENZOCAINE-MENTHOL 20-0.5 % EX AERO
1.0000 "application " | INHALATION_SPRAY | CUTANEOUS | Status: DC | PRN
  Administered 2016-09-05: 1 via TOPICAL
  Filled 2016-09-05: qty 56

## 2016-09-05 MED ORDER — METHYLERGONOVINE MALEATE 0.2 MG PO TABS: 0.2000 mg | ORAL_TABLET | ORAL | Status: DC | PRN

## 2016-09-05 MED ORDER — FERROUS SULFATE 325 (65 FE) MG PO TABS
325.0000 mg | ORAL_TABLET | Freq: Two times a day (BID) | ORAL | Status: DC
  Administered 2016-09-05 – 2016-09-06 (×3): 325 mg via ORAL
  Filled 2016-09-05 (×3): qty 1

## 2016-09-05 MED ORDER — ONDANSETRON HCL 4 MG/2ML IJ SOLN: 4.0000 mg | INTRAMUSCULAR | Status: DC | PRN

## 2016-09-05 MED ORDER — DOCUSATE SODIUM 100 MG PO CAPS
100.0000 mg | ORAL_CAPSULE | Freq: Two times a day (BID) | ORAL | Status: DC
  Administered 2016-09-06 (×2): 100 mg via ORAL
  Filled 2016-09-05 (×3): qty 1

## 2016-09-05 MED ORDER — FLEET ENEMA 7-19 GM/118ML RE ENEM: 1.0000 | ENEMA | Freq: Every day | RECTAL | Status: DC | PRN

## 2016-09-05 MED ORDER — ZOLPIDEM TARTRATE 5 MG PO TABS: 5.0000 mg | ORAL_TABLET | Freq: Every evening | ORAL | Status: DC | PRN

## 2016-09-05 MED ORDER — ACETAMINOPHEN 325 MG PO TABS
650.0000 mg | ORAL_TABLET | ORAL | Status: DC | PRN
  Administered 2016-09-06: 650 mg via ORAL
  Filled 2016-09-05: qty 2

## 2016-09-05 MED ORDER — COCONUT OIL OIL: 1.0000 "application " | TOPICAL_OIL | Status: DC | PRN

## 2016-09-05 MED ORDER — MEASLES, MUMPS & RUBELLA VAC ~~LOC~~ INJ
0.5000 mL | INJECTION | Freq: Once | SUBCUTANEOUS | Status: DC
  Filled 2016-09-05: qty 0.5

## 2016-09-05 MED ORDER — DIBUCAINE 1 % RE OINT: 1.0000 "application " | TOPICAL_OINTMENT | RECTAL | Status: DC | PRN

## 2016-09-05 MED ORDER — PRENATAL MULTIVITAMIN CH
1.0000 | ORAL_TABLET | Freq: Every day | ORAL | Status: DC
  Administered 2016-09-05 – 2016-09-06 (×2): 1 via ORAL
  Filled 2016-09-05 (×2): qty 1

## 2016-09-05 MED ORDER — METHYLERGONOVINE MALEATE 0.2 MG/ML IJ SOLN: 0.2000 mg | INTRAMUSCULAR | Status: DC | PRN

## 2016-09-05 NOTE — Progress Notes (Signed)
UR chart review completed.  

## 2016-09-05 NOTE — Anesthesia Postprocedure Evaluation (Signed)
Anesthesia Post Note  Patient: Darlene Morgan  Procedure(s) Performed: * No procedures listed *  Patient location during evaluation: Mother Baby Anesthesia Type: Epidural Level of consciousness: awake Pain management: pain level controlled Vital Signs Assessment: post-procedure vital signs reviewed and stable Respiratory status: spontaneous breathing Cardiovascular status: stable Postop Assessment: no headache, no backache, epidural receding and patient able to bend at knees Anesthetic complications: no     Last Vitals:  Vitals:   09/05/16 0755 09/05/16 1137  BP: 118/77 119/77  Pulse: 92 83  Resp: 16 18  Temp: 37.1 C 36.5 C    Last Pain:  Vitals:   09/05/16 1137  TempSrc: Oral  PainSc: 0-No pain   Pain Goal:                 Edison PaceWILKERSON,Tiphani Mells

## 2016-09-05 NOTE — Plan of Care (Signed)
Problem: Activity: Goal: Ability to tolerate increased activity will improve Outcome: Completed/Met Date Met: 09/05/16 Pt ambulating easily and frequently in room.  Pt denies any problems with ambulation.   Problem: Urinary Elimination: Goal: Ability to reestablish a normal urinary elimination pattern will improve Outcome: Completed/Met Date Met: 09/05/16 Pt voided without a problem.  Pt reports some burning with urination which improved when urine was diluted with water from the peri bottle.  Peri care taught.

## 2016-09-05 NOTE — Lactation Note (Signed)
This note was copied from a baby's chart. Lactation Consultation Note  Patient Name: Darlene Jilda RocheJada Round ZOXWR'UToday's Date: 09/05/2016 Reason for consult: Initial assessment (baby is 610 Hours old, baby presently sleeping , mom aware to page with feeding cues ) Baby is 10 hours old and has been to the breast  x2  ( 15 and 40 mins ), Latch score - 8-7  and attempts. Voided X1 and 2 Stools.  LC discussed breast feeding basics - and when LC discussed the importance of hand expressing , mom was willing to have LC show Her how . LC instructed mom and she needed assist and then mom re - demo back for LC. LC encouraged mom to apply EBM to nipples .   [facility] and was receptive to teaching at consult. Mother informed of post-discharge support and given phone number to the lactation department, including services for phone call assistance;  out-patient appointments; and breastfeeding support group. List of other breastfeeding resources in the community given in the handout. Encouraged  mother to call for problems or concerns related to breastfeeding.   Maternal Data Has patient been taught Hand Expression?: Yes (small drops both breast / mom returned demo well ) Does the patient have breastfeeding experience prior to this delivery?: No  Feeding Feeding Type: Breast Fed Length of feed: 40 min (per mom , )  LATCH Score/Interventions                Intervention(s): Breastfeeding basics reviewed     Lactation Tools Discussed/Used WIC Program: Yes ([facility] , grandmother attended also )   Consult Status Consult Status: Follow-up Date: 09/05/16 Follow-up type: In-patient    Matilde SprangMargaret Ann Greenlee Ancheta 09/05/2016, 3:28 PM

## 2016-09-06 ENCOUNTER — Encounter (HOSPITAL_COMMUNITY): Payer: Self-pay

## 2016-09-06 DIAGNOSIS — O480 Post-term pregnancy: Secondary | ICD-10-CM

## 2016-09-06 DIAGNOSIS — O99824 Streptococcus B carrier state complicating childbirth: Secondary | ICD-10-CM

## 2016-09-06 DIAGNOSIS — Z3A40 40 weeks gestation of pregnancy: Secondary | ICD-10-CM

## 2016-09-06 MED ORDER — IBUPROFEN 600 MG PO TABS: 600.0000 mg | ORAL_TABLET | Freq: Four times a day (QID) | ORAL | 0 refills | Status: DC | PRN

## 2016-09-06 NOTE — Lactation Note (Signed)
This note was copied from a baby's chart. Lactation Consultation Note  Patient Name: Darlene Jilda RocheJada Meyerhoff VQQVZ'DToday's Date: 09/06/2016  Follow up visit made prior to discharge.  Baby just finished a feeding and is skin to skin on mom's chest.  Mom states baby latches easily and cluster feeding this AM.  Reviewed basics and discharge teaching including engorgement treatment.  Questions answered. Lactation outpatient services and support reviewed and encouraged.   Maternal Data    Feeding Feeding Type: Breast Fed  LATCH Score/Interventions                      Lactation Tools Discussed/Used     Consult Status      Huston FoleyMOULDEN, Desarie Feild S 09/06/2016, 11:49 AM

## 2016-09-06 NOTE — Clinical Social Work Maternal (Signed)
  CLINICAL SOCIAL WORK MATERNAL/CHILD NOTE  Patient Details  Name: Sharmayne Jablon MRN: [id number] Date of Birth: [birth year]  Date:  09/06/2016  Clinical Social Worker Initiating Note:  Ferdinand Lango Suzanne Kho, MSW, LCSW-A Date/ Time Initiated:  09/06/16/1008     Child's Name:  Otilio Miu Haven-Florence    Legal Guardian:  Other (Comment) (Not established by court; both MOB and FOB are parenting collectively )   Need for Interpreter:  None   Date of Referral:  09/05/16     Reason for Referral:  Current Substance Use/Substance Use During Pregnancy  (MOB (+) UDS for [facility] on 01/2016)   Referral Source:  [facility]   Address:  [place], [place] [address]  Phone number:  [phone]   Household Members:  Self, Parents (MOB and MOB's mother )   Natural Supports (not living in the home):  Parent, Spouse/significant other   Professional Supports: None   Employment: Part-time   Type of Work: [employer]    Education:  9 to 11 years   Museum/gallery curator Resources:  Medicaid   Other Resources:  [facility]   Cultural/Religious Considerations Which May Impact Care:  None reported at this time.   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 At this time, this writer explained role and reasoning for visit being due to her hx of THC use. MOB was pleasant and honest during assessment. FOB, Zsimon Florence (DOB 01/01/1995) was present at bedside. MOB confirms having a (+) UDS in 01/2016. She further notes she has not had any more use of substance after that. This Probation officer explained hospital policy and procedure. MOB verbalized understanding. This Probation officer informed MOB that babys UDS and cord blood test have been taken  and results are pending. At this time, no other needs were addressed or identified. CSW will continue to follow pending UDS and cord blood test results.    CSW Plan/Description:  Psychosocial Support and Ongoing Assessment of Needs    Water quality scientist, MSW, LCSW-A Clinical Social Worker  [facility]  Office: [phone]

## 2016-09-06 NOTE — Discharge Instructions (Signed)

## 2016-09-06 NOTE — Discharge Summary (Signed)
OB Discharge Summary     Patient Name: Darlene Morgan DOB: 07/20/1996 MRN: [id number]  Date of admission: 09/04/2016 Delivering MD: Jacklyn Shell   Date of discharge: 09/06/2016  Admitting diagnosis: 41wks, induction Intrauterine pregnancy: [redacted]w[redacted]d     Secondary diagnosis:  Active Problems:   Post term pregnancy at [redacted] weeks gestation   Encounter for trial of labor  Additional problems: none     Discharge diagnosis: Term Pregnancy Delivered                                                                                                Post partum procedures:none  Augmentation: AROM, Cytotec and Foley Balloon  Complications: None  Hospital course:  Induction of Labor With Vaginal Delivery   20 y.o. yo G1P1001 at [redacted]w[redacted]d was admitted to the hospital 09/04/2016 for induction of labor.  Indication for induction: Postdates.  Patient had an uncomplicated labor course as follows: Membrane Rupture Time/Date: 11:04 PM ,09/04/2016   Intrapartum Procedures: Episiotomy: None [1]                                         Lacerations:  None [1]  Patient had delivery of a Viable infant.  Information for the patient's newborn:  Kyarah, Enamorado Girl Annissa [[id number]]  Delivery Method: Vag-Spont   09/05/2016  Details of delivery can be found in separate delivery note.  Patient had a routine postpartum course. Patient is discharged home 09/06/16.   Physical exam Vitals:   09/05/16 1137 09/05/16 1704 09/05/16 1852 09/06/16 0545  BP: 119/77 116/80 130/80 100/72  Pulse: 83 79 71 92  Resp: 18 18 18 18   Temp: 97.7 F (36.5 C) 97.8 F (36.6 C) 97.8 F (36.6 C) 97.8 F (36.6 C)  TempSrc: Oral Oral Oral Oral  SpO2:   100%   Weight:      Height:       General: alert and cooperative Lochia: appropriate Uterine Fundus: firm Incision: N/A DVT Evaluation: No evidence of DVT seen on physical exam. Labs: Lab Results  Component Value Date   WBC 7.1 09/04/2016   HGB 12.3 09/04/2016   HCT 35.3  (L) 09/04/2016   MCV 84.0 09/04/2016   PLT 279 09/04/2016   CMP Latest Ref Rng & Units 09/04/2016  Glucose 65 - 99 mg/dL 75  BUN 6 - 20 mg/dL 9  Creatinine 7.82 - 9.56 mg/dL 2.13  Sodium 086 - 578 mmol/L 133(L)  Potassium 3.5 - 5.1 mmol/L 4.0  Chloride 101 - 111 mmol/L 104  CO2 22 - 32 mmol/L 21(L)  Calcium 8.9 - 10.3 mg/dL 9.4  Total Protein 6.5 - 8.1 g/dL 6.9  Total Bilirubin 0.3 - 1.2 mg/dL 0.4  Alkaline Phos 38 - 126 U/L 246(H)  AST 15 - 41 U/L 23  ALT 14 - 54 U/L 18    Discharge instruction: per After Visit Summary and "Baby and Me Booklet".  After visit meds:    Medication List    STOP taking these medications  acetaminophen 325 MG tablet Commonly known as:  TYLENOL   cyclobenzaprine 10 MG tablet Commonly known as:  FLEXERIL   omeprazole 20 MG capsule Commonly known as:  PRILOSEC     TAKE these medications   albuterol 108 (90 Base) MCG/ACT inhaler Commonly known as:  PROVENTIL HFA;VENTOLIN HFA Inhale 2 puffs into the lungs every 6 (six) hours as needed for wheezing or shortness of breath.   ibuprofen 600 MG tablet Commonly known as:  ADVIL,MOTRIN Take 1 tablet (600 mg total) by mouth every 6 (six) hours as needed.   PRIMACARE 30-1-470 MG Caps Take 1 capsule by mouth daily.       Diet: routine diet  Activity: Advance as tolerated. Pelvic rest for 6 weeks.   Outpatient follow up:6 weeks Follow up Appt:Future Appointments Date Time Provider Department Center  10/14/2016 10:30 AM Roe Coombsachelle A Denney, CNM [facility] None   Follow up Visit:No Follow-up on file.  Postpartum contraception: Depo Provera  Newborn Data: Live born female  Birth Weight: 7 lb 0.2 oz (3181 g) APGAR: 8, 9  Baby Feeding: Breast Disposition:home with mother   09/06/2016 Cam HaiSHAW, Ladavia Lindenbaum, CNM  9:34 AM

## 2016-10-14 ENCOUNTER — Ambulatory Visit: Payer: Self-pay | Admitting: Certified Nurse Midwife

## 2016-10-16 ENCOUNTER — Encounter: Payer: Self-pay | Admitting: Certified Nurse Midwife

## 2016-10-16 ENCOUNTER — Ambulatory Visit (INDEPENDENT_AMBULATORY_CARE_PROVIDER_SITE_OTHER): Payer: Medicaid Other | Admitting: Certified Nurse Midwife

## 2016-10-16 ENCOUNTER — Other Ambulatory Visit (HOSPITAL_COMMUNITY)
Admission: RE | Admit: 2016-10-16 | Discharge: 2016-10-16 | Disposition: A | Discharge status: Home / Self Care | Payer: Medicaid Other | Source: Ambulatory Visit | Attending: Certified Nurse Midwife | Admitting: Certified Nurse Midwife

## 2016-10-16 DIAGNOSIS — N760 Acute vaginitis: Secondary | ICD-10-CM

## 2016-10-16 DIAGNOSIS — B9689 Other specified bacterial agents as the cause of diseases classified elsewhere: Secondary | ICD-10-CM | POA: Diagnosis not present

## 2016-10-16 DIAGNOSIS — Z01419 Encounter for gynecological examination (general) (routine) without abnormal findings: Secondary | ICD-10-CM | POA: Diagnosis present

## 2016-10-16 DIAGNOSIS — Z30013 Encounter for initial prescription of injectable contraceptive: Secondary | ICD-10-CM

## 2016-10-16 MED ORDER — METRONIDAZOLE 500 MG PO TABS: 500.0000 mg | ORAL_TABLET | Freq: Two times a day (BID) | ORAL | 0 refills | Status: DC

## 2016-10-16 MED ORDER — MEDROXYPROGESTERONE ACETATE 150 MG/ML IM SUSP: 150.0000 mg | INTRAMUSCULAR | 4 refills | Status: DC

## 2016-10-16 NOTE — Progress Notes (Signed)
Subjective:     Darlene RocheJada Morgan is a 20 y.o. female who presents for a postpartum visit. She is 6 weeks postpartum following a spontaneous vaginal delivery. I have fully reviewed the prenatal and intrapartum course. The delivery was at 40.6 gestational weeks. Outcome: spontaneous vaginal delivery. Anesthesia: epidural. Postpartum course has been normal. Baby's course has been normal. Baby is feeding by breast. Bleeding staining only. Bowel function is normal. Bladder function is normal. Patient is not sexually active. Contraception method is abstinence. Postpartum depression screening: negative.  Tobacco, alcohol and substance abuse history reviewed.  Adult immunizations reviewed including TDAP, rubella and varicella.  The following portions of the patient's history were reviewed and updated as appropriate: allergies, current medications, past family history, past medical history, past social history, past surgical history and problem list.  Review of Systems Pertinent items noted in HPI and remainder of comprehensive ROS otherwise negative.   Objective:    BP 131/78   Pulse (!) 103   Temp 99.2 F (37.3 C)   Wt 137 lb (62.1 kg)   LMP 10/13/2016 (Approximate)   Breastfeeding? Yes   BMI 22.80 kg/m   General:  alert, cooperative and no distress   Breasts:  inspection negative, no nipple discharge or bleeding, no masses or nodularity palpable  Lungs: clear to auscultation bilaterally  Heart:  regular rate and rhythm, S1, S2 normal, no murmur, click, rub or gallop  Abdomen: soft, non-tender; bowel sounds normal; no masses,  no organomegaly   Vulva:  normal  Vagina: normal vagina  Cervix:  no bleeding following Pap  Corpus: normal size, contour, position, consistency, mobility, non-tender  Adnexa:  normal adnexa  Rectal Exam: Not performed.          50% of 30 min visit spent on counseling and coordination of care.   Assessment:     normal 6 week postpartum exam. Pap smear done at today's  visit.    contraception management  BV  Plan:    1. Contraception: abstinence, condoms and Depo-Provera injections 2.  F/u 1 year for annual exam, to bring Depo to office For injection.  3. Follow up in: 1 year or as needed.  2hr GTT for h/o GDM/screening for DM q 3 yrs per ADA recommendations Preconception counseling provided Healthy lifestyle practices reviewed

## 2016-10-19 LAB — NUSWAB VAGINITIS PLUS (VG+)
Atopobium vaginae: HIGH Score — AB
BVAB 2: HIGH Score — AB
Candida albicans, NAA: NEGATIVE
Candida glabrata, NAA: NEGATIVE
Chlamydia trachomatis, NAA: NEGATIVE
Megasphaera 1: HIGH Score — AB
Neisseria gonorrhoeae, NAA: NEGATIVE
Trich vag by NAA: NEGATIVE

## 2016-10-21 ENCOUNTER — Other Ambulatory Visit: Payer: Self-pay | Admitting: Certified Nurse Midwife

## 2016-10-21 LAB — CYTOLOGY - PAP: Diagnosis: NEGATIVE

## 2016-12-10 ENCOUNTER — Ambulatory Visit: Payer: Self-pay

## 2017-02-05 ENCOUNTER — Other Ambulatory Visit: Payer: Self-pay | Admitting: Certified Nurse Midwife

## 2017-02-05 ENCOUNTER — Ambulatory Visit (INDEPENDENT_AMBULATORY_CARE_PROVIDER_SITE_OTHER): Payer: Medicaid Other

## 2017-02-05 ENCOUNTER — Other Ambulatory Visit (HOSPITAL_COMMUNITY)
Admission: RE | Admit: 2017-02-05 | Discharge: 2017-02-05 | Disposition: A | Discharge status: Home / Self Care | Payer: BLUE CROSS/BLUE SHIELD | Source: Ambulatory Visit | Attending: Certified Nurse Midwife | Admitting: Certified Nurse Midwife

## 2017-02-05 VITALS — BP 119/78 | HR 82 | Wt 131.0 lb

## 2017-02-05 DIAGNOSIS — N760 Acute vaginitis: Secondary | ICD-10-CM

## 2017-02-05 DIAGNOSIS — Z113 Encounter for screening for infections with a predominantly sexual mode of transmission: Secondary | ICD-10-CM | POA: Diagnosis present

## 2017-02-05 DIAGNOSIS — B9689 Other specified bacterial agents as the cause of diseases classified elsewhere: Secondary | ICD-10-CM

## 2017-02-05 DIAGNOSIS — Z30013 Encounter for initial prescription of injectable contraceptive: Secondary | ICD-10-CM

## 2017-02-05 DIAGNOSIS — Z3202 Encounter for pregnancy test, result negative: Secondary | ICD-10-CM

## 2017-02-05 LAB — POCT URINE PREGNANCY: Preg Test, Ur: NEGATIVE

## 2017-02-05 MED ORDER — MEDROXYPROGESTERONE ACETATE 150 MG/ML IM SUSP
150.0000 mg | Freq: Once | INTRAMUSCULAR | Status: AC
  Administered 2017-02-05: 150 mg via INTRAMUSCULAR

## 2017-02-05 MED ORDER — METRONIDAZOLE 500 MG PO TABS: 500.0000 mg | ORAL_TABLET | Freq: Two times a day (BID) | ORAL | 0 refills | Status: DC

## 2017-02-05 NOTE — Addendum Note (Signed)
Addended by: Maretta BeesMCGLASHAN, Jozlin Bently J on: 02/05/2017 04:35 PM   Modules accepted: Orders

## 2017-02-05 NOTE — Progress Notes (Signed)
Patient presents for DEPO Injection. UPT-NEGATIVE.  Shot given in LyleLUOQ. She also requested STD Testing. Administrations This Visit    medroxyPROGESTERone (DEPO-PROVERA) injection 150 mg    Admin Date 02/05/2017 Action Given Dose 150 mg Route Intramuscular Administered By Maretta Beesarol J Kuulei Kleier, RMA

## 2017-02-09 LAB — CERVICOVAGINAL ANCILLARY ONLY
Chlamydia: NEGATIVE
Neisseria Gonorrhea: NEGATIVE
Trichomonas: NEGATIVE

## 2017-02-17 ENCOUNTER — Emergency Department (HOSPITAL_COMMUNITY)
Admission: EM | Admit: 2017-02-17 | Discharge: 2017-02-18 | Disposition: A | Discharge status: Home / Self Care | Payer: BLUE CROSS/BLUE SHIELD | Attending: Emergency Medicine | Admitting: Emergency Medicine

## 2017-02-17 ENCOUNTER — Encounter (HOSPITAL_COMMUNITY): Payer: Self-pay

## 2017-02-17 DIAGNOSIS — N898 Other specified noninflammatory disorders of vagina: Secondary | ICD-10-CM | POA: Diagnosis not present

## 2017-02-17 DIAGNOSIS — J45909 Unspecified asthma, uncomplicated: Secondary | ICD-10-CM | POA: Insufficient documentation

## 2017-02-17 DIAGNOSIS — Z87891 Personal history of nicotine dependence: Secondary | ICD-10-CM | POA: Diagnosis not present

## 2017-02-17 DIAGNOSIS — M25532 Pain in left wrist: Secondary | ICD-10-CM | POA: Diagnosis present

## 2017-02-17 DIAGNOSIS — M25539 Pain in unspecified wrist: Secondary | ICD-10-CM

## 2017-02-17 DIAGNOSIS — Z5321 Procedure and treatment not carried out due to patient leaving prior to being seen by health care provider: Secondary | ICD-10-CM | POA: Insufficient documentation

## 2017-02-17 LAB — URINALYSIS, ROUTINE W REFLEX MICROSCOPIC
Bilirubin Urine: NEGATIVE
Glucose, UA: NEGATIVE mg/dL
Hgb urine dipstick: NEGATIVE
Ketones, ur: NEGATIVE mg/dL
Leukocytes, UA: NEGATIVE
Nitrite: NEGATIVE
Protein, ur: NEGATIVE mg/dL
Specific Gravity, Urine: 1.012 (ref 1.005–1.030)
pH: 6 (ref 5.0–8.0)

## 2017-02-17 LAB — COMPREHENSIVE METABOLIC PANEL
ALT: 16 U/L (ref 14–54)
AST: 19 U/L (ref 15–41)
Albumin: 4.2 g/dL (ref 3.5–5.0)
Alkaline Phosphatase: 58 U/L (ref 38–126)
Anion gap: 9 (ref 5–15)
BUN: 9 mg/dL (ref 6–20)
CO2: 23 mmol/L (ref 22–32)
Calcium: 9.5 mg/dL (ref 8.9–10.3)
Chloride: 106 mmol/L (ref 101–111)
Creatinine, Ser: 0.89 mg/dL (ref 0.44–1.00)
GFR calc Af Amer: >60 mL/min (ref >60)
GFR calc non Af Amer: >60 mL/min (ref >60)
Glucose, Bld: 110 mg/dL — ABNORMAL HIGH (ref 65–99)
Potassium: 3.8 mmol/L (ref 3.5–5.1)
Sodium: 138 mmol/L (ref 135–145)
Total Bilirubin: 0.8 mg/dL (ref 0.3–1.2)
Total Protein: 7 g/dL (ref 6.5–8.1)

## 2017-02-17 LAB — CBC
HCT: 40.1 % (ref 36.0–46.0)
Hemoglobin: 13.5 g/dL (ref 12.0–15.0)
MCH: 29 pg (ref 26.0–34.0)
MCHC: 33.7 g/dL (ref 30.0–36.0)
MCV: 86.2 fL (ref 78.0–100.0)
Platelets: 322 10*3/uL (ref 150–400)
RBC: 4.65 MIL/uL (ref 3.87–5.11)
RDW: 14.3 % (ref 11.5–15.5)
WBC: 5.2 10*3/uL (ref 4.0–10.5)

## 2017-02-17 LAB — I-STAT BETA HCG BLOOD, ED (MC, WL, AP ONLY): I-stat hCG, quantitative: <5 m[IU]/mL (ref <5)

## 2017-02-17 LAB — LIPASE, BLOOD: Lipase: 14 U/L (ref 11–51)

## 2017-02-17 NOTE — ED Triage Notes (Signed)
Onset 1 1/2 months bilateral wrist pain.  Pt is wearing splints but pain is worsening L>R.  Onset 1 week vaginal discharge, yellow, thick, foul odor. No vaginal itching since pt used OTC Monistat.  c/o abdomin pain.

## 2017-02-17 NOTE — ED Provider Notes (Signed)
Patient called to room but staff was unable to locate. I did not interview or examine the patient.   Alona BeneJoshua Amahd Morino, MD   Maia PlanJoshua G Whittany Parish, MD 02/17/17 [phone]

## 2017-05-04 ENCOUNTER — Ambulatory Visit (INDEPENDENT_AMBULATORY_CARE_PROVIDER_SITE_OTHER): Payer: BLUE CROSS/BLUE SHIELD

## 2017-05-04 VITALS — BP 120/79 | HR 67 | Wt 131.7 lb

## 2017-05-04 DIAGNOSIS — Z3042 Encounter for surveillance of injectable contraceptive: Secondary | ICD-10-CM | POA: Diagnosis not present

## 2017-05-04 MED ORDER — MEDROXYPROGESTERONE ACETATE 150 MG/ML IM SUSP
150.0000 mg | Freq: Once | INTRAMUSCULAR | Status: AC
  Administered 2017-05-04: 150 mg via INTRAMUSCULAR

## 2017-05-04 NOTE — Progress Notes (Signed)
Patient presents for DEPO. Given in       Tolerated well.  Next DEPO 8/20-08/04/2017  Administrations This Visit    medroxyPROGESTERone (DEPO-PROVERA) injection 150 mg    Admin Date 05/04/2017 Action Given Dose 150 mg Route Intramuscular Administered By Maretta BeesMcGlashan, Carol J, RMA         .

## 2017-06-24 ENCOUNTER — Encounter (HOSPITAL_COMMUNITY): Payer: Self-pay

## 2017-06-24 ENCOUNTER — Emergency Department (HOSPITAL_COMMUNITY)
Admission: EM | Admit: 2017-06-24 | Discharge: 2017-06-24 | Disposition: A | Discharge status: Home / Self Care | Payer: BLUE CROSS/BLUE SHIELD | Attending: Emergency Medicine | Admitting: Emergency Medicine

## 2017-06-24 DIAGNOSIS — Z5321 Procedure and treatment not carried out due to patient leaving prior to being seen by health care provider: Secondary | ICD-10-CM | POA: Insufficient documentation

## 2017-06-24 DIAGNOSIS — Z113 Encounter for screening for infections with a predominantly sexual mode of transmission: Secondary | ICD-10-CM | POA: Diagnosis present

## 2017-06-24 NOTE — ED Notes (Signed)
Patient reporting that she is not going to be able to stay because she is supposed to be at the school picking daughter up right now. Offered to send urine sample to lab, pt declined. Pt ambulatory out of department.
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
## 2017-06-30 ENCOUNTER — Other Ambulatory Visit: Payer: Self-pay | Admitting: Certified Nurse Midwife

## 2017-06-30 ENCOUNTER — Telehealth: Payer: Self-pay

## 2017-06-30 DIAGNOSIS — N898 Other specified noninflammatory disorders of vagina: Secondary | ICD-10-CM

## 2017-06-30 MED ORDER — FLUCONAZOLE 150 MG PO TABS: 150.0000 mg | ORAL_TABLET | Freq: Once | ORAL | 3 refills | Status: AC

## 2017-06-30 NOTE — Telephone Encounter (Signed)
Pt called and left a message stating that she is having vaginal itching. Per protocol rx sent to pharmacy for diflucan. States that she does not have any odor.

## 2017-07-20 ENCOUNTER — Ambulatory Visit: Payer: BLUE CROSS/BLUE SHIELD
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
## 2017-11-25 ENCOUNTER — Telehealth (HOSPITAL_COMMUNITY): Payer: Self-pay | Admitting: Emergency Medicine

## 2017-11-25 ENCOUNTER — Encounter (HOSPITAL_COMMUNITY): Payer: Self-pay | Admitting: Emergency Medicine

## 2017-11-25 ENCOUNTER — Ambulatory Visit (HOSPITAL_COMMUNITY)
Admission: EM | Admit: 2017-11-25 | Discharge: 2017-11-25 | Disposition: A | Discharge status: Home / Self Care | Payer: BLUE CROSS/BLUE SHIELD | Attending: Family Medicine | Admitting: Family Medicine

## 2017-11-25 DIAGNOSIS — Z885 Allergy status to narcotic agent status: Secondary | ICD-10-CM | POA: Insufficient documentation

## 2017-11-25 DIAGNOSIS — Z8349 Family history of other endocrine, nutritional and metabolic diseases: Secondary | ICD-10-CM | POA: Diagnosis not present

## 2017-11-25 DIAGNOSIS — J029 Acute pharyngitis, unspecified: Secondary | ICD-10-CM

## 2017-11-25 DIAGNOSIS — J45909 Unspecified asthma, uncomplicated: Secondary | ICD-10-CM | POA: Insufficient documentation

## 2017-11-25 DIAGNOSIS — Z8249 Family history of ischemic heart disease and other diseases of the circulatory system: Secondary | ICD-10-CM | POA: Diagnosis not present

## 2017-11-25 DIAGNOSIS — R011 Cardiac murmur, unspecified: Secondary | ICD-10-CM | POA: Insufficient documentation

## 2017-11-25 DIAGNOSIS — Z113 Encounter for screening for infections with a predominantly sexual mode of transmission: Secondary | ICD-10-CM | POA: Diagnosis not present

## 2017-11-25 DIAGNOSIS — Z87891 Personal history of nicotine dependence: Secondary | ICD-10-CM | POA: Insufficient documentation

## 2017-11-25 LAB — POCT RAPID STREP A: Streptococcus, Group A Screen (Direct): NEGATIVE

## 2017-11-25 MED ORDER — CHLORHEXIDINE GLUCONATE 0.12 % MT SOLN: 15.0000 mL | Freq: Two times a day (BID) | OROMUCOSAL | 0 refills | Status: DC

## 2017-11-25 NOTE — ED Provider Notes (Signed)
[facility]   [id number] 11/25/17 Arrival Time: 1230   SUBJECTIVE:  Darlene Morgan is a 21 y.o. female who presents to the urgent care with complaint of sore throat associated w/fever, nasal congestion.... Reports she did not go to work due to illness.   Also wants to be checked for STI... Last had sex in July 2018 and did not use condoms.... sts she is asymptomatic.    Past Medical History:  Diagnosis Date  . Asthma   . Heart murmur    pt states small   . Supervision of normal first pregnancy, antepartum 01/25/2016    [facility]inic [facility] Prenatal Labs Dating LMP = 17 wk u/s Blood type: O/POS/-- (02/24 1537)  Genetic Screen 1 Screen:    AFP:     Quad:     NIPS: Antibody:NEG (02/24 1537) Anatomic US WNL female Rubella: 2.05 (02/24 1537) GTT Third trimester: 77/63/69 RPR: Non Reactive (07/05 0840)  Flu vaccine  HBsAg: NEGATIVE (02/24 1537)  TDaP vaccine                                               HIV: Non Reactive (07/05 0840)  Baby Food               Breast                                GBS: (For PCN allergy, check sensitivities) Contraception  Depo injections Pap: Circumcision NA  Pediatrician  [facility] [place]  Support Person       Family History  Problem Relation Age of Onset  . Hypertension Other   . Hypertension Maternal Grandmother   . Thyroid disease Maternal Grandmother    Social History   Socioeconomic History  . Marital status: Single    Spouse name: Not on file  . Number of children: Not on file  . Years of education: Not on file  . Highest education level: Not on file  Social Needs  . Financial resource strain: Not on file  . Food insecurity - worry: Not on file  . Food insecurity - inability: Not on file  . Transportation needs - medical: Not on file  . Transportation needs - non-medical: Not on file  Occupational History  . Not on file  Tobacco Use  . Smoking status: Former Smoker    Types: Cigarettes  . Smokeless tobacco: Never Used  Substance  and Sexual Activity  . Alcohol use: No    Alcohol/week: 0.0 oz  . Drug use: Yes    Frequency: 1.0 times per week    Types: Marijuana    Comment: last used 01/2016  . Sexual activity: Not on file  Other Topics Concern  . Not on file  Social History Narrative  . Not on file   No outpatient medications have been marked as taking for the 11/25/17 encounter [facility](Hospital Encounter).   Allergies  Allergen Reactions  . Hydrocodone Itching      ROS: As per HPI, remainder of ROS negative.   OBJECTIVE:   Vitals:   11/25/17 1325  BP: 120/74  Pulse: (!) 108  Resp: 16  Temp: 98.1 F (36.7 C)  TempSrc: Oral  SpO2: 100%     General appearance: alert; no distress Eyes: PERRL; EOMI; conjunctiva normal HENT: normocephalic; atraumatic;  TMs normal, canal normal, external ears normal without trauma; nasal mucosa normal; oral mucosa normal Neck: supple Lungs: clear to auscultation bilaterally Heart: regular rate and rhythm Abdomen: soft, non-tender; bowel sounds normal; no masses or organomegaly; no guarding or rebound tenderness Back: no CVA tenderness Extremities: no cyanosis or edema; symmetrical with no gross deformities Skin: warm and dry Neurologic: normal gait; grossly normal Psychological: alert and cooperative; normal mood and affect      Labs:  Results for orders placed or performed during the hospital encounter of 11/25/17  POCT rapid strep A [facility])  Result Value Ref Range   Streptococcus, Group A Screen (Direct) NEGATIVE NEGATIVE    Labs Reviewed  CULTURE, GROUP A STREP ([facility])  HIV ANTIBODY (ROUTINE TESTING)  POCT RAPID STREP A  URINE CYTOLOGY ANCILLARY ONLY  URINE CYTOLOGY ANCILLARY ONLY    No results found.     ASSESSMENT & PLAN:  1. Pharyngitis, unspecified etiology     Meds ordered this encounter  Medications  . chlorhexidine (PERIDEX) 0.12 % solution    Sig: Use as directed 15 mLs in the mouth or throat 2 (two) times daily.     Dispense:  120 mL    Refill:  0    Reviewed expectations re: course of current medical issues. Questions answered. Outlined signs and symptoms indicating need for more acute intervention. Patient verbalized understanding. After Visit Summary given.    Procedures:      Elvina SidleLauenstein, Sayan Aldava, MD 11/25/17 1434

## 2017-11-25 NOTE — ED Triage Notes (Signed)
PT C/O: sore throat associated w/fever, nasal congestion.... Reports she did not go to work due to illness.   Also wants to be checked for STI... Last had sex in July 2018 and did not use condoms.... sts she is asymptomatic.   ONSET: 2   TAKING MEDS: none   A&O x4... NAD... Ambulatory

## 2017-11-25 NOTE — Discharge Instructions (Signed)
Strep test is negative.  It appears of the viral sore throat.  STD tests are pending, you can find the results online in 24 hours

## 2017-11-26 LAB — URINE CYTOLOGY ANCILLARY ONLY
Chlamydia: NEGATIVE
Neisseria Gonorrhea: NEGATIVE
Trichomonas: NEGATIVE

## 2017-11-26 LAB — HIV ANTIBODY (ROUTINE TESTING W REFLEX): HIV Screen 4th Generation wRfx: NONREACTIVE

## 2017-11-28 LAB — CULTURE, GROUP A STREP (THRC)

## 2017-12-02 LAB — URINE CYTOLOGY ANCILLARY ONLY
Bacterial vaginitis: NEGATIVE
Candida vaginitis: NEGATIVE

## 2018-01-07 ENCOUNTER — Ambulatory Visit (INDEPENDENT_AMBULATORY_CARE_PROVIDER_SITE_OTHER): Payer: BLUE CROSS/BLUE SHIELD | Admitting: Obstetrics

## 2018-01-07 ENCOUNTER — Encounter: Payer: Self-pay | Admitting: Obstetrics

## 2018-01-07 VITALS — BP 111/74 | HR 94 | Temp 98.6°F | Resp 18 | Wt 132.0 lb

## 2018-01-07 DIAGNOSIS — Z3009 Encounter for other general counseling and advice on contraception: Secondary | ICD-10-CM | POA: Diagnosis not present

## 2018-01-07 DIAGNOSIS — Z3040 Encounter for surveillance of contraceptives, unspecified: Secondary | ICD-10-CM

## 2018-01-07 DIAGNOSIS — D508 Other iron deficiency anemias: Secondary | ICD-10-CM

## 2018-01-07 DIAGNOSIS — Z3202 Encounter for pregnancy test, result negative: Secondary | ICD-10-CM

## 2018-01-07 LAB — POCT URINE PREGNANCY: Preg Test, Ur: NEGATIVE

## 2018-01-07 MED ORDER — NORETHINDRONE-ETH ESTRADIOL 1-35 MG-MCG PO TABS: 1.0000 | ORAL_TABLET | Freq: Every day | ORAL | 11 refills | Status: DC

## 2018-01-07 MED ORDER — PRENATAL PLUS IRON 29-1 MG PO TABS: 1.0000 | ORAL_TABLET | Freq: Every day | ORAL | 11 refills | Status: DC

## 2018-01-07 NOTE — Progress Notes (Signed)
Subjective:    Darlene Morgan is a 22 y.o. female who presents for contraception counseling. The patient has no complaints today. The patient is not sexually active. Pertinent past medical history: none.  She is concerned that she cannot gain weight.  Wants a contraceptive that will help her gain weight.  The information documented in the HPI was reviewed and verified.  Menstrual History: OB History    Gravida Para Term Preterm AB Living   1 1 1  0 0 1   SAB TAB Ectopic Multiple Live Births   0 0 0 0 1       Patient's last menstrual period was 12/07/2017 (approximate).   Patient Active Problem List   Diagnosis Date Noted  . Post term pregnancy at [redacted] weeks gestation 09/04/2016  . Encounter for trial of labor 09/04/2016  . Supervision of normal first pregnancy, antepartum 01/25/2016   Past Medical History:  Diagnosis Date  . Asthma   . Heart murmur    pt states small   . Supervision of normal first pregnancy, antepartum 01/25/2016    [facility] Prenatal Labs Dating LMP = 17 wk u/s Blood type: O/POS/-- (02/24 1537)  Genetic Screen 1 Screen:    AFP:     Quad:     NIPS: Antibody:NEG (02/24 1537) Anatomic US WNL female Rubella: 2.05 (02/24 1537) GTT Third trimester: 77/63/69 RPR: Non Reactive (07/05 0840)  Flu vaccine  HBsAg: NEGATIVE (02/24 1537)  TDaP vaccine                                               HIV: Non Reactive (07/05 0840)  Baby Food               Breast                                GBS: (For PCN allergy, check sensitivities) Contraception  Depo injections Pap: Circumcision NA  Pediatrician  [facility] [place]  Support Person        Past Surgical History:  Procedure Laterality Date  . TONSILLECTOMY    . TONSILLECTOMY       Current Outpatient Medications:  .  albuterol (PROVENTIL HFA;VENTOLIN HFA) 108 (90 Base) MCG/ACT inhaler, Inhale 2 puffs into the lungs every 6 (six) hours as needed for wheezing or shortness of breath., Disp: , Rfl:  .  chlorhexidine (PERIDEX)  0.12 % solution, Use as directed 15 mLs in the mouth or throat 2 (two) times daily. (Patient not taking: Reported on 01/07/2018), Disp: 120 mL, Rfl: 0 .  medroxyPROGESTERone (DEPO-PROVERA) 150 MG/ML injection, Inject 1 mL (150 mg total) into the muscle every 3 (three) months. (Patient not taking: Reported on 01/07/2018), Disp: 1 mL, Rfl: 4 .  norethindrone-ethinyl estradiol 1/35 (ORTHO-NOVUM 1/35, 28,) tablet, Take 1 tablet by mouth daily., Disp: 1 Package, Rfl: 11 .  Pren-Fe-Meth-FA-Omeg w/o A (PRIMACARE) 30-1-470 MG CAPS, Take 1 capsule by mouth daily., Disp: , Rfl:  .  Prenatal Vit-Iron Carbonyl-FA (PRENATAL PLUS IRON) 29-1 MG TABS, Take 1 tablet by mouth daily before breakfast., Disp: 30 tablet, Rfl: 11 Allergies  Allergen Reactions  . Hydrocodone Itching    Social History   Tobacco Use  . Smoking status: Former Smoker    Types: Cigarettes  . Smokeless tobacco: Never Used  Substance Use Topics  . Alcohol use: No    Alcohol/week: 0.0 oz    Family History  Problem Relation Age of Onset  . Hypertension Other   . Hypertension Maternal Grandmother   . Thyroid disease Maternal Grandmother        Review of Systems Constitutional: negative for weight loss Genitourinary:negative for abnormal menstrual periods and vaginal discharge   Objective:   BP 111/74 (BP Location: Right Arm, Patient Position: Sitting, Cuff Size: Large)   Pulse 94   Temp 98.6 F (37 C) (Oral)   Resp 18   Wt 132 lb (59.9 kg)   LMP 12/07/2017 (Approximate)   Breastfeeding? No   BMI 21.97 kg/m   PE:  Deferred   Lab Review Urine pregnancy test Labs reviewed yes Radiologic studies reviewed no  50% of 10 min visit spent on counseling and coordination of care.    Assessment and Plan:    22 y.o., discontinuing Depo-Provera injections and starting OCP's, no contraindications.    1. Encounter for surveillance of contraceptives, unspecified contraceptive Rx: - POCT urine pregnancy  2. Iron deficiency  anemia secondary to inadequate dietary iron intake Rx: - TSH - CBC - Ferritin - Transferrin - Prenatal Vit-Iron Carbonyl-FA (PRENATAL PLUS IRON) 29-1 MG TABS; Take 1 tablet by mouth daily before breakfast.  Dispense: 30 tablet; Refill: 11   Plan:    All questions answered. Discussed healthy lifestyle modifications. Follow up in 4 months. Pregnancy test, result: negative.    Meds ordered this encounter  Medications  . norethindrone-ethinyl estradiol 1/35 (ORTHO-NOVUM 1/35, 28,) tablet    Sig: Take 1 tablet by mouth daily.    Dispense:  1 Package    Refill:  11  . Prenatal Vit-Iron Carbonyl-FA (PRENATAL PLUS IRON) 29-1 MG TABS    Sig: Take 1 tablet by mouth daily before breakfast.    Dispense:  30 tablet    Refill:  11   Orders Placed This Encounter  Procedures  . TSH  . CBC  . Ferritin  . Transferrin  . POCT urine pregnancy    Brock BadHARLES A. HARPER MD

## 2018-01-08 ENCOUNTER — Other Ambulatory Visit: Payer: Self-pay | Admitting: *Deleted

## 2018-01-08 ENCOUNTER — Other Ambulatory Visit: Payer: Self-pay | Admitting: Obstetrics

## 2018-01-08 ENCOUNTER — Other Ambulatory Visit: Payer: BLUE CROSS/BLUE SHIELD

## 2018-01-08 DIAGNOSIS — R7989 Other specified abnormal findings of blood chemistry: Secondary | ICD-10-CM

## 2018-01-08 DIAGNOSIS — R946 Abnormal results of thyroid function studies: Secondary | ICD-10-CM

## 2018-01-08 DIAGNOSIS — E639 Nutritional deficiency, unspecified: Secondary | ICD-10-CM

## 2018-01-08 LAB — CBC
Hematocrit: 38.2 % (ref 34.0–46.6)
Hemoglobin: 12.7 g/dL (ref 11.1–15.9)
MCH: 30 pg (ref 26.6–33.0)
MCHC: 33.2 g/dL (ref 31.5–35.7)
MCV: 90 fL (ref 79–97)
Platelets: 345 10*3/uL (ref 150–379)
RBC: 4.24 x10E6/uL (ref 3.77–5.28)
RDW: 14.4 % (ref 12.3–15.4)
WBC: 5.7 10*3/uL (ref 3.4–10.8)

## 2018-01-08 LAB — TSH: TSH: 0.402 u[IU]/mL — ABNORMAL LOW (ref 0.450–4.500)

## 2018-01-08 LAB — TRANSFERRIN: Transferrin: 303 mg/dL (ref 200–370)

## 2018-01-08 LAB — FERRITIN: Ferritin: 37 ng/mL (ref 15–150)

## 2018-01-09 LAB — THYROID PANEL
Free Thyroxine Index: 1.6 (ref 1.2–4.9)
T3 Uptake Ratio: 27 % (ref 24–39)
T4, Total: 5.8 ug/dL (ref 4.5–12.0)

## 2018-01-12 ENCOUNTER — Telehealth: Payer: Self-pay | Admitting: *Deleted

## 2018-01-12 ENCOUNTER — Other Ambulatory Visit: Payer: Self-pay | Admitting: Obstetrics

## 2018-01-12 NOTE — Telephone Encounter (Signed)
Pt called in and wanted the prescription for antibiotic sent to pharmacy frr her patient stated she was told by doctor to call in, please advise.Marland Kitchen..Marland Kitchen

## 2018-01-14 ENCOUNTER — Other Ambulatory Visit: Payer: Self-pay | Admitting: Obstetrics

## 2018-01-14 DIAGNOSIS — B9689 Other specified bacterial agents as the cause of diseases classified elsewhere: Secondary | ICD-10-CM

## 2018-01-14 DIAGNOSIS — N760 Acute vaginitis: Principal | ICD-10-CM

## 2018-01-14 MED ORDER — TINIDAZOLE 500 MG PO TABS: 1000.0000 mg | ORAL_TABLET | Freq: Every day | ORAL | 2 refills | Status: DC

## 2018-01-15 ENCOUNTER — Telehealth: Payer: Self-pay

## 2018-01-15 NOTE — Telephone Encounter (Signed)
S/w patient and advised of rx sent by provider.

## 2018-02-08 ENCOUNTER — Encounter: Payer: Self-pay | Admitting: Endocrinology

## 2018-02-08 ENCOUNTER — Ambulatory Visit (INDEPENDENT_AMBULATORY_CARE_PROVIDER_SITE_OTHER): Payer: BLUE CROSS/BLUE SHIELD | Admitting: Endocrinology

## 2018-02-08 DIAGNOSIS — E0590 Thyrotoxicosis, unspecified without thyrotoxic crisis or storm: Secondary | ICD-10-CM | POA: Diagnosis not present

## 2018-02-08 NOTE — Progress Notes (Signed)
Subjective:    Patient ID: Darlene Morgan, female    DOB: Jun 24, 1996, 22 y.o.   MRN: [id number]  HPI Pt is referred by Dr Clearance Coots, for hyperthyroidism.  Pt reports she was dx'ed with hyperthyroidism in early 2019.  she has never been on therapy for this.  she has never had XRT to the anterior neck, or thyroid surgery.  she has never had thyroid imaging.  she does not consume kelp or any other non-prescribed thyroid medication.  she has never been on amiodarone.  Pt reports slight palpitations in the chest, and assoc weight loss.  Pt says she is at risk for another pregnancy.  Past Medical History:  Diagnosis Date  . Asthma   . Heart murmur    pt states small   . Supervision of normal first pregnancy, antepartum 01/25/2016    Clinic [facility] Prenatal Labs Dating LMP = 17 wk u/s Blood type: O/POS/-- (02/24 1537)  Genetic Screen 1 Screen:    AFP:     Quad:     NIPS: Antibody:NEG (02/24 1537) Anatomic US WNL female Rubella: 2.05 (02/24 1537) GTT Third trimester: 77/63/69 RPR: Non Reactive (07/05 0840)  Flu vaccine  HBsAg: NEGATIVE (02/24 1537)  TDaP vaccine                                               HIV: Non Reactive (07/05 0840)  Baby Food               Breast                                GBS: (For PCN allergy, check sensitivities) Contraception  Depo injections Pap: Circumcision NA  Pediatrician  [facility] [place]  Support Person        Past Surgical History:  Procedure Laterality Date  . TONSILLECTOMY    . TONSILLECTOMY      Social History   Socioeconomic History  . Marital status: Single    Spouse name: Not on file  . Number of children: Not on file  . Years of education: Not on file  . Highest education level: Not on file  Social Needs  . Financial resource strain: Not on file  . Food insecurity - worry: Not on file  . Food insecurity - inability: Not on file  . Transportation needs - medical: Not on file  . Transportation needs - non-medical: Not on file  Occupational  History  . Not on file  Tobacco Use  . Smoking status: Former Smoker    Types: Cigarettes  . Smokeless tobacco: Never Used  Substance and Sexual Activity  . Alcohol use: No    Alcohol/week: 0.0 oz  . Drug use: Yes    Frequency: 1.0 times per week    Types: Marijuana    Comment: last used 01/2016  . Sexual activity: Not on file  Other Topics Concern  . Not on file  Social History Narrative  . Not on file    Current Outpatient Medications on File Prior to Visit  Medication Sig Dispense Refill  . albuterol (PROVENTIL HFA;VENTOLIN HFA) 108 (90 Base) MCG/ACT inhaler Inhale 2 puffs into the lungs every 6 (six) hours as needed for wheezing or shortness of breath.    . Pren-Fe-Meth-FA-Omeg w/o A (PRIMACARE) 30-1-470 MG  CAPS Take 1 capsule by mouth daily.     No current facility-administered medications on file prior to visit.     Allergies  Allergen Reactions  . Hydrocodone Itching    Family History  Problem Relation Age of Onset  . Hypertension Other   . Hypertension Maternal Grandmother   . Thyroid disease Maternal Grandmother   . Thyroid disease Mother     BP 118/78 (BP Location: Left Arm, Patient Position: Sitting, Cuff Size: Normal)   Pulse 88   Wt 133 lb 3.2 oz (60.4 kg)   SpO2 98%   BMI 22.17 kg/m    Review of Systems denies fever, headache, hoarseness, visual loss, sob, diarrhea, polyuria, muscle weakness, edema, heat intolerance, tremor, easy bruising, and rhinorrhea.  She has anxiety and excessive diaphoresis.       Objective:   Physical Exam VS: see vs page GEN: no distress HEAD: head: no deformity eyes: no periorbital swelling, no proptosis external nose and ears are normal mouth: no lesion seen NECK: thyroid is slightly enlarged, with slightly irreg surface, but no palpable nodule CHEST WALL: no deformity LUNGS: clear to auscultation CV: reg rate and rhythm, no murmur ABD: abdomen is soft, nontender.  no hepatosplenomegaly.  not distended.  no  hernia MUSCULOSKELETAL: muscle bulk and strength are grossly normal.  no obvious joint swelling.  gait is normal and steady EXTEMITIES: no deformity.  no edema PULSES: no carotid bruit NEURO:  cn 2-12 grossly intact.   readily moves all 4's.  sensation is intact to touch on all 4's SKIN:  Normal texture and temperature.  No rash or suspicious lesion is visible.  Slight diaphoretic NODES:  None palpable at the neck PSYCH: alert, well-oriented.  Does not appear anxious nor depressed.   Lab Results  Component Value Date   TSH 0.402 (L) 01/07/2018   T4TOTAL 5.8 01/08/2018   I have reviewed outside records, and summarized: Pt was noted to have suppressed TSH, and referred here.  She was see for contraception counseling, and TSH was checked      Assessment & Plan:  Hyperthyroidism, new to me.  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  Even if normal on recheck today, she will develop overt hyperthyroidism with time.    Patient Instructions  Thyroid blood tests are requested for you today.  We'll let you know about the results. If it is overactive again, I will prescribe for you a pill, to slow the thyroid.  Please come back for a follow-up appointment in 1 month.

## 2018-02-08 NOTE — Patient Instructions (Signed)
Thyroid blood tests are requested for you today.  We'll let you know about the results. If it is overactive again, I will prescribe for you a pill, to slow the thyroid.  Please come back for a follow-up appointment in 1 month.

## 2018-02-09 ENCOUNTER — Telehealth: Payer: Self-pay | Admitting: Endocrinology

## 2018-02-09 LAB — T4, FREE: Free T4: 0.73 ng/dL (ref 0.60–1.60)

## 2018-02-09 LAB — TSH: TSH: 0.65 u[IU]/mL (ref 0.35–4.50)

## 2018-02-09 NOTE — Telephone Encounter (Signed)
Patient calling re: lab results-told by Dr Everardo AllEllison to call today so that she could find out the results and get medication. Please call patient at ph# [phone]

## 2018-02-09 NOTE — Telephone Encounter (Signed)
I see that these labs are resulted but not released. Patient is calling for results. Please advise?

## 2018-02-09 NOTE — Telephone Encounter (Signed)
Results were normal.  However, thyroid could go low, so please come back for a follow-up appointment in 2 months.

## 2018-02-10 NOTE — Telephone Encounter (Signed)
I spoke with patient & gave her lab results. I cancelled her one month f/u & scheduled it for two month f/u.

## 2018-02-10 NOTE — Telephone Encounter (Signed)
I tried to call patient but phone only rings with no VM to leave message.

## 2018-03-05 ENCOUNTER — Ambulatory Visit: Payer: Self-pay | Admitting: Endocrinology

## 2018-03-11 ENCOUNTER — Ambulatory Visit (HOSPITAL_COMMUNITY)
Admission: EM | Admit: 2018-03-11 | Discharge: 2018-03-11 | Disposition: A | Discharge status: Other Acute Inpatient Hospital | Payer: Medicaid Other

## 2018-04-05 ENCOUNTER — Ambulatory Visit (INDEPENDENT_AMBULATORY_CARE_PROVIDER_SITE_OTHER): Payer: BLUE CROSS/BLUE SHIELD | Admitting: Endocrinology

## 2018-04-05 ENCOUNTER — Encounter: Payer: Self-pay | Admitting: Endocrinology

## 2018-04-05 VITALS — BP 118/68 | HR 96 | Wt 134.2 lb

## 2018-04-05 DIAGNOSIS — E0590 Thyrotoxicosis, unspecified without thyrotoxic crisis or storm: Secondary | ICD-10-CM

## 2018-04-05 LAB — TSH: TSH: 0.77 u[IU]/mL (ref 0.35–4.50)

## 2018-04-05 LAB — T4, FREE: Free T4: 0.73 ng/dL (ref 0.60–1.60)

## 2018-04-05 NOTE — Patient Instructions (Signed)
Thyroid blood tests are requested for you today.  We'll let you know about the results.   If it is overactive again, I will prescribe for you a pill, to slow it down.  In view of your medical condition, you should avoid pregnancy until we have decided it is safe.   If it is normal, please come back for a follow-up appointment in 1 month.

## 2018-04-05 NOTE — Progress Notes (Signed)
Subjective:    Patient ID: Darlene Morgan, female    DOB: 1996-09-29, 22 y.o.   MRN: [id number]  HPI Pt returns for f/u of mild hyperthyroidism (dx'ed in early 2019; she has never been on therapy for this; she has never had thyroid imaging; pt says she is at risk for another pregnancy).  pt states she feels well in general, except for palpitations in the chest.   Past Medical History:  Diagnosis Date  . Asthma   . Heart murmur    pt states small   . Supervision of normal first pregnancy, antepartum 01/25/2016    [facility] Prenatal Labs Dating LMP = 17 wk u/s Blood type: O/POS/-- (02/24 1537)  Genetic Screen 1 Screen:    AFP:     Quad:     NIPS: Antibody:NEG (02/24 1537) Anatomic US WNL female Rubella: 2.05 (02/24 1537) GTT Third trimester: 77/63/69 RPR: Non Reactive (07/05 0840)  Flu vaccine  HBsAg: NEGATIVE (02/24 1537)  TDaP vaccine                                               HIV: Non Reactive (07/05 0840)  Baby Food               Breast                                GBS: (For PCN allergy, check sensitivities) Contraception  Depo injections Pap: Circumcision NA  Pediatrician  [facility] [place]  Support Person        Past Surgical History:  Procedure Laterality Date  . TONSILLECTOMY    . TONSILLECTOMY      Social History   Socioeconomic History  . Marital status: Single    Spouse name: Not on file  . Number of children: Not on file  . Years of education: Not on file  . Highest education level: Not on file  Occupational History  . Not on file  Social Needs  . Financial resource strain: Not on file  . Food insecurity:    Worry: Not on file    Inability: Not on file  . Transportation needs:    Medical: Not on file    Non-medical: Not on file  Tobacco Use  . Smoking status: Former Smoker    Types: Cigarettes  . Smokeless tobacco: Never Used  Substance and Sexual Activity  . Alcohol use: No    Alcohol/week: 0.0 oz  . Drug use: Yes    Frequency: 1.0 times per week      Types: Marijuana    Comment: last used 01/2016  . Sexual activity: Not on file  Lifestyle  . Physical activity:    Days per week: Not on file    Minutes per session: Not on file  . Stress: Not on file  Relationships  . Social connections:    Talks on phone: Not on file    Gets together: Not on file    Attends religious service: Not on file    Active member of club or organization: Not on file    Attends meetings of clubs or organizations: Not on file    Relationship status: Not on file  . Intimate partner violence:    Fear of current or ex partner: Not on file  Emotionally abused: Not on file    Physically abused: Not on file    Forced sexual activity: Not on file  Other Topics Concern  . Not on file  Social History Narrative  . Not on file    Current Outpatient Medications on File Prior to Visit  Medication Sig Dispense Refill  . albuterol (PROVENTIL HFA;VENTOLIN HFA) 108 (90 Base) MCG/ACT inhaler Inhale 2 puffs into the lungs every 6 (six) hours as needed for wheezing or shortness of breath.    . Pren-Fe-Meth-FA-Omeg w/o A (PRIMACARE) 30-1-470 MG CAPS Take 1 capsule by mouth daily.     No current facility-administered medications on file prior to visit.     Allergies  Allergen Reactions  . Hydrocodone Itching    Family History  Problem Relation Age of Onset  . Hypertension Other   . Hypertension Maternal Grandmother   . Thyroid disease Maternal Grandmother   . Thyroid disease Mother     BP 118/68   Pulse 96   Wt 134 lb 3.2 oz (60.9 kg)   SpO2 98%   BMI 22.33 kg/m   Review of Systems No weight change.     Objective:   Physical Exam VITAL SIGNS:  See vs page GENERAL: no distress NECK: thyroid is slightly enlarged, with irreg surface.      Assessment & Plan:  Mild hyperthyroidism: due for recheck Palpitations: even if today's TFT are mildly abnormal, we'll start rx.   Patient Instructions  Thyroid blood tests are requested for you today.   We'll let you know about the results.   If it is overactive again, I will prescribe for you a pill, to slow it down.  In view of your medical condition, you should avoid pregnancy until we have decided it is safe.   If it is normal, please come back for a follow-up appointment in 1 month.

## 2018-04-06 ENCOUNTER — Telehealth: Payer: Self-pay | Admitting: Endocrinology

## 2018-04-06 NOTE — Telephone Encounter (Signed)
I left detailed VM with patient to call back & move appointment to 4-6 months out.

## 2018-04-06 NOTE — Telephone Encounter (Signed)
Patient is calling to get her lab results. Please advise.

## 2018-05-04 ENCOUNTER — Ambulatory Visit: Payer: Self-pay | Admitting: Endocrinology

## 2018-06-18 ENCOUNTER — Inpatient Hospital Stay (HOSPITAL_COMMUNITY)
Admission: AD | Admit: 2018-06-18 | Discharge: 2018-06-18 | Discharge status: Against Medical Advice | Payer: BLUE CROSS/BLUE SHIELD | Source: Ambulatory Visit | Attending: Obstetrics & Gynecology | Admitting: Obstetrics & Gynecology

## 2018-06-18 ENCOUNTER — Encounter (HOSPITAL_COMMUNITY): Payer: Self-pay | Admitting: *Deleted

## 2018-06-18 ENCOUNTER — Other Ambulatory Visit: Payer: Self-pay

## 2018-06-18 DIAGNOSIS — Z5320 Procedure and treatment not carried out because of patient's decision for unspecified reasons: Secondary | ICD-10-CM

## 2018-06-18 DIAGNOSIS — Z3A01 Less than 8 weeks gestation of pregnancy: Secondary | ICD-10-CM | POA: Diagnosis not present

## 2018-06-18 DIAGNOSIS — Z5321 Procedure and treatment not carried out due to patient leaving prior to being seen by health care provider: Secondary | ICD-10-CM | POA: Insufficient documentation

## 2018-06-18 DIAGNOSIS — Z5329 Procedure and treatment not carried out because of patient's decision for other reasons: Secondary | ICD-10-CM

## 2018-06-18 DIAGNOSIS — R109 Unspecified abdominal pain: Secondary | ICD-10-CM | POA: Diagnosis present

## 2018-06-18 DIAGNOSIS — O26891 Other specified pregnancy related conditions, first trimester: Secondary | ICD-10-CM

## 2018-06-18 DIAGNOSIS — Z885 Allergy status to narcotic agent status: Secondary | ICD-10-CM | POA: Insufficient documentation

## 2018-06-18 DIAGNOSIS — Z87891 Personal history of nicotine dependence: Secondary | ICD-10-CM | POA: Diagnosis not present

## 2018-06-18 LAB — URINALYSIS, ROUTINE W REFLEX MICROSCOPIC
Bacteria, UA: NONE SEEN
Bilirubin Urine: NEGATIVE
Glucose, UA: NEGATIVE mg/dL
Hgb urine dipstick: NEGATIVE
Ketones, ur: NEGATIVE mg/dL
Nitrite: NEGATIVE
Protein, ur: NEGATIVE mg/dL
Specific Gravity, Urine: 1.009 (ref 1.005–1.030)
pH: 6 (ref 5.0–8.0)

## 2018-06-18 LAB — POCT PREGNANCY, URINE: Preg Test, Ur: POSITIVE — AB

## 2018-06-18 NOTE — MAU Provider Note (Signed)
Chief Complaint: Headache; Abdominal Pain; and Possible Pregnancy   First Provider Initiated Contact with Patient 06/18/18 1250     SUBJECTIVE HPI: Darlene Morgan is a 22 y.o. G2P1001 at Unknown who presents to Maternity Admissions reporting abdominal pain. Also has been having headaches but denies headache now. Denies n/v/d, dysuria, discharge, or vaginal bleeding.    Location: lower abdomen Quality: cramp like Severity: 6/10 on pain scale Duration: 1 week Timing: intermittent Modifying factors: worse with stretching & quick movements. Has not treated pain.  Associated signs and symptoms: none  Past Medical History:  Diagnosis Date  . Asthma   . Heart murmur    pt states small   . Supervision of normal first pregnancy, antepartum 01/25/2016    Clinic [facility] Prenatal Labs Dating LMP = 17 wk u/s Blood type: O/POS/-- (02/24 1537)  Genetic Screen 1 Screen:    AFP:     Quad:     NIPS: Antibody:NEG (02/24 1537) Anatomic US WNL female Rubella: 2.05 (02/24 1537) GTT Third trimester: 77/63/69 RPR: Non Reactive (07/05 0840)  Flu vaccine  HBsAg: NEGATIVE (02/24 1537)  TDaP vaccine                                               HIV: Non Reactive (07/05 0840)  Baby Food               Breast                                GBS: (For PCN allergy, check sensitivities) Contraception  Depo injections Pap: Circumcision NA  Pediatrician  [facility] [place]  Support Person       OB History  Gravida Para Term Preterm AB Living  2 1 1  0 0 1  SAB TAB Ectopic Multiple Live Births  0 0 0 0 1    # Outcome Date GA Lbr Len/2nd Weight Sex Delivery Anes PTL Lv  2 Current           1 Term 09/05/16 [redacted]w[redacted]d 08:54 / 01:20 7 lb 0.2 oz (3.181 kg) F Vag-Spont EPI  LIV   Past Surgical History:  Procedure Laterality Date  . TONSILLECTOMY    . TONSILLECTOMY     Social History   Socioeconomic History  . Marital status: Single    Spouse name: Not on file  . Number of children: Not on file  . Years of education: Not  on file  . Highest education level: Not on file  Occupational History  . Not on file  Social Needs  . Financial resource strain: Not on file  . Food insecurity:    Worry: Not on file    Inability: Not on file  . Transportation needs:    Medical: Not on file    Non-medical: Not on file  Tobacco Use  . Smoking status: Former Smoker    Types: Cigarettes  . Smokeless tobacco: Never Used  Substance and Sexual Activity  . Alcohol use: No    Alcohol/week: 0.0 oz  . Drug use: Yes    Frequency: 1.0 times per week    Types: Marijuana    Comment: last used 01/2016  . Sexual activity: Not on file  Lifestyle  . Physical activity:    Days per week: Not on file  Minutes per session: Not on file  . Stress: Not on file  Relationships  . Social connections:    Talks on phone: Not on file    Gets together: Not on file    Attends religious service: Not on file    Active member of club or organization: Not on file    Attends meetings of clubs or organizations: Not on file    Relationship status: Not on file  . Intimate partner violence:    Fear of current or ex partner: Not on file    Emotionally abused: Not on file    Physically abused: Not on file    Forced sexual activity: Not on file  Other Topics Concern  . Not on file  Social History Narrative  . Not on file   Family History  Problem Relation Age of Onset  . Hypertension Other   . Hypertension Maternal Grandmother   . Thyroid disease Maternal Grandmother   . Thyroid disease Mother    No current facility-administered medications on file prior to encounter.    Current Outpatient Medications on File Prior to Encounter  Medication Sig Dispense Refill  . albuterol (PROVENTIL HFA;VENTOLIN HFA) 108 (90 Base) MCG/ACT inhaler Inhale 2 puffs into the lungs every 6 (six) hours as needed for wheezing or shortness of breath.    . Pren-Fe-Meth-FA-Omeg w/o A (PRIMACARE) 30-1-470 MG CAPS Take 1 capsule by mouth daily.     Allergies   Allergen Reactions  . Hydrocodone Itching    I have reviewed patient's Past Medical Hx, Surgical Hx, Family Hx, Social Hx, medications and allergies.   Review of Systems  Constitutional: Negative.   Gastrointestinal: Positive for abdominal pain. Negative for diarrhea, nausea and vomiting.  Genitourinary: Negative.   Neurological: Negative for headaches.    OBJECTIVE Patient Vitals for the past 24 hrs:  BP Temp Temp src Pulse Resp SpO2 Weight  06/18/18 1208 112/71 (!) 97.4 F (36.3 C) Oral 90 18 100 % 136 lb 4 oz (61.8 kg)   Constitutional: Well-developed, well-nourished female in no acute distress.  Cardiovascular: normal rate Respiratory: normal effort and effort.  MS: Extremities nontender, no edema, normal ROM Neurologic: Alert and oriented x 4.    LAB RESULTS Results for orders placed or performed during the hospital encounter of 06/18/18 (from the past 24 hour(s))  Pregnancy, urine POC     Status: Abnormal   Collection Time: 06/18/18 12:19 PM  Result Value Ref Range   Preg Test, Ur POSITIVE (A) NEGATIVE    IMAGING No results found.  MAU COURSE Orders Placed This Encounter  Procedures  . Urinalysis, Routine w reflex microscopic  . Pregnancy, urine POC   No orders of the defined types were placed in this encounter.   MDM + UPT Discussed plan with patient. She states she doesn't want to wait for evaluation & would rather come back in a week. Informed patient that I can't exclude pregnancy complications (including ectopic pregnancy which could be fatal) without evaluating her. Pt signed out AMA.   ASSESSMENT 1. Abdominal pain during pregnancy in first trimester   2. Left against medical advice     PLAN Left AMA     [name], [place], NP 06/18/2018  12:51 PM

## 2018-06-18 NOTE — MAU Note (Signed)
Been having cramping in lower abd and headache for a wk.  +HPT yesterday. No headache right now.

## 2018-06-21 ENCOUNTER — Ambulatory Visit: Payer: Self-pay

## 2018-06-21 ENCOUNTER — Encounter: Payer: Self-pay | Admitting: Family Medicine

## 2018-06-21 ENCOUNTER — Ambulatory Visit (INDEPENDENT_AMBULATORY_CARE_PROVIDER_SITE_OTHER): Payer: BLUE CROSS/BLUE SHIELD | Admitting: *Deleted

## 2018-06-21 DIAGNOSIS — Z3200 Encounter for pregnancy test, result unknown: Secondary | ICD-10-CM

## 2018-06-21 DIAGNOSIS — Z3201 Encounter for pregnancy test, result positive: Secondary | ICD-10-CM | POA: Diagnosis not present

## 2018-06-21 LAB — POCT PREGNANCY, URINE: Preg Test, Ur: POSITIVE — AB

## 2018-06-21 NOTE — Progress Notes (Signed)
Pt informed of +UPT today.  LMP 05/15/18. EDD 02/19/19.  Medication reconciliation completed. Pt states she thinks she may be farther along in the pregnancy than 2870w2d because she feels different and her LMP was not normal. Pt will schedule New Ob appt @ [facility] office.

## 2018-06-21 NOTE — Progress Notes (Signed)
I have reviewed the chart and agree with nursing staff's documentation of this patient's encounter.  Heather Hogan, CNM 06/21/2018 4:44 PM    

## 2018-06-22 ENCOUNTER — Telehealth: Payer: Self-pay

## 2018-06-22 NOTE — Telephone Encounter (Signed)
Called and spoke with Ms. Darlene Morgan, advised her I was calling from [facility] to get her scheduled for a New OB appt, patient advised me that she no longer needed to make an appointment and she is scheduled for a TAB tomorrow. Advised her to call our office if she needed any GYN care in the future.

## 2018-07-14 ENCOUNTER — Ambulatory Visit: Payer: Self-pay | Admitting: Obstetrics

## 2018-07-16 ENCOUNTER — Inpatient Hospital Stay (HOSPITAL_COMMUNITY): Payer: BLUE CROSS/BLUE SHIELD

## 2018-07-16 ENCOUNTER — Encounter (HOSPITAL_COMMUNITY): Payer: Self-pay | Admitting: *Deleted

## 2018-07-16 ENCOUNTER — Inpatient Hospital Stay (HOSPITAL_COMMUNITY)
Admission: AD | Admit: 2018-07-16 | Discharge: 2018-07-16 | Disposition: A | Discharge status: Home / Self Care | Payer: BLUE CROSS/BLUE SHIELD | Source: Ambulatory Visit | Attending: Obstetrics and Gynecology | Admitting: Obstetrics and Gynecology

## 2018-07-16 ENCOUNTER — Other Ambulatory Visit: Payer: Self-pay

## 2018-07-16 DIAGNOSIS — O036 Delayed or excessive hemorrhage following complete or unspecified spontaneous abortion: Secondary | ICD-10-CM | POA: Insufficient documentation

## 2018-07-16 DIAGNOSIS — N939 Abnormal uterine and vaginal bleeding, unspecified: Secondary | ICD-10-CM | POA: Diagnosis not present

## 2018-07-16 DIAGNOSIS — R109 Unspecified abdominal pain: Secondary | ICD-10-CM

## 2018-07-16 DIAGNOSIS — R102 Pelvic and perineal pain: Secondary | ICD-10-CM | POA: Diagnosis present

## 2018-07-16 DIAGNOSIS — Z87891 Personal history of nicotine dependence: Secondary | ICD-10-CM | POA: Insufficient documentation

## 2018-07-16 DIAGNOSIS — Z885 Allergy status to narcotic agent status: Secondary | ICD-10-CM | POA: Insufficient documentation

## 2018-07-16 LAB — URINALYSIS, ROUTINE W REFLEX MICROSCOPIC
Bilirubin Urine: NEGATIVE
Glucose, UA: NEGATIVE mg/dL
Ketones, ur: 5 mg/dL — AB
Nitrite: NEGATIVE
Protein, ur: NEGATIVE mg/dL
RBC / HPF: >50 RBC/hpf — ABNORMAL HIGH (ref 0–5)
Specific Gravity, Urine: 1.01 (ref 1.005–1.030)
pH: 9 — ABNORMAL HIGH (ref 5.0–8.0)

## 2018-07-16 MED ORDER — IBUPROFEN 600 MG PO TABS: 600.0000 mg | ORAL_TABLET | Freq: Four times a day (QID) | ORAL | 1 refills | Status: DC | PRN

## 2018-07-16 MED ORDER — OXYCODONE-ACETAMINOPHEN 5-325 MG PO TABS
1.0000 | ORAL_TABLET | Freq: Four times a day (QID) | ORAL | Status: DC | PRN
  Administered 2018-07-16: 2 via ORAL
  Filled 2018-07-16: qty 2

## 2018-07-16 NOTE — MAU Note (Signed)
Pt presents to MAU post abortion from August the 1st. Pt states first intercourse last night after the procedure. Started having vaginal bleeding 3 days ago and started having severe abdominal pain this morning

## 2018-07-16 NOTE — Discharge Instructions (Signed)
Hormonal Contraception Information °Hormonal contraception is a type of birth control that uses hormones to prevent pregnancy. It usually involves a combination of the hormones estrogen and progesterone or only the hormone progesterone. Hormonal contraception works in these ways: °· It thickens the mucus in the cervix, making it harder for sperm to enter the uterus. °· It changes the lining of the uterus, making it harder for an egg to implant. °· It may stop the ovaries from releasing eggs (ovulation). Some women who take hormonal contraceptives that contain only progesterone may continue to ovulate. ° °Hormonal contraception cannot prevent sexually transmitted infections (STIs). Pregnancy may still occur. °Estrogen and progesterone contraceptives °Contraceptives that use a combination of estrogen and progesterone are available in these forms: °· Pill. Pills come in different combinations of hormones. They must be taken at the same time each day. Pills can affect your period, causing you to get your period once every three months or not at all. °· Patch. The patch must be worn on the lower abdomen for three weeks and then removed on the fourth. °· Vaginal ring. The ring is placed in the vagina and left there for three weeks. It is then removed for one week. ° °Progesterone contraceptives °Contraceptives that use progesterone only are available in these forms: °· Pill. Pills should be taken every day of the cycle. °· Intrauterine device (IUD). This device is inserted into the uterus and removed or replaced every five years or sooner. °· Implant. Plastic rods are placed under the skin of the upper arm. They are removed or replaced every three years or sooner. °· Injection. The injection is given once every 90 days. ° °What are the side effects? °The side effects of estrogen and progesterone contraceptives include: °· Nausea. °· Headaches. °· Breast tenderness. °· Bleeding or spotting between menstrual cycles. °· High  blood pressure (rare). °· Strokes, heart attacks, or blood clots (rare) ° °Side effects of progesterone-only contraceptives include: °· Nausea. °· Headaches. °· Breast tenderness. °· Unpredictable menstrual bleeding. °· High blood pressure (rare). ° °Talk to your health care provider about what side effects may affect you. °Where to find more information: °· Ask your health care provider for more information and resources about hormonal contraception. °· U.[place]. Department of Health and Human Services Office on Women's Health: www.womenshealth.gov °Questions to ask: °· What type of hormonal contraception is right for me? °· How long should I plan to use hormonal contraception? °· What are the side effects of the hormonal contraception method I choose? °· How can I prevent STIs while using hormonal contraception? °Contact a health care provider if: °· You start taking hormonal contraceptives and you develop persistent or severe side effects. °Summary °· Estrogen and progesterone are hormones used in many forms of birth control. °· Talk to your health care provider about what side effects may affect you. °· Hormonal contraception cannot prevent sexually transmitted infections (STIs). °· Ask your health care provider for more information and resources about hormonal contraception. °This information is not intended to replace advice given to you by your health care provider. Make sure you discuss any questions you have with your health care provider. °Document Released: 12/07/2007 Document Revised: 10/17/2016 Document Reviewed: 10/17/2016 °Elsevier Interactive Patient Education © 2018 Elsevier Inc. ° °

## 2018-07-16 NOTE — MAU Provider Note (Signed)
History     CSN: [id number]  Arrival date and time: 07/16/18 1307  CC: abdominal pain   Darlene Morgan is a 22 y.o. G2P1011 at 2 weeks status post therapeutic abortion at 7-[redacted] weeks GA presenting with pelvic pain and bright red vaginal bleeding.  She states she has had some degree of bleeding daily since the procedure except for 2 days last weekend. Started OCPs 2 days ago. She had more bright red bleeding last night after having intercourse for the first time since the procedure.  Saturated 1 pad today.  Denies passing any tissue or large clots. She has intermittent pelvic and suprapubic pain for 2 weeks which got worse after intercourse. Pain is sharp and throbbing and radiates to low back.  She has taken Celebrex today with no relief.  No aggravating factors.   Unaware of the results of her gonorrhea/ chlamydia culture done 2 weeks ago at the abortion clinic.  Has had no follow-up there because she has not been able to get in touch with them. Denies fever, chills, nausea, vomiting dysuria or flank pain.    OB History  Gravida Para Term Preterm AB Living  2 1 1  0 0 1  SAB TAB Ectopic Multiple Live Births  0 0 0 0 1    # Outcome Date GA Lbr Len/2nd Weight Sex Delivery Anes PTL Lv  2 Current           1 Term 09/05/16 [redacted]w[redacted]d 08:54 / 01:20 3181 g F Vag-Spont EPI  LIV     Past Medical History:  Diagnosis Date  . Asthma   . Heart murmur    pt states small   . Supervision of normal first pregnancy, antepartum 01/25/2016    Clinic [facility] Prenatal Labs Dating LMP = 17 wk u/s Blood type: O/POS/-- (02/24 1537)  Genetic Screen 1 Screen:    AFP:     Quad:     NIPS: Antibody:NEG (02/24 1537) Anatomic US WNL female Rubella: 2.05 (02/24 1537) GTT Third trimester: 77/63/69 RPR: Non Reactive (07/05 0840)  Flu vaccine  HBsAg: NEGATIVE (02/24 1537)  TDaP vaccine                                               HIV: Non Reactive (07/05 0840)  Baby Food               Breast                                GBS:  (For PCN allergy, check sensitivities) Contraception  Depo injections Pap: Circumcision NA  Pediatrician  [facility] [place]  Support Person        Past Surgical History:  Procedure Laterality Date  . TONSILLECTOMY    . TONSILLECTOMY      Family History  Problem Relation Age of Onset  . Hypertension Other   . Hypertension Maternal Grandmother   . Thyroid disease Maternal Grandmother   . Thyroid disease Mother     Social History   Tobacco Use  . Smoking status: Former Smoker    Types: Cigarettes  . Smokeless tobacco: Never Used  Substance Use Topics  . Alcohol use: No    Alcohol/week: 0.0 standard drinks  . Drug use: Yes    Frequency: 1.0 times per week  Types: Marijuana    Comment: last used 01/2016    Allergies:  Allergies  Allergen Reactions  . Hydrocodone Itching    Medications Prior to Admission  Medication Sig Dispense Refill Last Dose  . albuterol (PROVENTIL HFA;VENTOLIN HFA) 108 (90 Base) MCG/ACT inhaler Inhale 2 puffs into the lungs every 6 (six) hours as needed for wheezing or shortness of breath.   Not Taking  . Pren-Fe-Meth-FA-Omeg w/o A (PRIMACARE) 30-1-470 MG CAPS Take 1 capsule by mouth daily.   Not Taking    Review of Systems  Constitutional: Positive for fatigue. Negative for chills and fever.       Felt feverish earlier, no temp taken  HENT: Negative for congestion.   Gastrointestinal: Positive for abdominal pain. Negative for constipation, diarrhea, nausea, rectal pain and vomiting.  Genitourinary: Positive for pelvic pain, vaginal bleeding and vaginal pain. Negative for dysuria, flank pain, hematuria and urgency.  Neurological: Negative for dizziness.  Psychiatric/Behavioral: The patient is nervous/anxious.    Physical Exam   Blood pressure 124/69, pulse 90, temperature 98.4 F (36.9 C), resp. rate 16, height 5\' 6"  (1.676 m), weight 61.2 kg, last menstrual period 05/15/2018, SpO2 99 %, not currently breastfeeding.  Physical Exam   Constitutional: She is oriented to person, place, and time. She appears well-developed and well-nourished. She appears distressed.  Writhing n bed in apparent pain initially  HENT:  Head: Normocephalic.  Eyes: No scleral icterus.  Cardiovascular: Normal rate.  Respiratory: Effort normal.  GI: Soft. There is tenderness. There is no rebound and no guarding.  Moderate suprapubic tenderness  Genitourinary:  Genitourinary Comments: Speculum: dark red menstrual-like blood, no clots or tissue, Cx clean, multiparous Bimanual: cx closed, no CMT; uterus normal size, retroverted, somewhat tender, mobile; left adnexa with tenderness  Musculoskeletal: Normal range of motion.  Neurological: She is alert and oriented to person, place, and time.  Skin: Skin is warm and dry.  Psychiatric: She has a normal mood and affect. Her behavior is normal.    MAU Course  Procedures   GC/CT, urine culture sent Percocet 5mg  po given> pain resolved  Results for orders placed or performed during the hospital encounter of 07/16/18 (from the past 24 hour(s))  Urinalysis, Routine w reflex microscopic     Status: Abnormal   Collection Time: 07/16/18  1:20 PM  Result Value Ref Range   Color, Urine YELLOW YELLOW   APPearance HAZY (A) CLEAR   Specific Gravity, Urine 1.010 1.005 - 1.030   pH 9.0 (H) 5.0 - 8.0   Glucose, UA NEGATIVE NEGATIVE mg/dL   Hgb urine dipstick LARGE (A) NEGATIVE   Bilirubin Urine NEGATIVE NEGATIVE   Ketones, ur 5 (A) NEGATIVE mg/dL   Protein, ur NEGATIVE NEGATIVE mg/dL   Nitrite NEGATIVE NEGATIVE   Leukocytes, UA MODERATE (A) NEGATIVE   RBC / HPF >50 (H) 0 - 5 RBC/hpf   WBC, UA 21-50 0 - 5 WBC/hpf   Bacteria, UA RARE (A) NONE SEEN   Squamous Epithelial / LPF 0-5 0 - 5  Pregnancy, urine POC     Status: None   Collection Time: 07/16/18  1:25 PM  Result Value Ref Range   Preg Test, Ur NEGATIVE NEGATIVE   [place] Pelvis Transvanginal Non-ob (tv Only)  Result Date: 07/16/2018 CLINICAL DATA:   Two weeks post abortion. Rule out retained products of conception. Pain, vaginal bleeding. EXAM: TRANSABDOMINAL AND TRANSVAGINAL ULTRASOUND OF PELVIS TECHNIQUE: Both transabdominal and transvaginal ultrasound examinations of the pelvis were performed. Transabdominal technique was performed  for global imaging of the pelvis including uterus, ovaries, adnexal regions, and pelvic cul-de-sac. It was necessary to proceed with endovaginal exam following the transabdominal exam to visualize the uterus, endometrium, ovaries and adnexa. COMPARISON:  None FINDINGS: Uterus Measurements: 8.1 x 5.2 x 5.8 cm. No fibroids or other mass visualized. Endometrium Thickness: 3 mm in thickness.  No focal abnormality visualized. Right ovary Measurements: 3.3 x 2.4 x 2.4 cm. Normal appearance/no adnexal mass. Left ovary Measurements: 2.4 x 1.4 x 2.7 cm. Normal appearance/no adnexal mass. Other findings No abnormal free fluid. IMPRESSION: Normal endometrium.  No evidence of retained products of conception. Electronically Signed   By: Charlett NoseKevin  Dover M.D.   On: 07/16/2018 15:01   [place] Pelvis Complete  Result Date: 07/16/2018 CLINICAL DATA:  Two weeks post abortion. Rule out retained products of conception. Pain, vaginal bleeding. EXAM: TRANSABDOMINAL AND TRANSVAGINAL ULTRASOUND OF PELVIS TECHNIQUE: Both transabdominal and transvaginal ultrasound examinations of the pelvis were performed. Transabdominal technique was performed for global imaging of the pelvis including uterus, ovaries, adnexal regions, and pelvic cul-de-sac. It was necessary to proceed with endovaginal exam following the transabdominal exam to visualize the uterus, endometrium, ovaries and adnexa. COMPARISON:  None FINDINGS: Uterus Measurements: 8.1 x 5.2 x 5.8 cm. No fibroids or other mass visualized. Endometrium Thickness: 3 mm in thickness.  No focal abnormality visualized. Right ovary Measurements: 3.3 x 2.4 x 2.4 cm. Normal appearance/no adnexal mass. Left ovary  Measurements: 2.4 x 1.4 x 2.7 cm. Normal appearance/no adnexal mass. Other findings No abnormal free fluid. IMPRESSION: Normal endometrium.  No evidence of retained products of conception. Electronically Signed   By: Charlett NoseKevin  Dover M.D.   On: 07/16/2018 15:01   Advised acetaminophen up to 4 gm po/day alternating with ibuprofen as prescribed. Pelvic rest until cultures resulted and pain resolves. Advised to expect BTB with new start OCPs.   Assessment and Plan   1. Pelvic pain   2. Vagina bleeding    Allergies as of 07/16/2018      Reactions   Hydrocodone Itching      Medication List    STOP taking these medications   PRIMACARE 30-1-470 MG Caps     TAKE these medications   albuterol 108 (90 Base) MCG/ACT inhaler Commonly known as:  PROVENTIL HFA;VENTOLIN HFA Inhale 2 puffs into the lungs every 6 (six) hours as needed for wheezing or shortness of breath.   ibuprofen 600 MG tablet Commonly known as:  ADVIL,MOTRIN Take 1 tablet (600 mg total) by mouth every 6 (six) hours as needed.      Follow-up Information    [facility] Schedule an appointment as soon as possible for a visit in 6 month(s).   Why:  Family Planning clinic to follow up or call as needed with concerns Contact information: [address] [place] [id number] [phone]           Layan Zalenski CNM 07/16/2018, 3:05 PM

## 2018-07-19 ENCOUNTER — Ambulatory Visit: Payer: Self-pay | Admitting: Advanced Practice Midwife

## 2018-07-19 LAB — POCT PREGNANCY, URINE: Preg Test, Ur: POSITIVE — AB

## 2018-07-19 LAB — GC/CHLAMYDIA PROBE AMP (~~LOC~~) NOT AT ARMC
Chlamydia: POSITIVE — AB
Neisseria Gonorrhea: NEGATIVE

## 2018-07-21 ENCOUNTER — Telehealth: Payer: Self-pay | Admitting: Student

## 2018-07-21 DIAGNOSIS — A749 Chlamydial infection, unspecified: Secondary | ICD-10-CM

## 2018-07-21 MED ORDER — AZITHROMYCIN 500 MG PO TABS: 1000.0000 mg | ORAL_TABLET | Freq: Once | ORAL | 0 refills | Status: AC

## 2018-07-21 NOTE — Telephone Encounter (Addendum)
  Darlene Morgan tested positive for  Chlamydia. Patient was called by RN and allergies and pharmacy confirmed. Rx sent to pharmacy of choice.   [name], [name], NP 07/21/2018 9:19 AM       ----- Message from [name], RN sent at 07/21/2018  8:52 AM EDT ----- This patient tested positive for :  Chlamydia   She is allergic to "Hydrocodone",  I have informed the patient of her results and confirmed her pharmacy is correct in her chart. Please send Rx.   Thank you,   [name], [name], RN   [facility]

## 2018-08-16 ENCOUNTER — Encounter (HOSPITAL_COMMUNITY): Payer: Self-pay | Admitting: *Deleted

## 2018-08-16 ENCOUNTER — Emergency Department (HOSPITAL_COMMUNITY)
Admission: EM | Admit: 2018-08-16 | Discharge: 2018-08-16 | Disposition: A | Discharge status: Home / Self Care | Payer: BLUE CROSS/BLUE SHIELD | Attending: Emergency Medicine | Admitting: Emergency Medicine

## 2018-08-16 DIAGNOSIS — N898 Other specified noninflammatory disorders of vagina: Secondary | ICD-10-CM | POA: Diagnosis present

## 2018-08-16 DIAGNOSIS — B9689 Other specified bacterial agents as the cause of diseases classified elsewhere: Secondary | ICD-10-CM | POA: Diagnosis not present

## 2018-08-16 DIAGNOSIS — N760 Acute vaginitis: Secondary | ICD-10-CM | POA: Diagnosis not present

## 2018-08-16 LAB — WET PREP, GENITAL
Sperm: NONE SEEN
Trich, Wet Prep: NONE SEEN
Yeast Wet Prep HPF POC: NONE SEEN

## 2018-08-16 LAB — POC URINE PREG, ED: Preg Test, Ur: NEGATIVE

## 2018-08-16 MED ORDER — CEFTRIAXONE SODIUM 250 MG IJ SOLR
250.0000 mg | Freq: Once | INTRAMUSCULAR | Status: AC
  Administered 2018-08-16: 250 mg via INTRAMUSCULAR
  Filled 2018-08-16: qty 250

## 2018-08-16 MED ORDER — LIDOCAINE HCL (PF) 1 % IJ SOLN
INTRAMUSCULAR | Status: AC
  Administered 2018-08-16: 5 mL
  Filled 2018-08-16: qty 5

## 2018-08-16 MED ORDER — AZITHROMYCIN 250 MG PO TABS
1000.0000 mg | ORAL_TABLET | Freq: Once | ORAL | Status: AC
  Administered 2018-08-16: 1000 mg via ORAL
  Filled 2018-08-16: qty 4

## 2018-08-16 MED ORDER — METRONIDAZOLE 500 MG PO TABS: 500.0000 mg | ORAL_TABLET | Freq: Two times a day (BID) | ORAL | 0 refills | Status: DC

## 2018-08-16 NOTE — ED Provider Notes (Signed)
[facility] Provider Note   CSN: [id number] Arrival date & time: 08/16/18  [id number]     History   Chief Complaint Chief Complaint  Patient presents with  . Vaginal Discharge    HPI Darlene Morgan is a 22 y.o. female.  HPI   22 year old female presents today with complaints of vaginal discharge.  Patient notes purulent odorous vaginal discharge of the last week.  She denies any abdominal pain nausea vomiting or fever.  She reports she is sexually active with one female.  Patient notes recent D&C approximately 3 weeks ago.  Patient requesting prophylactic treatment.  No other complaints here.  Past Medical History:  Diagnosis Date  . Asthma   . Heart murmur    pt states small   . Supervision of normal first pregnancy, antepartum 01/25/2016    Clinic [facility] Prenatal Labs Dating LMP = 17 wk u/s Blood type: O/POS/-- (02/24 1537)  Genetic Screen 1 Screen:    AFP:     Quad:     NIPS: Antibody:NEG (02/24 1537) Anatomic US WNL female Rubella: 2.05 (02/24 1537) GTT Third trimester: 77/63/69 RPR: Non Reactive (07/05 0840)  Flu vaccine  HBsAg: NEGATIVE (02/24 1537)  TDaP vaccine                                               HIV: Non Reactive (07/05 0840)  Baby Food               Breast                                GBS: (For PCN allergy, check sensitivities) Contraception  Depo injections Pap: Circumcision NA  Pediatrician  [facility] [place]  Support Person        Patient Active Problem List   Diagnosis Date Noted  . Hyperthyroidism 02/08/2018  . Post term pregnancy at [redacted] weeks gestation 09/04/2016  . Encounter for trial of labor 09/04/2016  . Supervision of normal first pregnancy, antepartum 01/25/2016    Past Surgical History:  Procedure Laterality Date  . TONSILLECTOMY    . TONSILLECTOMY       OB History    Gravida  2   Para  1   Term  1   Preterm  0   AB  1   Living  1     SAB  0   TAB  1   Ectopic  0   Multiple  0   Live  Births  1            Home Medications    Prior to Admission medications   Medication Sig Start Date End Date Taking? Authorizing Provider  albuterol (PROVENTIL HFA;VENTOLIN HFA) 108 (90 Base) MCG/ACT inhaler Inhale 2 puffs into the lungs every 6 (six) hours as needed for wheezing or shortness of breath.    [provider]  ibuprofen (ADVIL,MOTRIN) 600 MG tablet Take 1 tablet (600 mg total) by mouth every 6 (six) hours as needed. 07/16/18   Poe, Deirdre C, CNM  metroNIDAZOLE (FLAGYL) 500 MG tablet Take 1 tablet (500 mg total) by mouth 2 (two) times daily. 08/16/18   Eyvonne MechanicHedges, Carina Chaplin, PA-C    Family History Family History  Problem Relation Age of Onset  . Hypertension Other   .  Hypertension Maternal Grandmother   . Thyroid disease Maternal Grandmother   . Thyroid disease Mother     Social History Social History   Tobacco Use  . Smoking status: Former Smoker    Types: Cigarettes  . Smokeless tobacco: Never Used  Substance Use Topics  . Alcohol use: No    Alcohol/week: 0.0 standard drinks  . Drug use: Yes    Frequency: 1.0 times per week    Types: Marijuana    Comment: last used 01/2016     Allergies   Hydrocodone   Review of Systems Review of Systems  All other systems reviewed and are negative.    Physical Exam Updated Vital Signs BP 118/73 (BP Location: Right Arm)   Pulse 84   Temp 98 F (36.7 C) (Oral)   Resp 20   SpO2 100%   Physical Exam  Constitutional: She is oriented to person, place, and time. She appears well-developed and well-nourished.  HENT:  Head: Normocephalic and atraumatic.  Eyes: Pupils are equal, round, and reactive to light. Conjunctivae are normal. Right eye exhibits no discharge. Left eye exhibits no discharge. No scleral icterus.  Neck: Normal range of motion. No JVD present. No tracheal deviation present.  Pulmonary/Chest: Effort normal. No stridor.  Abdominal: Soft. She exhibits no distension and no mass. There is no  tenderness. There is no rebound and no guarding. No hernia.  Genitourinary:  Genitourinary Comments: Small amount of white vaginal discharge noted in vaginal vault- no cervical motion tenderness   Neurological: She is alert and oriented to person, place, and time. Coordination normal.  Psychiatric: She has a normal mood and affect. Her behavior is normal. Judgment and thought content normal.  Nursing note and vitals reviewed.   ED Treatments / Results  Labs (all labs ordered are listed, but only abnormal results are displayed) Labs Reviewed  WET PREP, GENITAL - Abnormal; Notable for the following components:      Result Value   Clue Cells Wet Prep HPF POC PRESENT (*)    WBC, Wet Prep HPF POC FEW (*)    All other components within normal limits  POC URINE PREG, ED  GC/CHLAMYDIA PROBE AMP ([place]) NOT AT [facility]    EKG None  Radiology No results found.  Procedures Procedures (including critical care time)  Medications Ordered in ED Medications  cefTRIAXone (ROCEPHIN) injection 250 mg (250 mg Intramuscular Given 08/16/18 1248)  azithromycin (ZITHROMAX) tablet 1,000 mg (1,000 mg Oral Given 08/16/18 1248)  lidocaine (PF) (XYLOCAINE) 1 % injection (5 mLs  Given 08/16/18 1249)     Initial Impression / Assessment and Plan / ED Course  I have reviewed the triage vital signs and the nursing notes.  Pertinent labs & imaging results that were available during my care of the patient were reviewed by me and considered in my medical decision making (see chart for details).     Labs: wet prep, GC  Imaging:  Consults:   Therapeutics: ceftriaxone, azithromycin  Discharge Meds: Metronidazole  Assessment/Plan:   22 year old presents with vaginal discharge.  Clue cells noted on wet prep, treated bacterial vaginosis.  Prophylactically treated for gonorrhea and chlamydia no signs of PID discharged with outpatient follow-up and return precautions.  She verbalized understanding and  agreement to today's plan.    Final Clinical Impressions(s) / ED Diagnoses   Final diagnoses:  Vaginal discharge    ED Discharge Orders         Ordered    metroNIDAZOLE (FLAGYL) 500 MG  tablet  2 times daily     08/16/18 1331           Eyvonne Mechanic, PA-C 08/16/18 1338    Sabas Sous, MD 08/16/18 [id number]

## 2018-08-16 NOTE — ED Triage Notes (Signed)
Pt in stating she thinks she has chlamydia, known exposure and is now noting vaginal discharge and odor

## 2018-08-16 NOTE — Discharge Instructions (Addendum)
Please read attached information. If you experience any new or worsening signs or symptoms please return to the emergency room for evaluation. Please follow-up with your primary care provider or specialist as discussed. Please use medication prescribed only as directed and discontinue taking if you have any concerning signs or symptoms.   °

## 2018-08-16 NOTE — ED Notes (Signed)
Patient verbalizes understanding of discharge instructions. Opportunity for questioning and answers were provided. Armband removed by staff, pt discharged from ED ambulatory.   

## 2018-08-16 NOTE — ED Notes (Signed)
Patient ambulatory to bathroom with steady gait at this time to provide urine sample 

## 2018-08-17 LAB — GC/CHLAMYDIA PROBE AMP (~~LOC~~) NOT AT ARMC
Chlamydia: NEGATIVE
Neisseria Gonorrhea: NEGATIVE

## 2018-09-13 ENCOUNTER — Ambulatory Visit: Payer: BLUE CROSS/BLUE SHIELD | Admitting: Advanced Practice Midwife

## 2018-09-13 NOTE — Progress Notes (Deleted)
Subjective:     Darlene Morgan is a 22 y.o. female here for a routine exam.  Current complaints: ***.  Personal health questionnaire reviewed: {yes/no:9010}.   Gynecologic History No LMP recorded. Contraception: {method:5051} Last Pap: ***. Results were: {norm/abn:16337} Last mammogram: ***. Results were: {norm/abn:16337}  Obstetric History OB History  Gravida Para Term Preterm AB Living  2 1 1  0 1 1  SAB TAB Ectopic Multiple Live Births  0 1 0 0 1    # Outcome Date GA Lbr Len/2nd Weight Sex Delivery Anes PTL Lv  2 TAB 07/01/18 [redacted]w[redacted]d         1 Term 09/05/16 [redacted]w[redacted]d 08:54 / 01:20 3181 g F Vag-Spont EPI  LIV     {Common ambulatory SmartLinks:19316}  Review of Systems {ros; complete:30496}    Objective:   There were no vitals taken for this visit.  VS reviewed, nursing note reviewed,  Constitutional: well developed, well nourished, no distress HEENT: normocephalic CV: normal rate Pulm/chest wall: normal effort Breast Exam:  right breast normal without mass, skin or nipple changes or axillary nodes, left breast normal without mass, skin or nipple changes or axillary nodes Abdomen: soft Neuro: alert and oriented x 3 Skin: warm, dry Psych: affect normal Pelvic exam: Cervix pink, visually closed, without lesion, scant white creamy discharge, vaginal walls and external genitalia normal Bimanual exam: Cervix 0/long/high, firm, anterior, neg CMT, uterus nontender, nonenlarged, adnexa without tenderness, enlargement, or mass  Assessment/Plan:   There are no diagnoses linked to this encounter.

## 2018-12-29 ENCOUNTER — Emergency Department (HOSPITAL_COMMUNITY)
Admission: EM | Admit: 2018-12-29 | Discharge: 2018-12-29 | Disposition: A | Discharge status: Home / Self Care | Payer: Medicaid Other | Attending: Emergency Medicine | Admitting: Emergency Medicine

## 2018-12-29 ENCOUNTER — Other Ambulatory Visit: Payer: Self-pay

## 2018-12-29 DIAGNOSIS — Z202 Contact with and (suspected) exposure to infections with a predominantly sexual mode of transmission: Secondary | ICD-10-CM | POA: Diagnosis present

## 2018-12-29 DIAGNOSIS — Z79899 Other long term (current) drug therapy: Secondary | ICD-10-CM | POA: Diagnosis not present

## 2018-12-29 DIAGNOSIS — N760 Acute vaginitis: Secondary | ICD-10-CM | POA: Diagnosis not present

## 2018-12-29 DIAGNOSIS — J45909 Unspecified asthma, uncomplicated: Secondary | ICD-10-CM | POA: Insufficient documentation

## 2018-12-29 DIAGNOSIS — E0590 Thyrotoxicosis, unspecified without thyrotoxic crisis or storm: Secondary | ICD-10-CM | POA: Insufficient documentation

## 2018-12-29 DIAGNOSIS — Z87891 Personal history of nicotine dependence: Secondary | ICD-10-CM | POA: Diagnosis not present

## 2018-12-29 LAB — WET PREP, GENITAL
Sperm: NONE SEEN
Trich, Wet Prep: NONE SEEN
Yeast Wet Prep HPF POC: NONE SEEN

## 2018-12-29 MED ORDER — CEFTRIAXONE SODIUM 250 MG IJ SOLR
250.0000 mg | Freq: Once | INTRAMUSCULAR | Status: AC
  Administered 2018-12-29: 250 mg via INTRAMUSCULAR
  Filled 2018-12-29: qty 250

## 2018-12-29 MED ORDER — AZITHROMYCIN 250 MG PO TABS
1000.0000 mg | ORAL_TABLET | Freq: Once | ORAL | Status: AC
  Administered 2018-12-29: 1000 mg via ORAL
  Filled 2018-12-29: qty 4

## 2018-12-29 MED ORDER — LIDOCAINE HCL (PF) 1 % IJ SOLN
INTRAMUSCULAR | Status: AC
  Administered 2018-12-29: 0.9 mL
  Filled 2018-12-29: qty 5

## 2018-12-29 MED ORDER — METRONIDAZOLE 500 MG PO TABS: 500.0000 mg | ORAL_TABLET | Freq: Two times a day (BID) | ORAL | 0 refills | Status: DC

## 2018-12-29 NOTE — ED Triage Notes (Signed)
Pt presents to ED for evaluation of abdominal pain and vaginal discharge x 3 days after having unprotected sex. Denies abdominal pain at triage. Wants to be tested for all STDs.

## 2018-12-29 NOTE — ED Provider Notes (Signed)
[facility] Provider Note   CSN: [id number] Arrival date & time: 12/29/18  1133     History   Chief Complaint No chief complaint on file.   HPI Darlene RocheJada Morgan is a 23 y.o. female.  The history is provided by the patient. No language interpreter was used.  Vaginal Discharge  Quality:  White Severity:  Moderate Onset quality:  Gradual Duration:  3 days Timing:  Constant Progression:  Worsening Chronicity:  New Context: after intercourse   Relieved by:  Nothing Worsened by:  Nothing Ineffective treatments:  None tried Associated symptoms: no abdominal pain   Risk factors: STI exposure   Pt reports her partner told her that he has chlamydia   Past Medical History:  Diagnosis Date  . Asthma   . Heart murmur    pt states small   . Supervision of normal first pregnancy, antepartum 01/25/2016    Clinic [facility] Prenatal Labs Dating LMP = 17 wk u/s Blood type: O/POS/-- (02/24 1537)  Genetic Screen 1 Screen:    AFP:     Quad:     NIPS: Antibody:NEG (02/24 1537) Anatomic US WNL female Rubella: 2.05 (02/24 1537) GTT Third trimester: 77/63/69 RPR: Non Reactive (07/05 0840)  Flu vaccine  HBsAg: NEGATIVE (02/24 1537)  TDaP vaccine                                               HIV: Non Reactive (07/05 0840)  Baby Food               Breast                                GBS: (For PCN allergy, check sensitivities) Contraception  Depo injections Pap: Circumcision NA  Pediatrician  [facility] [place]  Support Person        Patient Active Problem List   Diagnosis Date Noted  . Hyperthyroidism 02/08/2018  . Post term pregnancy at [redacted] weeks gestation 09/04/2016  . Encounter for trial of labor 09/04/2016  . Supervision of normal first pregnancy, antepartum 01/25/2016    Past Surgical History:  Procedure Laterality Date  . TONSILLECTOMY    . TONSILLECTOMY       OB History    Gravida  2   Para  1   Term  1   Preterm  0   AB  1   Living  1      SAB  0   TAB  1   Ectopic  0   Multiple  0   Live Births  1            Home Medications    Prior to Admission medications   Medication Sig Start Date End Date Taking? Authorizing Provider  albuterol (PROVENTIL HFA;VENTOLIN HFA) 108 (90 Base) MCG/ACT inhaler Inhale 2 puffs into the lungs every 6 (six) hours as needed for wheezing or shortness of breath.    [provider]  ibuprofen (ADVIL,MOTRIN) 600 MG tablet Take 1 tablet (600 mg total) by mouth every 6 (six) hours as needed. 07/16/18   Poe, Deirdre C, CNM  metroNIDAZOLE (FLAGYL) 500 MG tablet Take 1 tablet (500 mg total) by mouth 2 (two) times daily. 12/29/18   Elson AreasSofia, Leslie K, PA-C    Family  History Family History  Problem Relation Age of Onset  . Hypertension Other   . Hypertension Maternal Grandmother   . Thyroid disease Maternal Grandmother   . Thyroid disease Mother     Social History Social History   Tobacco Use  . Smoking status: Former Smoker    Types: Cigarettes  . Smokeless tobacco: Never Used  Substance Use Topics  . Alcohol use: No    Alcohol/week: 0.0 standard drinks  . Drug use: Yes    Frequency: 1.0 times per week    Types: Marijuana    Comment: last used 01/2016     Allergies   Hydrocodone   Review of Systems Review of Systems  Gastrointestinal: Negative for abdominal pain.  Genitourinary: Positive for vaginal discharge.  All other systems reviewed and are negative.    Physical Exam Updated Vital Signs BP 125/83 (BP Location: Right Arm)   Pulse 92   Temp 97.7 F (36.5 C) (Oral)   Resp 16   Ht 5' 5.5" (1.664 m)   LMP 12/18/2018 (Exact Date)   SpO2 100%   BMI 22.12 kg/m   Physical Exam Constitutional:      Appearance: She is well-developed.  HENT:     Head: Normocephalic and atraumatic.     Right Ear: Tympanic membrane normal.     Left Ear: Tympanic membrane normal.     Nose: Nose normal.     Mouth/Throat:     Mouth: Mucous membranes are moist.  Eyes:       Conjunctiva/sclera: Conjunctivae normal.     Pupils: Pupils are equal, round, and reactive to light.  Neck:     Musculoskeletal: Normal range of motion and neck supple.  Cardiovascular:     Rate and Rhythm: Normal rate.     Pulses: Normal pulses.  Pulmonary:     Effort: Pulmonary effort is normal.  Abdominal:     Palpations: Abdomen is soft.     Tenderness: There is no abdominal tenderness.  Genitourinary:    Vagina: Vaginal discharge present.     Comments: Vaginal discharge,  Thick white,  Adnexa no masses,  Cervix nontender Musculoskeletal: Normal range of motion.  Skin:    General: Skin is warm.  Neurological:     General: No focal deficit present.     Mental Status: She is alert.  Psychiatric:        Mood and Affect: Mood normal.      ED Treatments / Results  Labs (all labs ordered are listed, but only abnormal results are displayed) Labs Reviewed  WET PREP, GENITAL - Abnormal; Notable for the following components:      Result Value   Clue Cells Wet Prep HPF POC PRESENT (*)    WBC, Wet Prep HPF POC RARE (*)    All other components within normal limits  RPR  HIV ANTIBODY (ROUTINE TESTING W REFLEX)  GC/CHLAMYDIA PROBE AMP ([place]) NOT AT [facility]    EKG None  Radiology No results found.  Procedures Procedures (including critical care time)  Medications Ordered in ED Medications  cefTRIAXone (ROCEPHIN) injection 250 mg (250 mg Intramuscular Given 12/29/18 1215)  azithromycin (ZITHROMAX) tablet 1,000 mg (1,000 mg Oral Given 12/29/18 1214)  lidocaine (PF) (XYLOCAINE) 1 % injection (0.9 mLs  Given 12/29/18 1215)     Initial Impression / Assessment and Plan / ED Course  I have reviewed the triage vital signs and the nursing notes.  Pertinent labs & imaging results that were available during my care  of the patient were reviewed by me and considered in my medical decision making (see chart for details).     MDM  Wet prep shows clue cells.  Pt given  injection of Rocephin and zithromax.  Rx for Flagyl   Final Clinical Impressions(s) / ED Diagnoses   Final diagnoses:  Acute vaginitis    ED Discharge Orders         Ordered    metroNIDAZOLE (FLAGYL) 500 MG tablet  2 times daily     12/29/18 1315        An After Visit Summary was printed and given to the patient.    Osie CheeksSofia, Leslie K, PA-C 12/29/18 1337    Tilden Fossaees, Elizabeth, MD 12/29/18 Izell [id number]

## 2018-12-30 LAB — GC/CHLAMYDIA PROBE AMP (~~LOC~~) NOT AT ARMC
Chlamydia: NEGATIVE
Neisseria Gonorrhea: NEGATIVE

## 2018-12-30 LAB — HIV ANTIBODY (ROUTINE TESTING W REFLEX): HIV Screen 4th Generation wRfx: NONREACTIVE

## 2018-12-30 LAB — RPR: RPR Ser Ql: NONREACTIVE

## 2019-02-09 ENCOUNTER — Ambulatory Visit: Payer: Medicaid Other

## 2019-02-14 ENCOUNTER — Ambulatory Visit: Payer: Medicaid Other

## 2019-02-22 ENCOUNTER — Ambulatory Visit: Payer: Self-pay | Admitting: Obstetrics

## 2019-03-23 ENCOUNTER — Other Ambulatory Visit: Payer: Self-pay | Admitting: Obstetrics

## 2019-04-05 ENCOUNTER — Other Ambulatory Visit: Payer: Self-pay | Admitting: *Deleted

## 2019-04-05 DIAGNOSIS — B9689 Other specified bacterial agents as the cause of diseases classified elsewhere: Secondary | ICD-10-CM

## 2019-04-05 DIAGNOSIS — N760 Acute vaginitis: Principal | ICD-10-CM

## 2019-04-05 MED ORDER — TINIDAZOLE 500 MG PO TABS
ORAL_TABLET | ORAL | 0 refills | Status: DC
Start: 2019-04-05 — End: 2019-11-15

## 2019-04-05 NOTE — Progress Notes (Signed)
Pt called to office with BV, request tx. Pt did not get Rx that was sent in April, so Rx reordered today.

## 2019-04-06 ENCOUNTER — Ambulatory Visit: Payer: Self-pay | Admitting: Obstetrics

## 2019-05-17 ENCOUNTER — Ambulatory Visit: Payer: Medicaid Other | Admitting: Obstetrics

## 2019-07-06 ENCOUNTER — Encounter

## 2019-11-15 ENCOUNTER — Encounter (HOSPITAL_COMMUNITY): Payer: Self-pay | Admitting: Obstetrics & Gynecology

## 2019-11-15 ENCOUNTER — Inpatient Hospital Stay (HOSPITAL_COMMUNITY): Payer: Medicaid Other

## 2019-11-15 ENCOUNTER — Other Ambulatory Visit: Payer: Self-pay

## 2019-11-15 ENCOUNTER — Inpatient Hospital Stay (HOSPITAL_COMMUNITY)
Admission: AD | Admit: 2019-11-15 | Discharge: 2019-11-15 | Disposition: A | Discharge status: Home / Self Care | Payer: Medicaid Other | Attending: Obstetrics & Gynecology | Admitting: Obstetrics & Gynecology

## 2019-11-15 DIAGNOSIS — R109 Unspecified abdominal pain: Secondary | ICD-10-CM | POA: Insufficient documentation

## 2019-11-15 DIAGNOSIS — Z885 Allergy status to narcotic agent status: Secondary | ICD-10-CM | POA: Insufficient documentation

## 2019-11-15 DIAGNOSIS — O26891 Other specified pregnancy related conditions, first trimester: Secondary | ICD-10-CM

## 2019-11-15 DIAGNOSIS — Z79899 Other long term (current) drug therapy: Secondary | ICD-10-CM | POA: Insufficient documentation

## 2019-11-15 DIAGNOSIS — O99891 Other specified diseases and conditions complicating pregnancy: Secondary | ICD-10-CM | POA: Diagnosis present

## 2019-11-15 DIAGNOSIS — B9689 Other specified bacterial agents as the cause of diseases classified elsewhere: Secondary | ICD-10-CM | POA: Diagnosis not present

## 2019-11-15 DIAGNOSIS — M549 Dorsalgia, unspecified: Secondary | ICD-10-CM | POA: Insufficient documentation

## 2019-11-15 DIAGNOSIS — Z87891 Personal history of nicotine dependence: Secondary | ICD-10-CM | POA: Insufficient documentation

## 2019-11-15 DIAGNOSIS — Z3A01 Less than 8 weeks gestation of pregnancy: Secondary | ICD-10-CM | POA: Diagnosis not present

## 2019-11-15 DIAGNOSIS — N760 Acute vaginitis: Secondary | ICD-10-CM | POA: Diagnosis not present

## 2019-11-15 DIAGNOSIS — O3680X0 Pregnancy with inconclusive fetal viability, not applicable or unspecified: Secondary | ICD-10-CM

## 2019-11-15 DIAGNOSIS — N9089 Other specified noninflammatory disorders of vulva and perineum: Secondary | ICD-10-CM

## 2019-11-15 LAB — URINALYSIS, ROUTINE W REFLEX MICROSCOPIC
Bilirubin Urine: NEGATIVE
Glucose, UA: NEGATIVE mg/dL
Ketones, ur: NEGATIVE mg/dL
Leukocytes,Ua: NEGATIVE
Nitrite: NEGATIVE
Protein, ur: NEGATIVE mg/dL
Specific Gravity, Urine: 1.017 (ref 1.005–1.030)
pH: 6 (ref 5.0–8.0)

## 2019-11-15 LAB — CBC
HCT: 35.5 % — ABNORMAL LOW (ref 36.0–46.0)
Hemoglobin: 12.3 g/dL (ref 12.0–15.0)
MCH: 30.6 pg (ref 26.0–34.0)
MCHC: 34.6 g/dL (ref 30.0–36.0)
MCV: 88.3 fL (ref 80.0–100.0)
Platelets: 301 10*3/uL (ref 150–400)
RBC: 4.02 MIL/uL (ref 3.87–5.11)
RDW: 12.6 % (ref 11.5–15.5)
WBC: 5.8 10*3/uL (ref 4.0–10.5)
nRBC: 0 % (ref 0.0–0.2)

## 2019-11-15 LAB — WET PREP, GENITAL
Sperm: NONE SEEN
Trich, Wet Prep: NONE SEEN
Yeast Wet Prep HPF POC: NONE SEEN

## 2019-11-15 LAB — HCG, QUANTITATIVE, PREGNANCY: hCG, Beta Chain, Quant, S: 408 m[IU]/mL — ABNORMAL HIGH (ref <5)

## 2019-11-15 LAB — POCT PREGNANCY, URINE: Preg Test, Ur: POSITIVE — AB

## 2019-11-15 MED ORDER — METRONIDAZOLE 500 MG PO TABS: 500.0000 mg | ORAL_TABLET | Freq: Two times a day (BID) | ORAL | 0 refills | Status: DC

## 2019-11-15 NOTE — MAU Provider Note (Signed)
Chief Complaint: Abdominal Pain, Back Pain, and Possible Pregnancy   First Provider Initiated Contact with Patient 11/15/19 1626     SUBJECTIVE HPI: Darlene Morgan is a 23 y.o. G3P1011 at [redacted]w[redacted]d who presents to Maternity Admissions reporting abdominal pain and back pain. Symptoms started 5 days ago. Reports intermittent pain throughout her lower back and in right lower abdomen. Denies fever/chills, n/v/d, constipation, dysuria, or vaginal bleeding. Has had recent change in discharge; describes thin white discharge with fishy odor. No vaginal itching. Also has a "hair bump" on her left labia that she pulled a hair out of last night. Requesting to be tested for herpes.   Location: abdomen & back Quality: cramping Severity: 3/10 on pain scale Duration: 5 days Timing: intermittent Modifying factors: nothing makes better or worse Associated signs and symptoms: vaginal discharge  Past Medical History:  Diagnosis Date  . Asthma   . Heart murmur    pt states small   . Supervision of normal first pregnancy, antepartum 01/25/2016    Clinic [facility] Prenatal Labs Dating LMP = 17 wk u/s Blood type: O/POS/-- (02/24 1537)  Genetic Screen 1 Screen:    AFP:     Quad:     NIPS: Antibody:NEG (02/24 1537) Anatomic US WNL female Rubella: 2.05 (02/24 1537) GTT Third trimester: 77/63/69 RPR: Non Reactive (07/05 0840)  Flu vaccine  HBsAg: NEGATIVE (02/24 1537)  TDaP vaccine                                               HIV: Non Reactive (07/05 0840)  Baby Food               Breast                                GBS: (For PCN allergy, check sensitivities) Contraception  Depo injections Pap: Circumcision NA  Pediatrician  [facility] [place]  Support Person       OB History  Gravida Para Term Preterm AB Living  3 1 1  0 1 1  SAB TAB Ectopic Multiple Live Births  0 1 0 0 1    # Outcome Date GA Lbr Len/2nd Weight Sex Delivery Anes PTL Lv  3 Current           2 TAB 07/01/18 [redacted]w[redacted]d         1 Term 09/05/16 [redacted]w[redacted]d 08:54 /  01:20 3181 g F Vag-Spont EPI  LIV   Past Surgical History:  Procedure Laterality Date  . TONSILLECTOMY     Social History   Socioeconomic History  . Marital status: Single    Spouse name: Not on file  . Number of children: Not on file  . Years of education: Not on file  . Highest education level: Not on file  Occupational History  . Not on file  Tobacco Use  . Smoking status: Former Smoker    Types: Cigarettes  . Smokeless tobacco: Never Used  Substance and Sexual Activity  . Alcohol use: Yes    Alcohol/week: 0.0 standard drinks  . Drug use: Yes    Frequency: 1.0 times per week    Types: Marijuana    Comment: last used 01/2016  . Sexual activity: Yes  Other Topics Concern  . Not on file  Social History Narrative  .  Not on file   Social Determinants of Health   Financial Resource Strain:   . Difficulty of Paying Living Expenses: Not on file  Food Insecurity:   . Worried About Charity fundraiser in the Last Year: Not on file  . Ran Out of Food in the Last Year: Not on file  Transportation Needs:   . Lack of Transportation (Medical): Not on file  . Lack of Transportation (Non-Medical): Not on file  Physical Activity:   . Days of Exercise per Week: Not on file  . Minutes of Exercise per Session: Not on file  Stress:   . Feeling of Stress : Not on file  Social Connections:   . Frequency of Communication with Friends and Family: Not on file  . Frequency of Social Gatherings with Friends and Family: Not on file  . Attends Religious Services: Not on file  . Active Member of Clubs or Organizations: Not on file  . Attends Archivist Meetings: Not on file  . Marital Status: Not on file  Intimate Partner Violence:   . Fear of Current or Ex-Partner: Not on file  . Emotionally Abused: Not on file  . Physically Abused: Not on file  . Sexually Abused: Not on file   Family History  Problem Relation Age of Onset  . Hypertension Other   . Hypertension Maternal  Grandmother   . Thyroid disease Maternal Grandmother   . Thyroid disease Mother    No current facility-administered medications on file prior to encounter.   Current Outpatient Medications on File Prior to Encounter  Medication Sig Dispense Refill  . albuterol (PROVENTIL HFA;VENTOLIN HFA) 108 (90 Base) MCG/ACT inhaler Inhale 2 puffs into the lungs every 6 (six) hours as needed for wheezing or shortness of breath.    Marland Kitchen ibuprofen (ADVIL,MOTRIN) 600 MG tablet Take 1 tablet (600 mg total) by mouth every 6 (six) hours as needed. 30 tablet 1  . metroNIDAZOLE (FLAGYL) 500 MG tablet Take 1 tablet (500 mg total) by mouth 2 (two) times daily. 14 tablet 0  . tinidazole (TINDAMAX) 500 MG tablet TAKE 2 TABLETS BY MOUTH ONCE DAILY WITH BREAKFAST 10 tablet 0   Allergies  Allergen Reactions  . Hydrocodone Itching    I have reviewed patient's Past Medical Hx, Surgical Hx, Family Hx, Social Hx, medications and allergies.   Review of Systems  Constitutional: Negative.   Gastrointestinal: Positive for abdominal pain. Negative for constipation, diarrhea, nausea and vomiting.  Genitourinary: Positive for genital sores and vaginal discharge. Negative for dysuria and vaginal bleeding.  Musculoskeletal: Positive for back pain.    OBJECTIVE Patient Vitals for the past 24 hrs:  BP Temp Temp src Pulse Resp SpO2 Height Weight  11/15/19 1423 115/66 98.4 F (36.9 C) Oral 92 16 99 % 5\' 5"  (1.651 m) 62.5 kg   Constitutional: Well-developed, well-nourished female in no acute distress.  Cardiovascular: normal rate & rhythm, no murmur Respiratory: normal rate and effort. Lung sounds clear throughout GI: Abd soft, non-tender, Pos BS x 4. No guarding or rebound tenderness MS: Extremities nontender, no edema, normal ROM Neurologic: Alert and oriented x 4.  Pelvic: 0.5 cm raised lesion on left lower labia with small ulcerated center  LAB RESULTS Results for orders placed or performed during the hospital encounter  of 11/15/19 (from the past 24 hour(s))  Pregnancy, urine POC     Status: Abnormal   Collection Time: 11/15/19  2:58 PM  Result Value Ref Range   Preg Test,  Ur POSITIVE (A) NEGATIVE  Urinalysis, Routine w reflex microscopic     Status: Abnormal   Collection Time: 11/15/19  3:09 PM  Result Value Ref Range   Color, Urine YELLOW YELLOW   APPearance CLEAR CLEAR   Specific Gravity, Urine 1.017 1.005 - 1.030   pH 6.0 5.0 - 8.0   Glucose, UA NEGATIVE NEGATIVE mg/dL   Hgb urine dipstick SMALL (A) NEGATIVE   Bilirubin Urine NEGATIVE NEGATIVE   Ketones, ur NEGATIVE NEGATIVE mg/dL   Protein, ur NEGATIVE NEGATIVE mg/dL   Nitrite NEGATIVE NEGATIVE   Leukocytes,Ua NEGATIVE NEGATIVE   RBC / HPF 0-5 0 - 5 RBC/hpf   WBC, UA 0-5 0 - 5 WBC/hpf   Bacteria, UA RARE (A) NONE SEEN   Squamous Epithelial / LPF 0-5 0 - 5   Mucus PRESENT   CBC     Status: Abnormal   Collection Time: 11/15/19  4:45 PM  Result Value Ref Range   WBC 5.8 4.0 - 10.5 K/uL   RBC 4.02 3.87 - 5.11 MIL/uL   Hemoglobin 12.3 12.0 - 15.0 g/dL   HCT 16.135.5 (L) 09.636.0 - 04.546.0 %   MCV 88.3 80.0 - 100.0 fL   MCH 30.6 26.0 - 34.0 pg   MCHC 34.6 30.0 - 36.0 g/dL   RDW 40.912.6 81.111.5 - 91.415.5 %   Platelets 301 150 - 400 K/uL   nRBC 0.0 0.0 - 0.2 %  hCG, quantitative, pregnancy     Status: Abnormal   Collection Time: 11/15/19  4:45 PM  Result Value Ref Range   hCG, Beta Chain, Quant, S 408 (H) <5 mIU/mL  Wet prep, genital     Status: Abnormal   Collection Time: 11/15/19  4:52 PM   Specimen: Other Source  Result Value Ref Range   Yeast Wet Prep HPF POC NONE SEEN NONE SEEN   Trich, Wet Prep NONE SEEN NONE SEEN   Clue Cells Wet Prep HPF POC PRESENT (A) NONE SEEN   WBC, Wet Prep HPF POC FEW (A) NONE SEEN   Sperm NONE SEEN     IMAGING US OB LESS THAN 14 WEEKS WITH OB TRANSVAGINAL  Result Date: 11/15/2019 CLINICAL DATA:  Positive UPT, abdominal and back pain EXAM: OBSTETRIC <14 WK US AND TRANSVAGINAL OB US TECHNIQUE: Both transabdominal and  transvaginal ultrasound examinations were performed for complete evaluation of the gestation as well as the maternal uterus, adnexal regions, and pelvic cul-de-sac. Transvaginal technique was performed to assess early pregnancy. COMPARISON:  Pelvic ultrasound July 16, 2018 FINDINGS: LMP: 10/15/2019 GA by LMP: 4 w  3 d Intrauterine gestational sac: None Yolk sac:  Not Visualized. Embryo:  Not Visualized. Cardiac Activity: Not Visualized. Maternal uterus/adnexae: Uterus is retroflexed. Endometrial thickness measures 11 mm. No visible endometrial fluid collection is seen. Maternal ovaries are unremarkable. Trace anechoic fluid in the pelvis is nonspecific. IMPRESSION: No intrauterine pregnancy visualized. Differential considerations would include early intrauterine pregnancy too early to visualize, spontaneous abortion, or occult ectopic pregnancy. Recommend close clinical followup and serial quantitative beta HCGs and ultrasounds. Electronically Signed   By: Kreg ShropshirePrice  DeHay M.D.   On: 11/15/2019 17:33    MAU COURSE Orders Placed This Encounter  Procedures  . Wet prep, genital  . US OB LESS THAN 14 WEEKS WITH OB TRANSVAGINAL  . Urinalysis, Routine w reflex microscopic  . CBC  . hCG, quantitative, pregnancy  . Herpes simplex virus (HSV), DNA by PCR Sterile Swab  . Pregnancy, urine POC  . Discharge patient  Meds ordered this encounter  Medications  . metroNIDAZOLE (FLAGYL) 500 MG tablet    Sig: Take 1 tablet (500 mg total) by mouth 2 (two) times daily.    Dispense:  14 tablet    Refill:  0    Order Specific Question:   Supervising Provider    Answer:   Adam Phenix [[id number]]    MDM +UPT UA, wet prep, GC/chlamydia, CBC, ABO/Rh, quant hCG, and [place] today to rule out ectopic pregnancy which can be life threatening.   Ultrasound shows no IUP or adnexal mass. HCG 408 today. Patient goes to [facility] - will get stat HCG on Friday in the office.  This abdominal pain could represent a normal pregnancy,  spontaneous abortion, or even an ectopic pregnancy which can be life-threatening. Cultures were obtained to rule out pelvic infection.   Lesions looks more like irritation from ingrown hair, but does have ulcerated center so HSV swab was collected.   Wet prep + for clue cells & pt complaining of foul smelling discharge. Will tx for BV.   ASSESSMENT 1. Pregnancy of unknown anatomic location   2. Abdominal pain during pregnancy in first trimester   3. Bacterial vaginosis   4. Vulvar lesion     PLAN Discharge home in stable condition. SAB vs ectopic precautions GC/CT & HSV swab pending Rx flagyl Msg send to [facility] for stat HCG in office on Friday  Follow-up Information    [facility] Follow up.   Why: the office will call you to schedule a lab visit for this Friday.  Contact information: [address] [place] [address] [phone]       [facility] Follow up.   Specialty: Obstetrics and Gynecology Why: return for worsening symptoms Contact information: [address] [id number] [place]hemina Bonito [place] [address] [phone]         Allergies as of 11/15/2019      Reactions   Hydrocodone Itching      Medication List    STOP taking these medications   ibuprofen 600 MG tablet Commonly known as: ADVIL   tinidazole 500 MG tablet Commonly known as: TINDAMAX     TAKE these medications   albuterol 108 (90 Base) MCG/ACT inhaler Commonly known as: VENTOLIN HFA Inhale 2 puffs into the lungs every 6 (six) hours as needed for wheezing or shortness of breath.   metroNIDAZOLE 500 MG tablet Commonly known as: FLAGYL Take 1 tablet (500 mg total) by mouth 2 (two) times daily.        Judeth Horn, NP 11/15/2019  6:12 PM

## 2019-11-15 NOTE — MAU Note (Signed)
+  HPT last 2 days.  Has been cramping and having low back pain.

## 2019-11-15 NOTE — Discharge Instructions (Signed)
Return to care   If you have heavier bleeding that soaks through more that 2 pads per hour for an hour or more  If you bleed so much that you feel like you might pass out or you do pass out  If you have significant abdominal pain that is not improved with Tylenol      Bacterial Vaginosis  Bacterial vaginosis is a vaginal infection that occurs when the normal balance of bacteria in the vagina is disrupted. It results from an overgrowth of certain bacteria. This is the most common vaginal infection among women ages 19-44. Because bacterial vaginosis increases your risk for STIs (sexually transmitted infections), getting treated can help reduce your risk for chlamydia, gonorrhea, herpes, and HIV (human immunodeficiency virus). Treatment is also important for preventing complications in pregnant women, because this condition can cause an early (premature) delivery. What are the causes? This condition is caused by an increase in harmful bacteria that are normally present in small amounts in the vagina. However, the reason that the condition develops is not fully understood. What increases the risk? The following factors may make you more likely to develop this condition:  Having a new sexual partner or multiple sexual partners.  Having unprotected sex.  Douching.  Having an intrauterine device (IUD).  Smoking.  Drug and alcohol abuse.  Taking certain antibiotic medicines.  Being pregnant. You cannot get bacterial vaginosis from toilet seats, bedding, swimming pools, or contact with objects around you. What are the signs or symptoms? Symptoms of this condition include:  Grey or white vaginal discharge. The discharge can also be watery or foamy.  A fish-like odor with discharge, especially after sexual intercourse or during menstruation.  Itching in and around the vagina.  Burning or pain with urination. Some women with bacterial vaginosis have no signs or symptoms. How is  this diagnosed? This condition is diagnosed based on:  Your medical history.  A physical exam of the vagina.  Testing a sample of vaginal fluid under a microscope to look for a large amount of bad bacteria or abnormal cells. Your health care provider may use a cotton swab or a small wooden spatula to collect the sample. How is this treated? This condition is treated with antibiotics. These may be given as a pill, a vaginal cream, or a medicine that is put into the vagina (suppository). If the condition comes back after treatment, a second round of antibiotics may be needed. Follow these instructions at home: Medicines  Take over-the-counter and prescription medicines only as told by your health care provider.  Take or use your antibiotic as told by your health care provider. Do not stop taking or using the antibiotic even if you start to feel better. General instructions  If you have a female sexual partner, tell her that you have a vaginal infection. She should see her health care provider and be treated if she has symptoms. If you have a female sexual partner, he does not need treatment.  During treatment: ? Avoid sexual activity until you finish treatment. ? Do not douche. ? Avoid alcohol as directed by your health care provider. ? Avoid breastfeeding as directed by your health care provider.  Drink enough water and fluids to keep your urine clear or pale yellow.  Keep the area around your vagina and rectum clean. ? Wash the area daily with warm water. ? Wipe yourself from front to back after using the toilet.  Keep all follow-up visits as told by your  health care provider. This is important. How is this prevented?  Do not douche.  Wash the outside of your vagina with warm water only.  Use protection when having sex. This includes latex condoms and dental dams.  Limit how many sexual partners you have. To help prevent bacterial vaginosis, it is best to have sex with just one  partner (monogamous).  Make sure you and your sexual partner are tested for STIs.  Wear cotton or cotton-lined underwear.  Avoid wearing tight pants and pantyhose, especially during summer.  Limit the amount of alcohol that you drink.  Do not use any products that contain nicotine or tobacco, such as cigarettes and e-cigarettes. If you need help quitting, ask your health care provider.  Do not use illegal drugs. Where to find more information  Centers for Disease Control and Prevention: SolutionApps.co.za  American Sexual Health Association (ASHA): www.ashastd.org  U.S. Department of Health and Health and safety inspector, Office on Women's Health: ConventionalMedicines.si or http://www.anderson-williamson.info/ Contact a health care provider if:  Your symptoms do not improve, even after treatment.  You have more discharge or pain when urinating.  You have a fever.  You have pain in your abdomen.  You have pain during sex.  You have vaginal bleeding between periods. Summary  Bacterial vaginosis is a vaginal infection that occurs when the normal balance of bacteria in the vagina is disrupted.  Because bacterial vaginosis increases your risk for STIs (sexually transmitted infections), getting treated can help reduce your risk for chlamydia, gonorrhea, herpes, and HIV (human immunodeficiency virus). Treatment is also important for preventing complications in pregnant women, because the condition can cause an early (premature) delivery.  This condition is treated with antibiotic medicines. These may be given as a pill, a vaginal cream, or a medicine that is put into the vagina (suppository). This information is not intended to replace advice given to you by your health care provider. Make sure you discuss any questions you have with your health care provider. Document Released: 11/17/2005 Document Revised: 10/30/2017 Document Reviewed: 08/02/2016 Elsevier Patient Education   2020 [employer].  Safe Medications in Pregnancy   Acne: Benzoyl Peroxide Salicylic Acid  Backache/Headache: Tylenol: 2 regular strength every 4 hours OR              2 Extra strength every 6 hours  Colds/Coughs/Allergies: Benadryl (alcohol free) 25 mg every 6 hours as needed Breath right strips Claritin Cepacol throat lozenges Chloraseptic throat spray Cold-Eeze- up to three times per day Cough drops, alcohol free Flonase (by prescription only) Guaifenesin Mucinex Robitussin DM (plain only, alcohol free) Saline nasal spray/drops Sudafed (pseudoephedrine) & Actifed ** use only after [redacted] weeks gestation and if you do not have high blood pressure Tylenol Vicks Vaporub Zinc lozenges Zyrtec   Constipation: Colace Ducolax suppositories Fleet enema Glycerin suppositories Metamucil Milk of magnesia Miralax Senokot Smooth move tea  Diarrhea: Kaopectate Imodium A-D  *NO pepto Bismol  Hemorrhoids: Anusol Anusol HC Preparation H Tucks  Indigestion: Tums Maalox Mylanta Zantac  Pepcid  Insomnia: Benadryl (alcohol free) 25mg  every 6 hours as needed Tylenol PM Unisom, no Gelcaps  Leg Cramps: Tums MagGel  Nausea/Vomiting:  Bonine Dramamine Emetrol Ginger extract Sea bands Meclizine  Nausea medication to take during pregnancy:  Unisom (doxylamine succinate 25 mg tablets) Take one tablet daily at bedtime. If symptoms are not adequately controlled, the dose can be increased to a maximum recommended dose of two tablets daily (1/2 tablet in the morning, 1/2 tablet mid-afternoon and one at  bedtime). Vitamin B6 100mg  tablets. Take one tablet twice a day (up to 200 mg per day).  Skin Rashes: Aveeno products Benadryl cream or 25mg  every 6 hours as needed Calamine Lotion 1% cortisone cream  Yeast infection: Gyne-lotrimin 7 Monistat 7  Gum/tooth pain: Anbesol  **If taking multiple medications, please check labels to avoid duplicating the same active  ingredients **take medication as directed on the label ** Do not exceed 4000 mg of tylenol in 24 hours **Do not take medications that contain aspirin or ibuprofen

## 2019-11-16 LAB — GC/CHLAMYDIA PROBE AMP (~~LOC~~) NOT AT ARMC
Chlamydia: NEGATIVE
Comment: NEGATIVE
Comment: NORMAL
Neisseria Gonorrhea: NEGATIVE

## 2019-11-18 ENCOUNTER — Other Ambulatory Visit: Payer: Medicaid Other

## 2019-11-18 ENCOUNTER — Telehealth: Payer: Self-pay | Admitting: Obstetrics and Gynecology

## 2019-11-18 ENCOUNTER — Other Ambulatory Visit: Payer: Self-pay

## 2019-11-18 DIAGNOSIS — O3680X0 Pregnancy with inconclusive fetal viability, not applicable or unspecified: Secondary | ICD-10-CM

## 2019-11-18 LAB — BETA HCG QUANT (REF LAB): hCG Quant: 1189 m[IU]/mL

## 2019-11-18 NOTE — Telephone Encounter (Signed)
Reviewed stat HCG from [facility]. Patient with pregnancy of unknown location in MAU on 11/15/19, HCG done today, has risen appropriately. Called patient to inform her of results, will need [place] 7-10 days and office will call her to schedule this. Reviewed that until intra-uterine pregnancy is seen, cannot know for sure it is a normal pregnancy. Reviewed ectopic precautions and reasons to present to MAU. Pt verbalizes understanding of the above, answered all questions.   [name], M.D. Attending Center for [employer] farm manager)

## 2019-11-18 NOTE — Progress Notes (Unsigned)
Stat hcg ordered until 2nd attending and advised to call results to that provider.

## 2019-11-22 ENCOUNTER — Other Ambulatory Visit: Payer: Self-pay

## 2019-11-22 DIAGNOSIS — O3680X0 Pregnancy with inconclusive fetal viability, not applicable or unspecified: Secondary | ICD-10-CM

## 2019-12-05 ENCOUNTER — Telehealth: Payer: Self-pay | Admitting: Family Medicine

## 2019-12-05 NOTE — Telephone Encounter (Signed)
I called pt to discuss her plan of care including ultrasound scheduled for tomorrow. Pt stated that she is no longer pregnant because she had abortion. Pt verified that she is not having any heavy bleeding or abdominal pain. She was advised that she may contact us if she has additional questions or problems. Pt voiced understanding.

## 2019-12-05 NOTE — Telephone Encounter (Signed)
Called the patient with an appointment reminder however the patient stated she has been trying to call in to cancel the appointment because she does not have the baby anymore. The patient stated a physician did not provide her with the information. She had a message left on her phone from an office and knew that was the reason. Informed the patient a nurse will be notified.

## 2019-12-06 ENCOUNTER — Ambulatory Visit: Payer: Medicaid Other

## 2019-12-06 ENCOUNTER — Ambulatory Visit (HOSPITAL_COMMUNITY): Admission: RE | Admit: 2019-12-06 | Payer: Medicaid Other | Source: Ambulatory Visit

## 2019-12-30 ENCOUNTER — Ambulatory Visit: Payer: Medicaid Other | Attending: Internal Medicine

## 2019-12-30 DIAGNOSIS — Z20822 Contact with and (suspected) exposure to covid-19: Secondary | ICD-10-CM

## 2019-12-31 LAB — NOVEL CORONAVIRUS, NAA: SARS-CoV-2, NAA: NOT DETECTED

## 2020-01-04 ENCOUNTER — Ambulatory Visit: Payer: Medicaid Other | Attending: Internal Medicine

## 2020-01-04 DIAGNOSIS — Z20822 Contact with and (suspected) exposure to covid-19: Secondary | ICD-10-CM

## 2020-01-05 LAB — NOVEL CORONAVIRUS, NAA: SARS-CoV-2, NAA: NOT DETECTED

## 2020-02-07 ENCOUNTER — Other Ambulatory Visit: Payer: Self-pay

## 2020-02-07 ENCOUNTER — Encounter: Payer: Self-pay | Admitting: Obstetrics

## 2020-02-07 ENCOUNTER — Other Ambulatory Visit (HOSPITAL_COMMUNITY)
Admission: RE | Admit: 2020-02-07 | Discharge: 2020-02-07 | Disposition: A | Discharge status: Home / Self Care | Payer: Medicaid Other | Source: Ambulatory Visit | Attending: Obstetrics | Admitting: Obstetrics

## 2020-02-07 ENCOUNTER — Ambulatory Visit: Payer: Medicaid Other | Admitting: Obstetrics

## 2020-02-07 VITALS — BP 130/80 | HR 103 | Wt 154.0 lb

## 2020-02-07 DIAGNOSIS — N898 Other specified noninflammatory disorders of vagina: Secondary | ICD-10-CM | POA: Diagnosis not present

## 2020-02-07 DIAGNOSIS — Z30013 Encounter for initial prescription of injectable contraceptive: Secondary | ICD-10-CM | POA: Diagnosis not present

## 2020-02-07 DIAGNOSIS — Z3042 Encounter for surveillance of injectable contraceptive: Secondary | ICD-10-CM

## 2020-02-07 DIAGNOSIS — Z3009 Encounter for other general counseling and advice on contraception: Secondary | ICD-10-CM | POA: Diagnosis not present

## 2020-02-07 DIAGNOSIS — Z3202 Encounter for pregnancy test, result negative: Secondary | ICD-10-CM

## 2020-02-07 DIAGNOSIS — Z113 Encounter for screening for infections with a predominantly sexual mode of transmission: Secondary | ICD-10-CM

## 2020-02-07 LAB — POCT URINE PREGNANCY: Preg Test, Ur: NEGATIVE

## 2020-02-07 MED ORDER — MEDROXYPROGESTERONE ACETATE 150 MG/ML IM SUSP: 150.0000 mg | INTRAMUSCULAR | 4 refills | Status: DC

## 2020-02-07 MED ORDER — MEDROXYPROGESTERONE ACETATE 150 MG/ML IM SUSP
150.0000 mg | Freq: Once | INTRAMUSCULAR | Status: AC
  Administered 2020-02-07: 150 mg via INTRAMUSCULAR

## 2020-02-07 MED ORDER — METRONIDAZOLE 500 MG PO TABS: 500.0000 mg | ORAL_TABLET | Freq: Two times a day (BID) | ORAL | 2 refills | Status: DC

## 2020-02-07 NOTE — Progress Notes (Signed)
Patient ID: Darlene Morgan, female   DOB: 1996-03-06, 24 y.o.   MRN: [id number]  Chief Complaint  Patient presents with  . Gynecologic Exam    HPI Darlene Morgan is a 24 y.o. female.  Complains of malodorous vaginal discharge. HPI  Past Medical History:  Diagnosis Date  . Asthma   . Heart murmur    pt states small   . Supervision of normal first pregnancy, antepartum 01/25/2016    Clinic [facility] Prenatal Labs Dating LMP = 17 wk u/s Blood type: O/POS/-- (02/24 1537)  Genetic Screen 1 Screen:    AFP:     Quad:     NIPS: Antibody:NEG (02/24 1537) Anatomic US WNL female Rubella: 2.05 (02/24 1537) GTT Third trimester: 77/63/69 RPR: Non Reactive (07/05 0840)  Flu vaccine  HBsAg: NEGATIVE (02/24 1537)  TDaP vaccine                                               HIV: Non Reactive (07/05 0840)  Baby Food               Breast                                GBS: (For PCN allergy, check sensitivities) Contraception  Depo injections Pap: Circumcision NA  Pediatrician  [facility] [place]  Support Person        Past Surgical History:  Procedure Laterality Date  . TONSILLECTOMY      Family History  Problem Relation Age of Onset  . Hypertension Other   . Hypertension Maternal Grandmother   . Thyroid disease Maternal Grandmother   . Thyroid disease Mother     Social History Social History   Tobacco Use  . Smoking status: Former Smoker    Types: Cigarettes  . Smokeless tobacco: Never Used  Substance Use Topics  . Alcohol use: Yes    Alcohol/week: 0.0 standard drinks  . Drug use: Yes    Frequency: 1.0 times per week    Types: Marijuana    Comment: last used 01/2016    Allergies  Allergen Reactions  . Hydrocodone Itching    Current Outpatient Medications  Medication Sig Dispense Refill  . albuterol (PROVENTIL HFA;VENTOLIN HFA) 108 (90 Base) MCG/ACT inhaler Inhale 2 puffs into the lungs every 6 (six) hours as needed for wheezing or shortness of breath.    . medroxyPROGESTERone  (DEPO-PROVERA) 150 MG/ML injection Inject 1 mL (150 mg total) into the muscle every 3 (three) months. 1 mL 4  . metroNIDAZOLE (FLAGYL) 500 MG tablet Take 1 tablet (500 mg total) by mouth 2 (two) times daily. 14 tablet 2   No current facility-administered medications for this visit.    Review of Systems Review of Systems Constitutional: negative for fatigue and weight loss Respiratory: negative for cough and wheezing Cardiovascular: negative for chest pain, fatigue and palpitations Gastrointestinal: negative for abdominal pain and change in bowel habits Genitourinary:positive for malodorous vaginal discharge Integument/breast: negative for nipple discharge Musculoskeletal:negative for myalgias Neurological: negative for gait problems and tremors Behavioral/Psych: negative for abusive relationship, depression Endocrine: negative for temperature intolerance      Blood pressure 130/80, pulse (!) 103, weight 154 lb (69.9 kg), last menstrual period 01/11/2020, unknown if currently breastfeeding.  Physical Exam Physical Exam General:   alert  Skin:   no rash or abnormalities  Lungs:   clear to auscultation bilaterally  Heart:   regular rate and rhythm, S1, S2 normal, no murmur, click, rub or gallop  Breasts:   normal without suspicious masses, skin or nipple changes or axillary nodes  Abdomen:  normal findings: no organomegaly, soft, non-tender and no hernia  Pelvis:  External genitalia: normal general appearance Urinary system: urethral meatus normal and bladder without fullness, nontender Vaginal: normal without tenderness, induration or masses Cervix: normal appearance Adnexa: normal bimanual exam Uterus: anteverted and non-tender, normal size    50% of 15 min visit spent on counseling and coordination of care.   Data Reviewed Labs Wet Prep  Assessment    Rx: 1. Vaginal discharge Rx: - Cervicovaginal ancillary only( [place]) - metroNIDAZOLE (FLAGYL) 500 MG tablet; Take  1 tablet (500 mg total) by mouth 2 (two) times daily.  Dispense: 14 tablet; Refill: 2  2. Screen for STD (sexually transmitted disease) Rx: - HIV Antibody (routine testing w rflx) - Hepatitis B surface antigen - RPR - Hepatitis C antibody  3. Encounter for other general counseling or advice on contraception - wants Depo Provera injections  4. Encounter for initial prescription of injectable contraceptive Rx: - medroxyPROGESTERone (DEPO-PROVERA) 150 MG/ML injection; Inject 1 mL (150 mg total) into the muscle every 3 (three) months.  Dispense: 1 mL; Refill: 4  5. Pregnancy test negative Rx: - POCT urine pregnancy   Plan   Follow up prn    Orders Placed This Encounter  Procedures  . HIV Antibody (routine testing w rflx)  . Hepatitis B surface antigen  . RPR  . Hepatitis C antibody  . POCT urine pregnancy   Meds ordered this encounter  Medications  . medroxyPROGESTERone (DEPO-PROVERA) 150 MG/ML injection    Sig: Inject 1 mL (150 mg total) into the muscle every 3 (three) months.    Dispense:  1 mL    Refill:  4  . medroxyPROGESTERone (DEPO-PROVERA) injection 150 mg  . metroNIDAZOLE (FLAGYL) 500 MG tablet    Sig: Take 1 tablet (500 mg total) by mouth 2 (two) times daily.    Dispense:  14 tablet    Refill:  2        Shelly Bombard, MD 02/07/2020 3:30 PM

## 2020-02-07 NOTE — Progress Notes (Signed)
Pt was ordered office supplied Depo at today's visit.  Pt UPT in office is negative today. Depo given, pt tolerated well. Pt advised to return for next depo.  Administrations This Visit    medroxyPROGESTERone (DEPO-PROVERA) injection 150 mg    Admin Date 02/07/2020 Action Given Dose 150 mg Route Intramuscular Administered By Lanney Gins, CMA

## 2020-02-08 ENCOUNTER — Other Ambulatory Visit: Payer: Self-pay | Admitting: Obstetrics

## 2020-02-08 DIAGNOSIS — B3731 Acute candidiasis of vulva and vagina: Secondary | ICD-10-CM

## 2020-02-08 DIAGNOSIS — B373 Candidiasis of vulva and vagina: Secondary | ICD-10-CM

## 2020-02-08 LAB — HIV ANTIBODY (ROUTINE TESTING W REFLEX): HIV Screen 4th Generation wRfx: NONREACTIVE

## 2020-02-08 LAB — CERVICOVAGINAL ANCILLARY ONLY
Bacterial Vaginitis (gardnerella): POSITIVE — AB
Candida Glabrata: NEGATIVE
Candida Vaginitis: POSITIVE — AB
Chlamydia: NEGATIVE
Comment: NEGATIVE
Comment: NEGATIVE
Comment: NEGATIVE
Comment: NEGATIVE
Comment: NEGATIVE
Comment: NORMAL
Neisseria Gonorrhea: NEGATIVE
Trichomonas: NEGATIVE

## 2020-02-08 LAB — HEPATITIS C ANTIBODY: Hep C Virus Ab: <0.1 s/co ratio (ref 0.0–0.9)

## 2020-02-08 LAB — HEPATITIS B SURFACE ANTIGEN: Hepatitis B Surface Ag: NEGATIVE

## 2020-02-08 LAB — RPR: RPR Ser Ql: NONREACTIVE

## 2020-02-08 MED ORDER — FLUCONAZOLE 150 MG PO TABS: 150.0000 mg | ORAL_TABLET | Freq: Once | ORAL | 0 refills | Status: AC

## 2020-05-01 ENCOUNTER — Ambulatory Visit: Payer: Medicaid Other

## 2020-05-31 ENCOUNTER — Ambulatory Visit: Payer: Medicaid Other

## 2020-10-11 ENCOUNTER — Ambulatory Visit (INDEPENDENT_AMBULATORY_CARE_PROVIDER_SITE_OTHER): Payer: Medicaid Other | Admitting: Obstetrics

## 2020-10-11 ENCOUNTER — Other Ambulatory Visit: Payer: Self-pay

## 2020-10-11 ENCOUNTER — Other Ambulatory Visit (HOSPITAL_COMMUNITY)
Admission: RE | Admit: 2020-10-11 | Discharge: 2020-10-11 | Disposition: A | Discharge status: Home / Self Care | Payer: Medicaid Other | Source: Ambulatory Visit | Attending: Obstetrics | Admitting: Obstetrics

## 2020-10-11 ENCOUNTER — Encounter: Payer: Self-pay | Admitting: Obstetrics

## 2020-10-11 VITALS — BP 124/76 | HR 90 | Ht 65.0 in | Wt 155.8 lb

## 2020-10-11 DIAGNOSIS — N898 Other specified noninflammatory disorders of vagina: Secondary | ICD-10-CM

## 2020-10-11 DIAGNOSIS — Z3009 Encounter for other general counseling and advice on contraception: Secondary | ICD-10-CM

## 2020-10-11 DIAGNOSIS — Z01419 Encounter for gynecological examination (general) (routine) without abnormal findings: Secondary | ICD-10-CM | POA: Insufficient documentation

## 2020-10-11 DIAGNOSIS — N946 Dysmenorrhea, unspecified: Secondary | ICD-10-CM

## 2020-10-11 DIAGNOSIS — B9689 Other specified bacterial agents as the cause of diseases classified elsewhere: Secondary | ICD-10-CM

## 2020-10-11 DIAGNOSIS — N760 Acute vaginitis: Secondary | ICD-10-CM

## 2020-10-11 DIAGNOSIS — Z113 Encounter for screening for infections with a predominantly sexual mode of transmission: Secondary | ICD-10-CM | POA: Diagnosis not present

## 2020-10-11 LAB — POCT URINE PREGNANCY: Preg Test, Ur: NEGATIVE

## 2020-10-11 MED ORDER — METRONIDAZOLE 500 MG PO TABS: 500.0000 mg | ORAL_TABLET | Freq: Two times a day (BID) | ORAL | 2 refills | Status: DC

## 2020-10-11 MED ORDER — IBUPROFEN 800 MG PO TABS: 800.0000 mg | ORAL_TABLET | Freq: Three times a day (TID) | ORAL | 5 refills | Status: DC | PRN

## 2020-10-11 NOTE — Progress Notes (Signed)
Pt is in the office for annual Last pap 10-16-16 Pt states that she stopped depo in June and recently had unprotected sex, pt reports last cycle 09-17-20 was only 2 days and she took a plan B. UPT is negative today, advised pt if cycle does not come on to retest in a few weeks, pt agreed.

## 2020-10-11 NOTE — Progress Notes (Signed)
Subjective:        Darlene Morgan is a 24 y.o. female here for a routine exam.  Current complaints: Vaginal discharge with odor..    Personal health questionnaire:  Is patient [name], have a family history of breast and/or ovarian cancer: no Is there a family history of uterine cancer diagnosed at age < 16, gastrointestinal cancer, urinary tract cancer, family member who is a Personnel officer syndrome-associated carrier: no Is the patient overweight and hypertensive, family history of diabetes, personal history of gestational diabetes, preeclampsia or PCOS: no Is patient over 11, have PCOS,  family history of premature CHD under age 60, diabetes, smoke, have hypertension or peripheral artery disease:  no At any time, has a partner hit, kicked or otherwise hurt or frightened you?: no Over the past 2 weeks, have you felt down, depressed or hopeless?: no Over the past 2 weeks, have you felt little interest or pleasure in doing things?:no   Gynecologic History Patient's last menstrual period was 09/17/2020. Contraception: condoms Last Pap: 10-16-2016. Results were: normal Last mammogram: n/a. Results were: n/a  Obstetric History OB History  Gravida Para Term Preterm AB Living  3 1 1  0 2 1  SAB TAB Ectopic Multiple Live Births  0 2 0 0 1    # Outcome Date GA Lbr Len/2nd Weight Sex Delivery Anes PTL Lv  3 TAB 12/01/18          2 TAB 07/01/18 [redacted]w[redacted]d         1 Term 09/05/16 [redacted]w[redacted]d 08:54 / 01:20 7 lb 0.2 oz (3.181 kg) F Vag-Spont EPI  LIV    Past Medical History:  Diagnosis Date  . Asthma   . Heart murmur    pt states small   . Supervision of normal first pregnancy, antepartum 01/25/2016    Clinic [facility] Prenatal Labs Dating LMP = 17 wk u/s Blood type: O/POS/-- (02/24 1537)  Genetic Screen 1 Screen:    AFP:     Quad:     NIPS: Antibody:NEG (02/24 1537) Anatomic 04-07-1997 WNL female Rubella: 2.05 (02/24 1537) GTT Third trimester: 77/63/69 RPR: Non Reactive (07/05 0840)  Flu vaccine  HBsAg:  NEGATIVE (02/24 1537)  TDaP vaccine                                               HIV: Non Reactive (07/05 0840)  Baby Food               Breast                                GBS: (For PCN allergy, check sensitivities) Contraception  Depo injections Pap: Circumcision NA  Pediatrician  [facility] [place]  Support Person        Past Surgical History:  Procedure Laterality Date  . TONSILLECTOMY       Current Outpatient Medications:  .  albuterol (PROVENTIL HFA;VENTOLIN HFA) 108 (90 Base) MCG/ACT inhaler, Inhale 2 puffs into the lungs every 6 (six) hours as needed for wheezing or shortness of breath. (Patient not taking: Reported on 10/11/2020), Disp: , Rfl:  .  ibuprofen (ADVIL) 800 MG tablet, Take 1 tablet (800 mg total) by mouth every 8 (eight) hours as needed., Disp: 30 tablet, Rfl: 5 .  medroxyPROGESTERone (DEPO-PROVERA) 150 MG/ML injection,  Inject 1 mL (150 mg total) into the muscle every 3 (three) months. (Patient not taking: Reported on 10/11/2020), Disp: 1 mL, Rfl: 4 .  metroNIDAZOLE (FLAGYL) 500 MG tablet, Take 1 tablet (500 mg total) by mouth 2 (two) times daily., Disp: 14 tablet, Rfl: 2 Allergies  Allergen Reactions  . Hydrocodone Itching    Social History   Tobacco Use  . Smoking status: Former Smoker    Types: Cigarettes  . Smokeless tobacco: Never Used  Substance Use Topics  . Alcohol use: Yes    Alcohol/week: 0.0 standard drinks    Family History  Problem Relation Age of Onset  . Hypertension Other   . Hypertension Maternal Grandmother   . Thyroid disease Maternal Grandmother   . Thyroid disease Mother       Review of Systems  Constitutional: negative for fatigue and weight loss Respiratory: negative for cough and wheezing Cardiovascular: negative for chest pain, fatigue and palpitations Gastrointestinal: negative for abdominal pain and change in bowel habits Musculoskeletal:negative for myalgias Neurological: negative for gait problems and  tremors Behavioral/Psych: negative for abusive relationship, depression Endocrine: negative for temperature intolerance    Genitourinary:negative for abnormal menstrual periods, genital lesions, hot flashes, sexual problems.  Positive for  vaginal discharge with odor Integument/breast: negative for breast lump, breast tenderness, nipple discharge and skin lesion(s)    Objective:       BP 124/76   Pulse 90   Ht 5\' 5"  (1.651 m)   Wt 155 lb 12.8 oz (70.7 kg)   LMP 09/17/2020   Breastfeeding No   BMI 25.93 kg/m  General:   alert and no distress  Skin:   no rash or abnormalities  Lungs:   clear to auscultation bilaterally  Heart:   regular rate and rhythm, S1, S2 normal, no murmur, click, rub or gallop  Breasts:   normal without suspicious masses, skin or nipple changes or axillary nodes  Abdomen:  normal findings: no organomegaly, soft, non-tender and no hernia  Pelvis:  External genitalia: normal general appearance Urinary system: urethral meatus normal and bladder without fullness, nontender Vaginal: normal without tenderness, induration or masses Cervix: normal appearance Adnexa: normal bimanual exam Uterus: anteverted and non-tender, normal size   Lab Review Urine pregnancy test Labs reviewed yes Radiologic studies reviewed no  50% of 20 min visit spent on counseling and coordination of care.   Assessment:     1. Encounter for gynecological examination with Papanicolaou smear of cervix Rx: - Cytology - PAP( [place]) - POCT urine pregnancy  2. Dysmenorrhea Rx: - ibuprofen (ADVIL) 800 MG tablet; Take 1 tablet (800 mg total) by mouth every 8 (eight) hours as needed.  Dispense: 30 tablet; Refill: 5  3. Vaginal discharge Rx: - Cervicovaginal ancillary only( [place])  4. Screen for STD (sexually transmitted disease) Rx: - HIV antibody (with reflex) - RPR - Hepatitis C Antibody - Hepatitis B Surface AntiGEN  5. BV (bacterial vaginosis) Rx: -  metroNIDAZOLE (FLAGYL) 500 MG tablet; Take 1 tablet (500 mg total) by mouth 2 (two) times daily.  Dispense: 14 tablet; Refill: 2  6. Encounter for other general counseling and advice on contraception - wants to use condoms only   Plan:    Education reviewed: calcium supplements, depression evaluation, low fat, low cholesterol diet, safe sex/STD prevention, self breast exams and weight bearing exercise. Contraception: condoms. Follow up in: 1 year.   Meds ordered this encounter  Medications  . metroNIDAZOLE (FLAGYL) 500 MG tablet  Sig: Take 1 tablet (500 mg total) by mouth 2 (two) times daily.    Dispense:  14 tablet    Refill:  2  . ibuprofen (ADVIL) 800 MG tablet    Sig: Take 1 tablet (800 mg total) by mouth every 8 (eight) hours as needed.    Dispense:  30 tablet    Refill:  5   Orders Placed This Encounter  Procedures  . HIV antibody (with reflex)  . RPR  . Hepatitis C Antibody  . Hepatitis B Surface AntiGEN  . POCT urine pregnancy    Brock Bad, MD 10/11/2020 3:44 PM

## 2020-10-12 LAB — CERVICOVAGINAL ANCILLARY ONLY
Bacterial Vaginitis (gardnerella): POSITIVE — AB
Candida Glabrata: NEGATIVE
Candida Vaginitis: NEGATIVE
Chlamydia: NEGATIVE
Comment: NEGATIVE
Comment: NEGATIVE
Comment: NEGATIVE
Comment: NEGATIVE
Comment: NEGATIVE
Comment: NORMAL
Neisseria Gonorrhea: NEGATIVE
Trichomonas: NEGATIVE

## 2020-10-12 LAB — RPR: RPR Ser Ql: NONREACTIVE

## 2020-10-12 LAB — CYTOLOGY - PAP
Comment: NEGATIVE
Diagnosis: NEGATIVE
High risk HPV: NEGATIVE

## 2020-10-12 LAB — HEPATITIS C ANTIBODY: Hep C Virus Ab: <0.1 s/co ratio (ref 0.0–0.9)

## 2020-10-12 LAB — HEPATITIS B SURFACE ANTIGEN: Hepatitis B Surface Ag: NEGATIVE

## 2020-10-12 LAB — HIV ANTIBODY (ROUTINE TESTING W REFLEX): HIV Screen 4th Generation wRfx: NONREACTIVE

## 2020-11-12 ENCOUNTER — Telehealth: Payer: Self-pay

## 2020-11-12 NOTE — Telephone Encounter (Signed)
Returned call, pt stated that she went to urgent care yesterday and she is fine now.

## 2020-12-01 NOTE — L&D Delivery Note (Signed)
OB/GYN Faculty Practice Delivery Note  Darlene Morgan is a 25 y.o. [id number] s/p VD at [redacted]w[redacted]d. She was admitted for an eIOL.   ROM: 8h 18m with clear fluid GBS Status:  Negative/-- (10/17 1210) Maximum Maternal Temperature: 98.47F   Labor Progress: Initial SVE: 1.5/40/-3. She then progressed to complete with cytotec x2 and SROM.    Delivery Date/Time: 11/16, [phone]  Delivery: Called to room and patient was complete and pushing. Head delivered LOA. No nuchal cord present. Shoulder and body delivered in usual fashion. Infant with spontaneous cry, placed on mother's abdomen, dried and stimulated. Cord clamped x 2 after 1-minute delay, and cut by FOB. Cord blood drawn. Placenta delivered spontaneously with gentle cord traction. Fundus firm with massage and Pitocin. Manual lower uterine sweep performed with small clots retrieved. Labia, perineum, vagina, and cervix inspected with a hemostatic left labial tear that did not require repair.  Baby Weight: pending  Placenta: 3 vessel, intact. Sent to L&D Complications: None Lacerations: hemostatic left labial  EBL: 100 mL Analgesia: Epidural   Infant:  APGAR (1 MIN): 9   APGAR (5 MINS): 9    [name], DO  OB Family Medicine Fellow, [facility] for [facility] 10/16/2021, 7:13 AM

## 2021-02-12 ENCOUNTER — Other Ambulatory Visit: Payer: Self-pay

## 2021-02-12 ENCOUNTER — Ambulatory Visit (INDEPENDENT_AMBULATORY_CARE_PROVIDER_SITE_OTHER): Payer: Medicaid Other

## 2021-02-12 VITALS — BP 124/80 | HR 74 | Wt 151.0 lb

## 2021-02-12 DIAGNOSIS — Z3201 Encounter for pregnancy test, result positive: Secondary | ICD-10-CM | POA: Diagnosis not present

## 2021-02-12 LAB — POCT URINE PREGNANCY: Preg Test, Ur: POSITIVE — AB

## 2021-02-12 NOTE — Progress Notes (Signed)
Darlene Morgan presents today for UPT. She has no unusual complaints.  LMP:01/10/2021    OBJECTIVE: Appears well, in no apparent distress.  OB History    Gravida  4   Para  1   Term  1   Preterm  0   AB  2   Living  1     SAB  0   IAB  2   Ectopic  0   Multiple  0   Live Births  1          Home UPT Result: POSITIVE X3 In-Office UPT result: POSITIVE  I have reviewed the patient's medical, obstetrical, social, and family histories, and medications.   ASSESSMENT: Positive pregnancy test LMP  01/10/2021 EDD  10/17/2021 GA      [redacted]w[redacted]d  PLAN Prenatal care to be completed at: [facility]

## 2021-02-19 ENCOUNTER — Ambulatory Visit (INDEPENDENT_AMBULATORY_CARE_PROVIDER_SITE_OTHER): Payer: Medicaid Other

## 2021-02-19 ENCOUNTER — Other Ambulatory Visit: Payer: Self-pay

## 2021-02-19 VITALS — BP 104/76 | Ht 65.0 in | Wt 155.0 lb

## 2021-02-19 DIAGNOSIS — N898 Other specified noninflammatory disorders of vagina: Secondary | ICD-10-CM

## 2021-02-19 DIAGNOSIS — O26891 Other specified pregnancy related conditions, first trimester: Secondary | ICD-10-CM

## 2021-02-19 DIAGNOSIS — R300 Dysuria: Secondary | ICD-10-CM

## 2021-02-19 LAB — POCT URINALYSIS DIPSTICK
Bilirubin, UA: NEGATIVE
Glucose, UA: NEGATIVE
Ketones, UA: NEGATIVE
Nitrite, UA: NEGATIVE
Protein, UA: POSITIVE — AB
Spec Grav, UA: 1.015 (ref 1.010–1.025)
Urobilinogen, UA: 0.2 E.U./dL
pH, UA: 6 (ref 5.0–8.0)

## 2021-02-19 MED ORDER — METRONIDAZOLE 500 MG PO TABS: 500.0000 mg | ORAL_TABLET | Freq: Two times a day (BID) | ORAL | 0 refills | Status: DC

## 2021-02-19 MED ORDER — CEPHALEXIN 500 MG PO CAPS: 500.0000 mg | ORAL_CAPSULE | Freq: Four times a day (QID) | ORAL | 0 refills | Status: DC

## 2021-02-19 NOTE — Progress Notes (Signed)
SUBJECTIVE: Darlene Morgan is a 25 y.o. female who complains of urinary frequency, urgency and dysuria x 1 day, without flank pain, fever, chills. Pt states she has been having a discharge with odor for approximately 1 week.    OBJECTIVE: Appears well, in no apparent distress.  Vital signs are normal. Urine dipstick shows positive for WBC's and RBC's.    ASSESSMENT: Dysuria and vaginal discharge  PLAN: Treatment per orders.  Call or return to clinic prn if these symptoms worsen or fail to improve as anticipated.

## 2021-02-22 LAB — URINE CULTURE, OB REFLEX

## 2021-02-22 LAB — CULTURE, OB URINE

## 2021-02-27 ENCOUNTER — Other Ambulatory Visit: Payer: Self-pay | Admitting: Obstetrics and Gynecology

## 2021-02-27 ENCOUNTER — Encounter: Payer: Self-pay | Admitting: Obstetrics and Gynecology

## 2021-02-27 DIAGNOSIS — N390 Urinary tract infection, site not specified: Secondary | ICD-10-CM | POA: Insufficient documentation

## 2021-02-27 DIAGNOSIS — B9620 Unspecified Escherichia coli [E. coli] as the cause of diseases classified elsewhere: Secondary | ICD-10-CM | POA: Insufficient documentation

## 2021-02-27 MED ORDER — NITROFURANTOIN MONOHYD MACRO 100 MG PO CAPS: 100.0000 mg | ORAL_CAPSULE | Freq: Two times a day (BID) | ORAL | 0 refills | Status: DC

## 2021-03-21 ENCOUNTER — Ambulatory Visit (INDEPENDENT_AMBULATORY_CARE_PROVIDER_SITE_OTHER): Payer: Medicaid Other

## 2021-03-21 ENCOUNTER — Other Ambulatory Visit: Payer: Self-pay

## 2021-03-21 VITALS — BP 131/86 | HR 105 | Ht 65.0 in | Wt 151.5 lb

## 2021-03-21 DIAGNOSIS — Z3401 Encounter for supervision of normal first pregnancy, first trimester: Secondary | ICD-10-CM

## 2021-03-21 DIAGNOSIS — O219 Vomiting of pregnancy, unspecified: Secondary | ICD-10-CM

## 2021-03-21 DIAGNOSIS — Z789 Other specified health status: Secondary | ICD-10-CM

## 2021-03-21 DIAGNOSIS — B9620 Unspecified Escherichia coli [E. coli] as the cause of diseases classified elsewhere: Secondary | ICD-10-CM

## 2021-03-21 DIAGNOSIS — Z3491 Encounter for supervision of normal pregnancy, unspecified, first trimester: Secondary | ICD-10-CM | POA: Insufficient documentation

## 2021-03-21 DIAGNOSIS — N390 Urinary tract infection, site not specified: Secondary | ICD-10-CM

## 2021-03-21 DIAGNOSIS — Z3A10 10 weeks gestation of pregnancy: Secondary | ICD-10-CM

## 2021-03-21 DIAGNOSIS — Z3480 Encounter for supervision of other normal pregnancy, unspecified trimester: Secondary | ICD-10-CM | POA: Insufficient documentation

## 2021-03-21 DIAGNOSIS — O3680X0 Pregnancy with inconclusive fetal viability, not applicable or unspecified: Secondary | ICD-10-CM

## 2021-03-21 DIAGNOSIS — Z3481 Encounter for supervision of other normal pregnancy, first trimester: Secondary | ICD-10-CM

## 2021-03-21 MED ORDER — PROMETHAZINE HCL 25 MG PO TABS: 25.0000 mg | ORAL_TABLET | Freq: Four times a day (QID) | ORAL | 2 refills | Status: DC | PRN

## 2021-03-21 MED ORDER — BLOOD PRESSURE KIT DEVI: 1.0000 | 0 refills | Status: DC

## 2021-03-21 NOTE — Progress Notes (Signed)
PRENATAL INTAKE SUMMARY  Darlene Morgan presents today New OB Nurse Interview.  OB History    Gravida  4   Para  1   Term  1   Preterm  0   AB  2   Living  1     SAB  0   IAB  2   Ectopic  0   Multiple  0   Live Births  1          I have reviewed the patient's medical, obstetrical, social, and family histories, medications, and available lab results.  SUBJECTIVE She complains of having nausea and vomiting. Phenergan sent to pharmacy per protocol.  OBJECTIVE Initial Physical Exam (New OB)  GENERAL APPEARANCE: alert, well appearing   ASSESSMENT Normal pregnancy  PLAN Prenatal care to be completed at [place] OB labs to be completed at [facility] provider visit Baby Scripts ordered Blood pressure kit: Patient has one at home U/S performed today reveals single live IUP at [id number] [place] FHR 165 PHQ 2-9 score: 0 GAD 7 score: 4

## 2021-03-26 ENCOUNTER — Other Ambulatory Visit: Payer: Self-pay

## 2021-03-26 ENCOUNTER — Other Ambulatory Visit (HOSPITAL_COMMUNITY)
Admission: RE | Admit: 2021-03-26 | Discharge: 2021-03-26 | Disposition: A | Discharge status: Home / Self Care | Payer: Medicaid Other | Source: Ambulatory Visit | Attending: Obstetrics and Gynecology | Admitting: Obstetrics and Gynecology

## 2021-03-26 ENCOUNTER — Encounter: Payer: Self-pay | Admitting: Obstetrics and Gynecology

## 2021-03-26 ENCOUNTER — Ambulatory Visit (INDEPENDENT_AMBULATORY_CARE_PROVIDER_SITE_OTHER): Payer: Medicaid Other | Admitting: Obstetrics and Gynecology

## 2021-03-26 VITALS — BP 124/77 | HR 86 | Wt 152.0 lb

## 2021-03-26 DIAGNOSIS — Z3A10 10 weeks gestation of pregnancy: Secondary | ICD-10-CM | POA: Diagnosis not present

## 2021-03-26 DIAGNOSIS — Z3481 Encounter for supervision of other normal pregnancy, first trimester: Secondary | ICD-10-CM | POA: Insufficient documentation

## 2021-03-26 MED ORDER — PREPLUS 27-1 MG PO TABS: 1.0000 | ORAL_TABLET | Freq: Every day | ORAL | 13 refills | Status: DC

## 2021-03-26 MED ORDER — DOXYLAMINE-PYRIDOXINE 10-10 MG PO TBEC: 2.0000 | DELAYED_RELEASE_TABLET | Freq: Every day | ORAL | 5 refills | Status: DC

## 2021-03-26 NOTE — Progress Notes (Signed)
Subjective:    Leighanne Adolph is a [id number] [redacted]w[redacted]d being seen today for her first obstetrical visit.  Her obstetrical history is significant for normal first pregnancy. Patient with history of mild hyperthyroidism followed by endocrinologist. Patient never required medication or surgical interventions. Patient does intend to breast feed. Pregnancy history fully reviewed.  Patient reports no complaints.  Vitals:   03/26/21 1007  BP: 124/77  Pulse: 86  Weight: 152 lb (68.9 kg)    HISTORY: OB History  Gravida Para Term Preterm AB Living  4 1 1  0 2 1  SAB IAB Ectopic Multiple Live Births  0 2 0 0 1    # Outcome Date GA Lbr Len/2nd Weight Sex Delivery Anes PTL Lv  4 Current           3 IAB 12/01/18          2 IAB 07/01/18 110w5d         1 Term 09/05/16 [redacted]w[redacted]d 08:54 / 01:20 7 lb 0.2 oz (3.181 kg) F Vag-Spont EPI  LIV   Past Medical History:  Diagnosis Date  . Asthma   . Heart murmur    pt states small   . Supervision of normal first pregnancy, antepartum 01/25/2016    Clinic [facility] Prenatal Labs Dating LMP = 17 wk u/s Blood type: O/POS/-- (02/24 1537)  Genetic Screen 1 Screen:    AFP:     Quad:     NIPS: Antibody:NEG (02/24 1537) Anatomic 04-07-1997 WNL female Rubella: 2.05 (02/24 1537) GTT Third trimester: 77/63/69 RPR: Non Reactive (07/05 0840)  Flu vaccine  HBsAg: NEGATIVE (02/24 1537)  TDaP vaccine                                               HIV: Non Reactive (07/05 0840)  Baby Food               Breast                                GBS: (For PCN allergy, check sensitivities) Contraception  Depo injections Pap: Circumcision NA  Pediatrician  [facility] [place]  Support Person       Past Surgical History:  Procedure Laterality Date  . TONSILLECTOMY     Family History  Problem Relation Age of Onset  . Hypertension Other   . Hypertension Maternal Grandmother   . Thyroid disease Maternal Grandmother   . Thyroid disease Mother   . Heart attack Father      Exam    Uterus:      Pelvic Exam:    Perineum: Normal Perineum   Vulva: normal   Vagina:  normal mucosa, normal discharge   pH:    Cervix: multiparous appearance and closed and long   Adnexa: normal adnexa and no mass, fullness, tenderness   Bony Pelvis: gynecoid  System: Breast:  normal appearance, no masses or tenderness   Skin: normal coloration and turgor, no rashes    Neurologic: normal, no focal deficits   Extremities: normal strength, tone, and muscle mass   HEENT extra ocular movement intact   Mouth/Teeth mucous membranes moist, pharynx normal without lesions and dental hygiene good   Neck supple and no masses   Cardiovascular: regular rate and rhythm   Respiratory:  appears well,  vitals normal, no respiratory distress, acyanotic, normal RR, chest clear, no wheezing, crepitations, rhonchi, normal symmetric air entry   Abdomen: soft, non-tender; bowel sounds normal; no masses,  no organomegaly   Urinary:       Assessment:    Pregnancy: [id number] Patient Active Problem List   Diagnosis Date Noted  . Encounter for supervision of normal pregnancy in first trimester 03/21/2021  . E. coli UTI 02/27/2021  . Hyperthyroidism 02/08/2018  . Post term pregnancy at [redacted] weeks gestation 09/04/2016  . Encounter for trial of labor 09/04/2016  . Supervision of normal first pregnancy, antepartum 01/25/2016        Plan:     Initial labs drawn. Prenatal vitamins. Problem list reviewed and updated. Genetic Screening discussed : Panorama ordered.  Ultrasound discussed; fetal survey: ordered.  Follow up in 4 weeks. 50% of 30 min visit spent on counseling and coordination of care.     Cressie Betzler 03/26/2021

## 2021-03-26 NOTE — Patient Instructions (Signed)
 Obstetrics: Normal and Problem Pregnancies (7th ed., pp. 102-121). [place], [place]: [employer]."> Textbook of Family Medicine (9th ed., pp. 365-410). [place], [place]: [employer].">  First Trimester of Pregnancy  The first trimester of pregnancy starts on the first day of your last menstrual period until the end of week 12. This is months 1 through 3 of pregnancy. A week after a sperm fertilizes an egg, the egg will implant into the wall of the uterus and begin to develop into a baby. By the end of 12 weeks, all the baby's organs will be formed and the baby will be 2-3 inches in size. Body changes during your first trimester Your body goes through many changes during pregnancy. The changes vary and generally return to normal after your baby is born. Physical changes  You may gain or lose weight.  Your breasts may begin to grow larger and become tender. The tissue that surrounds your nipples (areola) may become darker.  Dark spots or blotches (chloasma or mask of pregnancy) may develop on your face.  You may have changes in your hair. These can include thickening or thinning of your hair or changes in texture. Health changes  You may feel nauseous, and you may vomit.  You may have heartburn.  You may develop headaches.  You may develop constipation.  Your gums may bleed and may be sensitive to brushing and flossing. Other changes  You may tire easily.  You may urinate more often.  Your menstrual periods will stop.  You may have a loss of appetite.  You may develop cravings for certain kinds of food.  You may have changes in your emotions from day to day.  You may have more vivid and strange dreams. Follow these instructions at home: Medicines  Follow your health care provider's instructions regarding medicine use. Specific medicines may be either safe or unsafe to take during pregnancy. Do not take any medicines unless told to by your health care provider.  Take  a prenatal vitamin that contains at least 600 micrograms (mcg) of folic acid. Eating and drinking  Eat a healthy diet that includes fresh fruits and vegetables, whole grains, good sources of protein such as meat, eggs, or tofu, and low-fat dairy products.  Avoid raw meat and unpasteurized juice, milk, and cheese. These carry germs that can harm you and your baby.  If you feel nauseous or you vomit: ? Eat 4 or 5 small meals a day instead of 3 large meals. ? Try eating a few soda crackers. ? Drink liquids between meals instead of during meals.  You may need to take these actions to prevent or treat constipation: ? Drink enough fluid to keep your urine pale yellow. ? Eat foods that are high in fiber, such as beans, whole grains, and fresh fruits and vegetables. ? Limit foods that are high in fat and processed sugars, such as fried or sweet foods. Activity  Exercise only as directed by your health care provider. Most people can continue their usual exercise routine during pregnancy. Try to exercise for 30 minutes at least 5 days a week.  Stop exercising if you develop pain or cramping in the lower abdomen or lower back.  Avoid exercising if it is very hot or humid or if you are at high altitude.  Avoid heavy lifting.  If you choose to, you may have sex unless your health care provider tells you not to. Relieving pain and discomfort  Wear a good support bra to relieve   breast tenderness.  Rest with your legs elevated if you have leg cramps or low back pain.  If you develop bulging veins (varicose veins) in your legs: ? Wear support hose as told by your health care provider. ? Elevate your feet for 15 minutes, 3-4 times a day. ? Limit salt in your diet. Safety  Wear your seat belt at all times when driving or riding in a car.  Talk with your health care provider if someone is verbally or physically abusive to you.  Talk with your health care provider if you are feeling sad or have  thoughts of hurting yourself. Lifestyle  Do not use hot tubs, steam rooms, or saunas.  Do not douche. Do not use tampons or scented sanitary pads.  Do not use herbal remedies, alcohol, illegal drugs, or medicines that are not approved by your health care provider. Chemicals in these products can harm your baby.  Do not use any products that contain nicotine or tobacco, such as cigarettes, e-cigarettes, and chewing tobacco. If you need help quitting, ask your health care provider.  Avoid cat litter boxes and soil used by cats. These carry germs that can cause birth defects in the baby and possibly loss of the unborn baby (fetus) by miscarriage or stillbirth. General instructions  During routine prenatal visits in the first trimester, your health care provider will do a physical exam, perform necessary tests, and ask you how things are going. Keep all follow-up visits. This is important.  Ask for help if you have counseling or nutritional needs during pregnancy. Your health care provider can offer advice or refer you to specialists for help with various needs.  Schedule a dentist appointment. At home, brush your teeth with a soft toothbrush. Floss gently.  Write down your questions. Take them to your prenatal visits. Where to find more information  American Pregnancy Association: americanpregnancy.org  American College of Obstetricians and Gynecologists: acog.org/en/Womens%20Health/Pregnancy  Office on Women's Health: womenshealth.gov/pregnancy Contact a health care provider if you have:  Dizziness.  A fever.  Mild pelvic cramps, pelvic pressure, or nagging pain in the abdominal area.  Nausea, vomiting, or diarrhea that lasts for 24 hours or longer.  A bad-smelling vaginal discharge.  Pain when you urinate.  Known exposure to a contagious illness, such as chickenpox, measles, Zika virus, HIV, or hepatitis. Get help right away if you have:  Spotting or bleeding from your  vagina.  Severe abdominal cramping or pain.  Shortness of breath or chest pain.  Any kind of trauma, such as from a fall or a car crash.  New or increased pain, swelling, or redness in an arm or leg. Summary  The first trimester of pregnancy starts on the first day of your last menstrual period until the end of week 12 (months 1 through 3).  Eating 4 or 5 small meals a day rather than 3 large meals may help to relieve nausea and vomiting.  Do not use any products that contain nicotine or tobacco, such as cigarettes, e-cigarettes, and chewing tobacco. If you need help quitting, ask your health care provider.  Keep all follow-up visits. This is important. This information is not intended to replace advice given to you by your health care provider. Make sure you discuss any questions you have with your health care provider. Document Revised: 04/25/2020 Document Reviewed: 03/01/2020 [employer] Patient Education  2021 [employer]   Second Trimester of Pregnancy  The second trimester of pregnancy is from week 13 through week   27. This is months 4 through 6 of pregnancy. The second trimester is often a time when you feel your best. Your body has adjusted to being pregnant, and you begin to feel better physically. During the second trimester:  Morning sickness has lessened or stopped completely.  You may have more energy.  You may have an increase in appetite. The second trimester is also a time when the unborn baby (fetus) is growing rapidly. At the end of the sixth month, the fetus may be up to 12 inches long and weigh about 1 pounds. You will likely begin to feel the baby move (quickening) between 16 and 20 weeks of pregnancy. Body changes during your second trimester Your body continues to go through many changes during your second trimester. The changes vary and generally return to normal after the baby is born. Physical changes  Your weight will continue to increase. You will  notice your lower abdomen bulging out.  You may begin to get stretch marks on your hips, abdomen, and breasts.  Your breasts will continue to grow and to become tender.  Dark spots or blotches (chloasma or mask of pregnancy) may develop on your face.  A dark line from your belly button to the pubic area (linea nigra) may appear.  You may have changes in your hair. These can include thickening of your hair, rapid growth, and changes in texture. Some people also have hair loss during or after pregnancy, or hair that feels dry or thin. Health changes  You may develop headaches.  You may have heartburn.  You may develop constipation.  You may develop hemorrhoids or swollen, bulging veins (varicose veins).  Your gums may bleed and may be sensitive to brushing and flossing.  You may urinate more often because the fetus is pressing on your bladder.  You may have back pain. This is caused by: ? Weight gain. ? Pregnancy hormones that are relaxing the joints in your pelvis. ? A shift in weight and the muscles that support your balance. Follow these instructions at home: Medicines  Follow your health care provider's instructions regarding medicine use. Specific medicines may be either safe or unsafe to take during pregnancy. Do not take any medicines unless approved by your health care provider.  Take a prenatal vitamin that contains at least 600 micrograms (mcg) of folic acid. Eating and drinking  Eat a healthy diet that includes fresh fruits and vegetables, whole grains, good sources of protein such as meat, eggs, or tofu, and low-fat dairy products.  Avoid raw meat and unpasteurized juice, milk, and cheese. These carry germs that can harm you and your baby.  You may need to take these actions to prevent or treat constipation: ? Drink enough fluid to keep your urine pale yellow. ? Eat foods that are high in fiber, such as beans, whole grains, and fresh fruits and  vegetables. ? Limit foods that are high in fat and processed sugars, such as fried or sweet foods. Activity  Exercise only as directed by your health care provider. Most people can continue their usual exercise routine during pregnancy. Try to exercise for 30 minutes at least 5 days a week. Stop exercising if you develop contractions in your uterus.  Stop exercising if you develop pain or cramping in the lower abdomen or lower back.  Avoid exercising if it is very hot or humid or if you are at a high altitude.  Avoid heavy lifting.  If you choose to, you may have   sex unless your health care provider tells you not to. Relieving pain and discomfort  Wear a supportive bra to prevent discomfort from breast tenderness.  Take warm sitz baths to soothe any pain or discomfort caused by hemorrhoids. Use hemorrhoid cream if your health care provider approves.  Rest with your legs raised (elevated) if you have leg cramps or low back pain.  If you develop varicose veins: ? Wear support hose as told by your health care provider. ? Elevate your feet for 15 minutes, 3-4 times a day. ? Limit salt in your diet. Safety  Wear your seat belt at all times when driving or riding in a car.  Talk with your health care provider if someone is verbally or physically abusive to you. Lifestyle  Do not use hot tubs, steam rooms, or saunas.  Do not douche. Do not use tampons or scented sanitary pads.  Avoid cat litter boxes and soil used by cats. These carry germs that can cause birth defects in the baby and possibly loss of the fetus by miscarriage or stillbirth.  Do not use herbal remedies, alcohol, illegal drugs, or medicines that are not approved by your health care provider. Chemicals in these products can harm your baby.  Do not use any products that contain nicotine or tobacco, such as cigarettes, e-cigarettes, and chewing tobacco. If you need help quitting, ask your health care provider. General  instructions  During a routine prenatal visit, your health care provider will do a physical exam and other tests. He or she will also discuss your overall health. Keep all follow-up visits. This is important.  Ask your health care provider for a referral to a local prenatal education class.  Ask for help if you have counseling or nutritional needs during pregnancy. Your health care provider can offer advice or refer you to specialists for help with various needs. Where to find more information  American Pregnancy Association: americanpregnancy.org  American College of Obstetricians and Gynecologists: acog.org/en/Womens%20Health/Pregnancy  Office on Women's Health: womenshealth.gov/pregnancy Contact a health care provider if you have:  A headache that does not go away when you take medicine.  Vision changes or you see spots in front of your eyes.  Mild pelvic cramps, pelvic pressure, or nagging pain in the abdominal area.  Persistent nausea, vomiting, or diarrhea.  A bad-smelling vaginal discharge or foul-smelling urine.  Pain when you urinate.  Sudden or extreme swelling of your face, hands, ankles, feet, or legs.  A fever. Get help right away if you:  Have fluid leaking from your vagina.  Have spotting or bleeding from your vagina.  Have severe abdominal cramping or pain.  Have difficulty breathing.  Have chest pain.  Have fainting spells.  Have not felt your baby move for the time period told by your health care provider.  Have new or increased pain, swelling, or redness in an arm or leg. Summary  The second trimester of pregnancy is from week 13 through week 27 (months 4 through 6).  Do not use herbal remedies, alcohol, illegal drugs, or medicines that are not approved by your health care provider. Chemicals in these products can harm your baby.  Exercise only as directed by your health care provider. Most people can continue their usual exercise routine  during pregnancy.  Keep all follow-up visits. This is important. This information is not intended to replace advice given to you by your health care provider. Make sure you discuss any questions you have with your health care   provider. Document Revised: 04/25/2020 Document Reviewed: 03/01/2020 [employer] Patient Education  2021 [employer]   Contraception Choices Contraception, also called birth control, refers to methods or devices that prevent pregnancy. Hormonal methods Contraceptive implant A contraceptive implant is a thin, plastic tube that contains a hormone that prevents pregnancy. It is different from an intrauterine device (IUD). It is inserted into the upper part of the arm by a health care provider. Implants can be effective for up to 3 years. Progestin-only injections Progestin-only injections are injections of progestin, a synthetic form of the hormone progesterone. They are given every 3 months by a health care provider. Birth control pills Birth control pills are pills that contain hormones that prevent pregnancy. They must be taken once a day, preferably at the same time each day. A prescription is needed to use this method of contraception. Birth control patch The birth control patch contains hormones that prevent pregnancy. It is placed on the skin and must be changed once a week for three weeks and removed on the fourth week. A prescription is needed to use this method of contraception. Vaginal ring A vaginal ring contains hormones that prevent pregnancy. It is placed in the vagina for three weeks and removed on the fourth week. After that, the process is repeated with a new ring. A prescription is needed to use this method of contraception. Emergency contraceptive Emergency contraceptives prevent pregnancy after unprotected sex. They come in pill form and can be taken up to 5 days after sex. They work best the sooner they are taken after having sex. Most emergency  contraceptives are available without a prescription. This method should not be used as your only form of birth control.   Barrier methods Female condom A female condom is a thin sheath that is worn over the penis during sex. Condoms keep sperm from going inside a woman's body. They can be used with a sperm-killing substance (spermicide) to increase their effectiveness. They should be thrown away after one use. Female condom A female condom is a soft, loose-fitting sheath that is put into the vagina before sex. The condom keeps sperm from going inside a woman's body. They should be thrown away after one use. Diaphragm A diaphragm is a soft, dome-shaped barrier. It is inserted into the vagina before sex, along with a spermicide. The diaphragm blocks sperm from entering the uterus, and the spermicide kills sperm. A diaphragm should be left in the vagina for 6-8 hours after sex and removed within 24 hours. A diaphragm is prescribed and fitted by a health care provider. A diaphragm should be replaced every 1-2 years, after giving birth, after gaining more than 15 lb (6.8 kg), and after pelvic surgery. Cervical cap A cervical cap is a round, soft latex or plastic cup that fits over the cervix. It is inserted into the vagina before sex, along with spermicide. It blocks sperm from entering the uterus. The cap should be left in place for 6-8 hours after sex and removed within 48 hours. A cervical cap must be prescribed and fitted by a health care provider. It should be replaced every 2 years. Sponge A sponge is a soft, circular piece of polyurethane foam with spermicide in it. The sponge helps block sperm from entering the uterus, and the spermicide kills sperm. To use it, you make it wet and then insert it into the vagina. It should be inserted before sex, left in for at least 6 hours after sex, and removed and thrown away   within 30 hours. Spermicides Spermicides are chemicals that kill or block sperm from  entering the cervix and uterus. They can come as a cream, jelly, suppository, foam, or tablet. A spermicide should be inserted into the vagina with an applicator at least 10-15 minutes before sex to allow time for it to work. The process must be repeated every time you have sex. Spermicides do not require a prescription.   Intrauterine contraception Intrauterine device (IUD) An IUD is a T-shaped device that is put in a woman's uterus. There are two types:  Hormone IUD.This type contains progestin, a synthetic form of the hormone progesterone. This type can stay in place for 3-5 years.  Copper IUD.This type is wrapped in copper wire. It can stay in place for 10 years. Permanent methods of contraception Female tubal ligation In this method, a woman's fallopian tubes are sealed, tied, or blocked during surgery to prevent eggs from traveling to the uterus. Hysteroscopic sterilization In this method, a small, flexible insert is placed into each fallopian tube. The inserts cause scar tissue to form in the fallopian tubes and block them, so sperm cannot reach an egg. The procedure takes about 3 months to be effective. Another form of birth control must be used during those 3 months. Female sterilization This is a procedure to tie off the tubes that carry sperm (vasectomy). After the procedure, the man can still ejaculate fluid (semen). Another form of birth control must be used for 3 months after the procedure. Natural planning methods Natural family planning In this method, a couple does not have sex on days when the woman could become pregnant. Calendar method In this method, the woman keeps track of the length of each menstrual cycle, identifies the days when pregnancy can happen, and does not have sex on those days. Ovulation method In this method, a couple avoids sex during ovulation. Symptothermal method This method involves not having sex during ovulation. The woman typically checks for  ovulation by watching changes in her temperature and in the consistency of cervical mucus. Post-ovulation method In this method, a couple waits to have sex until after ovulation. Where to find more information  Centers for Disease Control and Prevention: www.cdc.gov Summary  Contraception, also called birth control, refers to methods or devices that prevent pregnancy.  Hormonal methods of contraception include implants, injections, pills, patches, vaginal rings, and emergency contraceptives.  Barrier methods of contraception can include female condoms, female condoms, diaphragms, cervical caps, sponges, and spermicides.  There are two types of IUDs (intrauterine devices). An IUD can be put in a woman's uterus to prevent pregnancy for 3-5 years.  Permanent sterilization can be done through a procedure for males and females. Natural family planning methods involve nothaving sex on days when the woman could become pregnant. This information is not intended to replace advice given to you by your health care provider. Make sure you discuss any questions you have with your health care provider. Document Revised: 04/23/2020 Document Reviewed: 04/23/2020 Elsevier Patient Education  2021 [employer].   Breastfeeding  Choosing to breastfeed is one of the best decisions you can make for yourself and your baby. A change in hormones during pregnancy causes your breasts to make breast milk in your milk-producing glands. Hormones prevent breast milk from being released before your baby is born. They also prompt milk flow after birth. Once breastfeeding has begun, thoughts of your baby, as well as his or her sucking or crying, can stimulate the release of milk   from your milk-producing glands. Benefits of breastfeeding Research shows that breastfeeding offers many health benefits for infants and mothers. It also offers a cost-free and convenient way to feed your baby. For your baby  Your first milk  (colostrum) helps your baby's digestive system to function better.  Special cells in your milk (antibodies) help your baby to fight off infections.  Breastfed babies are less likely to develop asthma, allergies, obesity, or type 2 diabetes. They are also at lower risk for sudden infant death syndrome (SIDS).  Nutrients in breast milk are better able to meet your baby's needs compared to infant formula.  Breast milk improves your baby's brain development. For you  Breastfeeding helps to create a very special bond between you and your baby.  Breastfeeding is convenient. Breast milk costs nothing and is always available at the correct temperature.  Breastfeeding helps to burn calories. It helps you to lose the weight that you gained during pregnancy.  Breastfeeding makes your uterus return faster to its size before pregnancy. It also slows bleeding (lochia) after you give birth.  Breastfeeding helps to lower your risk of developing type 2 diabetes, osteoporosis, rheumatoid arthritis, cardiovascular disease, and breast, ovarian, uterine, and endometrial cancer later in life. Breastfeeding basics Starting breastfeeding  Find a comfortable place to sit or lie down, with your neck and back well-supported.  Place a pillow or a rolled-up blanket under your baby to bring him or her to the level of your breast (if you are seated). Nursing pillows are specially designed to help support your arms and your baby while you breastfeed.  Make sure that your baby's tummy (abdomen) is facing your abdomen.  Gently massage your breast. With your fingertips, massage from the outer edges of your breast inward toward the nipple. This encourages milk flow. If your milk flows slowly, you may need to continue this action during the feeding.  Support your breast with 4 fingers underneath and your thumb above your nipple (make the letter "C" with your hand). Make sure your fingers are well away from your nipple and  your baby's mouth.  Stroke your baby's lips gently with your finger or nipple.  When your baby's mouth is open wide enough, quickly bring your baby to your breast, placing your entire nipple and as much of the areola as possible into your baby's mouth. The areola is the colored area around your nipple. ? More areola should be visible above your baby's upper lip than below the lower lip. ? Your baby's lips should be opened and extended outward (flanged) to ensure an adequate, comfortable latch. ? Your baby's tongue should be between his or her lower gum and your breast.  Make sure that your baby's mouth is correctly positioned around your nipple (latched). Your baby's lips should create a seal on your breast and be turned out (everted).  It is common for your baby to suck about 2-3 minutes in order to start the flow of breast milk. Latching Teaching your baby how to latch onto your breast properly is very important. An improper latch can cause nipple pain, decreased milk supply, and poor weight gain in your baby. Also, if your baby is not latched onto your nipple properly, he or she may swallow some air during feeding. This can make your baby fussy. Burping your baby when you switch breasts during the feeding can help to get rid of the air. However, teaching your baby to latch on properly is still the best way to   prevent fussiness from swallowing air while breastfeeding. Signs that your baby has successfully latched onto your nipple  Silent tugging or silent sucking, without causing you pain. Infant's lips should be extended outward (flanged).  Swallowing heard between every 3-4 sucks once your milk has started to flow (after your let-down milk reflex occurs).  Muscle movement above and in front of his or her ears while sucking. Signs that your baby has not successfully latched onto your nipple  Sucking sounds or smacking sounds from your baby while breastfeeding.  Nipple pain. If you think  your baby has not latched on correctly, slip your finger into the corner of your baby's mouth to break the suction and place it between your baby's gums. Attempt to start breastfeeding again. Signs of successful breastfeeding Signs from your baby  Your baby will gradually decrease the number of sucks or will completely stop sucking.  Your baby will fall asleep.  Your baby's body will relax.  Your baby will retain a small amount of milk in his or her mouth.  Your baby will let go of your breast by himself or herself. Signs from you  Breasts that have increased in firmness, weight, and size 1-3 hours after feeding.  Breasts that are softer immediately after breastfeeding.  Increased milk volume, as well as a change in milk consistency and color by the fifth day of breastfeeding.  Nipples that are not sore, cracked, or bleeding. Signs that your baby is getting enough milk  Wetting at least 1-2 diapers during the first 24 hours after birth.  Wetting at least 5-6 diapers every 24 hours for the first week after birth. The urine should be clear or pale yellow by the age of 5 days.  Wetting 6-8 diapers every 24 hours as your baby continues to grow and develop.  At least 3 stools in a 24-hour period by the age of 5 days. The stool should be soft and yellow.  At least 3 stools in a 24-hour period by the age of 7 days. The stool should be seedy and yellow.  No loss of weight greater than 10% of birth weight during the first 3 days of life.  Average weight gain of 4-7 oz (113-198 g) per week after the age of 4 days.  Consistent daily weight gain by the age of 5 days, without weight loss after the age of 2 weeks. After a feeding, your baby may spit up a small amount of milk. This is normal. Breastfeeding frequency and duration Frequent feeding will help you make more milk and can prevent sore nipples and extremely full breasts (breast engorgement). Breastfeed when you feel the need to  reduce the fullness of your breasts or when your baby shows signs of hunger. This is called "breastfeeding on demand." Signs that your baby is hungry include:  Increased alertness, activity, or restlessness.  Movement of the head from side to side.  Opening of the mouth when the corner of the mouth or cheek is stroked (rooting).  Increased sucking sounds, smacking lips, cooing, sighing, or squeaking.  Hand-to-mouth movements and sucking on fingers or hands.  Fussing or crying. Avoid introducing a pacifier to your baby in the first 4-6 weeks after your baby is born. After this time, you may choose to use a pacifier. Research has shown that pacifier use during the first year of a baby's life decreases the risk of sudden infant death syndrome (SIDS). Allow your baby to feed on each breast as long as   he or she wants. When your baby unlatches or falls asleep while feeding from the first breast, offer the second breast. Because newborns are often sleepy in the first few weeks of life, you may need to awaken your baby to get him or her to feed. Breastfeeding times will vary from baby to baby. However, the following rules can serve as a guide to help you make sure that your baby is properly fed:  Newborns (babies 4 weeks of age or younger) may breastfeed every 1-3 hours.  Newborns should not go without breastfeeding for longer than 3 hours during the day or 5 hours during the night.  You should breastfeed your baby a minimum of 8 times in a 24-hour period. Breast milk pumping Pumping and storing breast milk allows you to make sure that your baby is exclusively fed your breast milk, even at times when you are unable to breastfeed. This is especially important if you go back to work while you are still breastfeeding, or if you are not able to be present during feedings. Your lactation consultant can help you find a method of pumping that works best for you and give you guidelines about how long it is  safe to store breast milk.      Caring for your breasts while you breastfeed Nipples can become dry, cracked, and sore while breastfeeding. The following recommendations can help keep your breasts moisturized and healthy:  Avoid using soap on your nipples.  Wear a supportive bra designed especially for nursing. Avoid wearing underwire-style bras or extremely tight bras (sports bras).  Air-dry your nipples for 3-4 minutes after each feeding.  Use only cotton bra pads to absorb leaked breast milk. Leaking of breast milk between feedings is normal.  Use lanolin on your nipples after breastfeeding. Lanolin helps to maintain your skin's normal moisture barrier. Pure lanolin is not harmful (not toxic) to your baby. You may also hand express a few drops of breast milk and gently massage that milk into your nipples and allow the milk to air-dry. In the first few weeks after giving birth, some women experience breast engorgement. Engorgement can make your breasts feel heavy, warm, and tender to the touch. Engorgement peaks within 3-5 days after you give birth. The following recommendations can help to ease engorgement:  Completely empty your breasts while breastfeeding or pumping. You may want to start by applying warm, moist heat (in the shower or with warm, water-soaked hand towels) just before feeding or pumping. This increases circulation and helps the milk flow. If your baby does not completely empty your breasts while breastfeeding, pump any extra milk after he or she is finished.  Apply ice packs to your breasts immediately after breastfeeding or pumping, unless this is too uncomfortable for you. To do this: ? Put ice in a plastic bag. ? Place a towel between your skin and the bag. ? Leave the ice on for 20 minutes, 2-3 times a day.  Make sure that your baby is latched on and positioned properly while breastfeeding. If engorgement persists after 48 hours of following these recommendations,  contact your health care provider or a lactation consultant. Overall health care recommendations while breastfeeding  Eat 3 healthy meals and 3 snacks every day. Well-nourished mothers who are breastfeeding need an additional 450-500 calories a day. You can meet this requirement by increasing the amount of a balanced diet that you eat.  Drink enough water to keep your urine pale yellow or clear.  Rest   often, relax, and continue to take your prenatal vitamins to prevent fatigue, stress, and low vitamin and mineral levels in your body (nutrient deficiencies).  Do not use any products that contain nicotine or tobacco, such as cigarettes and e-cigarettes. Your baby may be harmed by chemicals from cigarettes that pass into breast milk and exposure to secondhand smoke. If you need help quitting, ask your health care provider.  Avoid alcohol.  Do not use illegal drugs or marijuana.  Talk with your health care provider before taking any medicines. These include over-the-counter and prescription medicines as well as vitamins and herbal supplements. Some medicines that may be harmful to your baby can pass through breast milk.  It is possible to become pregnant while breastfeeding. If birth control is desired, ask your health care provider about options that will be safe while breastfeeding your baby. Where to find more information: [employer]: www.llli.org Contact a health care provider if:  You feel like you want to stop breastfeeding or have become frustrated with breastfeeding.  Your nipples are cracked or bleeding.  Your breasts are red, tender, or warm.  You have: ? Painful breasts or nipples. ? A swollen area on either breast. ? A fever or chills. ? Nausea or vomiting. ? Drainage other than breast milk from your nipples.  Your breasts do not become full before feedings by the fifth day after you give birth.  You feel sad and depressed.  Your baby is: ? Too  sleepy to eat well. ? Having trouble sleeping. ? More than 1 week old and wetting fewer than 6 diapers in a 24-hour period. ? Not gaining weight by 5 days of age.  Your baby has fewer than 3 stools in a 24-hour period.  Your baby's skin or the white parts of his or her eyes become yellow. Get help right away if:  Your baby is overly tired (lethargic) and does not want to wake up and feed.  Your baby develops an unexplained fever. Summary  Breastfeeding offers many health benefits for infant and mothers.  Try to breastfeed your infant when he or she shows early signs of hunger.  Gently tickle or stroke your baby's lips with your finger or nipple to allow the baby to open his or her mouth. Bring the baby to your breast. Make sure that much of the areola is in your baby's mouth. Offer one side and burp the baby before you offer the other side.  Talk with your health care provider or lactation consultant if you have questions or you face problems as you breastfeed. This information is not intended to replace advice given to you by your health care provider. Make sure you discuss any questions you have with your health care provider. Document Revised: 02/11/2018 Document Reviewed: 12/19/2016 Elsevier Patient Education  2021 [employer]vier Inc.  

## 2021-03-26 NOTE — Progress Notes (Signed)
NOB [redacted]w[redacted]d   Intake done on 03/21/21 w/U/S. PHQ-9 =0 Last pap:10/11/2020 w/Annual Exam. Genetic Screening: Desires    CC: Nausea wants to discuss other Rx .  FHT's not heard w/doppler will get provider assistance with handheld.

## 2021-03-27 LAB — CBC/D/PLT+RPR+RH+ABO+RUB AB...
Antibody Screen: NEGATIVE
Basophils Absolute: 0 10*3/uL (ref 0.0–0.2)
Basos: 0 %
EOS (ABSOLUTE): 0 10*3/uL (ref 0.0–0.4)
Eos: 1 %
HCV Ab: <0.1 s/co ratio (ref 0.0–0.9)
HIV Screen 4th Generation wRfx: NONREACTIVE
Hematocrit: 38 % (ref 34.0–46.6)
Hemoglobin: 12.7 g/dL (ref 11.1–15.9)
Hepatitis B Surface Ag: NEGATIVE
Immature Grans (Abs): 0 10*3/uL (ref 0.0–0.1)
Immature Granulocytes: 0 %
Lymphocytes Absolute: 2.1 10*3/uL (ref 0.7–3.1)
Lymphs: 38 %
MCH: 29.3 pg (ref 26.6–33.0)
MCHC: 33.4 g/dL (ref 31.5–35.7)
MCV: 88 fL (ref 79–97)
Monocytes Absolute: 0.6 10*3/uL (ref 0.1–0.9)
Monocytes: 11 %
Neutrophils Absolute: 2.7 10*3/uL (ref 1.4–7.0)
Neutrophils: 50 %
Platelets: 320 10*3/uL (ref 150–450)
RBC: 4.33 x10E6/uL (ref 3.77–5.28)
RDW: 13.1 % (ref 11.7–15.4)
RPR Ser Ql: NONREACTIVE
Rh Factor: POSITIVE
Rubella Antibodies, IGG: 1.4 index (ref >0.99)
WBC: 5.4 10*3/uL (ref 3.4–10.8)

## 2021-03-27 LAB — CERVICOVAGINAL ANCILLARY ONLY
Chlamydia: NEGATIVE
Comment: NEGATIVE
Comment: NEGATIVE
Comment: NORMAL
Neisseria Gonorrhea: NEGATIVE
Trichomonas: NEGATIVE

## 2021-03-27 LAB — T3, FREE: T3, Free: 3.2 pg/mL (ref 2.0–4.4)

## 2021-03-27 LAB — T4, FREE: Free T4: 1.2 ng/dL (ref 0.82–1.77)

## 2021-03-27 LAB — HCV INTERPRETATION

## 2021-03-27 LAB — TSH: TSH: 0.907 u[IU]/mL (ref 0.450–4.500)

## 2021-03-28 LAB — CULTURE, OB URINE

## 2021-03-28 LAB — URINE CULTURE, OB REFLEX

## 2021-04-01 ENCOUNTER — Encounter: Payer: Self-pay | Admitting: Obstetrics and Gynecology

## 2021-04-03 ENCOUNTER — Encounter: Payer: Self-pay | Admitting: Obstetrics and Gynecology

## 2021-04-09 ENCOUNTER — Telehealth: Payer: Self-pay

## 2021-04-09 ENCOUNTER — Ambulatory Visit (INDEPENDENT_AMBULATORY_CARE_PROVIDER_SITE_OTHER): Payer: Medicaid Other | Admitting: Licensed Clinical Social Worker

## 2021-04-09 DIAGNOSIS — F439 Reaction to severe stress, unspecified: Secondary | ICD-10-CM

## 2021-04-09 NOTE — BH Specialist Note (Signed)
Patient requested a call back- no answer or voice mail to leave a message.

## 2021-04-09 NOTE — Telephone Encounter (Signed)
Patient had a virtual visit with [facility] and requested a call back from nurse triage. Tried calling patient no answer or voice mail to leave a message.

## 2021-04-10 ENCOUNTER — Other Ambulatory Visit: Payer: Self-pay

## 2021-04-10 MED ORDER — TERCONAZOLE 0.4 % VA CREA: 1.0000 | TOPICAL_CREAM | Freq: Every day | VAGINAL | 0 refills | Status: DC

## 2021-04-10 NOTE — Telephone Encounter (Signed)
Patient thinks she has a yeast infection.She would like something called into the pharmacy.Symptons include itching with discharge.

## 2021-04-10 NOTE — BH Specialist Note (Signed)
Integrated Behavioral Health via Telemedicine Visit  04/10/2021 Darlene Morgan [id number]  Number of Integrated Behavioral Health visits: 1/6 Session Start time: 10:00am   Session End time: 10:27am Total time: 27 mins via mychart   Referring Provider: Pt self referrred  Patient/Family location: [facility] Provider location: [facility]  All persons participating in visit: Pt Darlene Morgan and LCSWA A. Felton Clinton  Types of Service: [employer] [facility]   I connected with Darlene Morgan and/or Darlene Morgan's n/a via  Telephone or Engineer, civil (consulting)  (Video is Caregility application) and verified that I am speaking with the correct person using two identifiers. Discussed confidentiality: yes   I discussed the limitations of telemedicine and the availability of in person appointments.  Discussed there is a possibility of technology failure and discussed alternative modes of communication if that failure occurs.  I discussed that engaging in this telemedicine visit, they consent to the provision of behavioral healthcare and the services will be billed under their insurance.  Patient and/or legal guardian expressed understanding and consented to Telemedicine visit: yes  Presenting Concerns: Patient and/or family reports the following symptoms/concerns: situational stress  Duration of problem: three weeks ; Severity of problem: mild   Patient and/or Family's Strengths/Protective Factors: Secure connections in place supportive family and stable housing   Goals Addressed: Patient will: 1.  Reduce symptoms of: stress   2.  Increase knowledge and/or ability of: healthy habits  3.  Demonstrate ability to: self manage symptoms   Progress towards Goals:  Ongoing   Interventions: Interventions utilized:  Supportive counseling  Standardized Assessments completed:  Flowsheet Row Clinical Support from 03/21/2021 in [facility]  PHQ-9 Total  Score 2        Assessment: Patient currently experiencing situational stress   Patient may benefit from integrated behavioral health   Plan: 1. Follow up with behavioral health clinician on : as needed  2. Behavioral recommendations: engage in stress reducing activity, keep all scheduled appts, practice good sleep hygiene and  3. Referral(s): n/a  I discussed the assessment and treatment plan with the patient and/or parent/guardian. They were provided an opportunity to ask questions and all were answered. They agreed with the plan and demonstrated an understanding of the instructions.   They were advised to call back or seek an in-person evaluation if the symptoms worsen or if the condition fails to improve as anticipated.  Gwyndolyn Saxon, LCSW

## 2021-04-11 ENCOUNTER — Other Ambulatory Visit: Payer: Self-pay

## 2021-04-24 ENCOUNTER — Ambulatory Visit (INDEPENDENT_AMBULATORY_CARE_PROVIDER_SITE_OTHER): Payer: Medicaid Other | Admitting: Women's Health

## 2021-04-24 ENCOUNTER — Other Ambulatory Visit: Payer: Self-pay

## 2021-04-24 VITALS — BP 104/68 | HR 90 | Wt 154.0 lb

## 2021-04-24 DIAGNOSIS — N390 Urinary tract infection, site not specified: Secondary | ICD-10-CM

## 2021-04-24 DIAGNOSIS — E0590 Thyrotoxicosis, unspecified without thyrotoxic crisis or storm: Secondary | ICD-10-CM

## 2021-04-24 DIAGNOSIS — N760 Acute vaginitis: Secondary | ICD-10-CM

## 2021-04-24 DIAGNOSIS — Z3481 Encounter for supervision of other normal pregnancy, first trimester: Secondary | ICD-10-CM

## 2021-04-24 DIAGNOSIS — B9620 Unspecified Escherichia coli [E. coli] as the cause of diseases classified elsewhere: Secondary | ICD-10-CM

## 2021-04-24 DIAGNOSIS — B9689 Other specified bacterial agents as the cause of diseases classified elsewhere: Secondary | ICD-10-CM

## 2021-04-24 DIAGNOSIS — Z3A14 14 weeks gestation of pregnancy: Secondary | ICD-10-CM

## 2021-04-24 DIAGNOSIS — Z141 Cystic fibrosis carrier: Secondary | ICD-10-CM | POA: Insufficient documentation

## 2021-04-24 MED ORDER — METRONIDAZOLE 500 MG PO TABS: 500.0000 mg | ORAL_TABLET | Freq: Two times a day (BID) | ORAL | 0 refills | Status: DC

## 2021-04-24 NOTE — Patient Instructions (Addendum)
Maternity Assessment Unit (MAU)  The Maternity Assessment Unit (MAU) is located at the [facility] at [facility]. The address is: [address], [place], [place], [place] [address]. Please see map below for additional directions.    The Maternity Assessment Unit is designed to help you during your pregnancy, and for up to 6 weeks after delivery, with any pregnancy- or postpartum-related emergencies, if you think you are in labor, or if your water has broken. For example, if you experience nausea and vomiting, vaginal bleeding, severe abdominal or pelvic pain, elevated blood pressure or other problems related to your pregnancy or postpartum time, please come to the Maternity Assessment Unit for assistance.        Second Trimester of Pregnancy  The second trimester of pregnancy is from week 13 through week 27. This is months 4 through 6 of pregnancy. The second trimester is often a time when you feel your best. Your body has adjusted to being pregnant, and you begin to feel better physically. During the second trimester:  Morning sickness has lessened or stopped completely.  You may have more energy.  You may have an increase in appetite. The second trimester is also a time when the unborn baby (fetus) is growing rapidly. At the end of the sixth month, the fetus may be up to 12 inches long and weigh about 1 pounds. You will likely begin to feel the baby move (quickening) between 16 and 20 weeks of pregnancy. Body changes during your second trimester Your body continues to go through many changes during your second trimester. The changes vary and generally return to normal after the baby is born. Physical changes  Your weight will continue to increase. You will notice your lower abdomen bulging out.  You may begin to get stretch marks on your hips, abdomen, and breasts.  Your breasts will continue to grow and to become tender.  Dark spots or  blotches (chloasma or mask of pregnancy) may develop on your face.  A dark line from your belly button to the pubic area (linea nigra) may appear.  You may have changes in your hair. These can include thickening of your hair, rapid growth, and changes in texture. Some people also have hair loss during or after pregnancy, or hair that feels dry or thin. Health changes  You may develop headaches.  You may have heartburn.  You may develop constipation.  You may develop hemorrhoids or swollen, bulging veins (varicose veins).  Your gums may bleed and may be sensitive to brushing and flossing.  You may urinate more often because the fetus is pressing on your bladder.  You may have back pain. This is caused by: ? Weight gain. ? Pregnancy hormones that are relaxing the joints in your pelvis. ? A shift in weight and the muscles that support your balance. Follow these instructions at home: Medicines  Follow your health care provider's instructions regarding medicine use. Specific medicines may be either safe or unsafe to take during pregnancy. Do not take any medicines unless approved by your health care provider.  Take a prenatal vitamin that contains at least 600 micrograms (mcg) of folic acid. Eating and drinking  Eat a healthy diet that includes fresh fruits and vegetables, whole grains, good sources of protein such as meat, eggs, or tofu, and low-fat dairy products.  Avoid raw meat and unpasteurized juice, milk, and cheese. These carry germs that can harm you and your baby.  You may need  to take these actions to prevent or treat constipation: ? Drink enough fluid to keep your urine pale yellow. ? Eat foods that are high in fiber, such as beans, whole grains, and fresh fruits and vegetables. ? Limit foods that are high in fat and processed sugars, such as fried or sweet foods. Activity  Exercise only as directed by your health care provider. Most people can continue their usual  exercise routine during pregnancy. Try to exercise for 30 minutes at least 5 days a week. Stop exercising if you develop contractions in your uterus.  Stop exercising if you develop pain or cramping in the lower abdomen or lower back.  Avoid exercising if it is very hot or humid or if you are at a high altitude.  Avoid heavy lifting.  If you choose to, you may have sex unless your health care provider tells you not to. Relieving pain and discomfort  Wear a supportive bra to prevent discomfort from breast tenderness.  Take warm sitz baths to soothe any pain or discomfort caused by hemorrhoids. Use hemorrhoid cream if your health care provider approves.  Rest with your legs raised (elevated) if you have leg cramps or low back pain.  If you develop varicose veins: ? Wear support hose as told by your health care provider. ? Elevate your feet for 15 minutes, 3-4 times a day. ? Limit salt in your diet. Safety  Wear your seat belt at all times when driving or riding in a car.  Talk with your health care provider if someone is verbally or physically abusive to you. Lifestyle  Do not use hot tubs, steam rooms, or saunas.  Do not douche. Do not use tampons or scented sanitary pads.  Avoid cat litter boxes and soil used by cats. These carry germs that can cause birth defects in the baby and possibly loss of the fetus by miscarriage or stillbirth.  Do not use herbal remedies, alcohol, illegal drugs, or medicines that are not approved by your health care provider. Chemicals in these products can harm your baby.  Do not use any products that contain nicotine or tobacco, such as cigarettes, e-cigarettes, and chewing tobacco. If you need help quitting, ask your health care provider. General instructions  During a routine prenatal visit, your health care provider will do a physical exam and other tests. He or she will also discuss your overall health. Keep all follow-up visits. This is  important.  Ask your health care provider for a referral to a local prenatal education class.  Ask for help if you have counseling or nutritional needs during pregnancy. Your health care provider can offer advice or refer you to specialists for help with various needs. Where to find more information  American Pregnancy Association: americanpregnancy.org  [employer]: https://www.todd-brady.net/  Office on [employer] [facility]: MightyReward.co.nz Contact a health care provider if you have:  A headache that does not go away when you take medicine.  Vision changes or you see spots in front of your eyes.  Mild pelvic cramps, pelvic pressure, or nagging pain in the abdominal area.  Persistent nausea, vomiting, or diarrhea.  A bad-smelling vaginal discharge or foul-smelling urine.  Pain when you urinate.  Sudden or extreme swelling of your face, hands, ankles, feet, or legs.  A fever. Get help right away if you:  Have fluid leaking from your vagina.  Have spotting or bleeding from your vagina.  Have severe abdominal cramping or pain.  Have difficulty  breathing.  Have chest pain.  Have fainting spells.  Have not felt your baby move for the time period told by your health care provider.  Have new or increased pain, swelling, or redness in an arm or leg. Summary  The second trimester of pregnancy is from week 13 through week 27 (months 4 through 6).  Do not use herbal remedies, alcohol, illegal drugs, or medicines that are not approved by your health care provider. Chemicals in these products can harm your baby.  Exercise only as directed by your health care provider. Most people can continue their usual exercise routine during pregnancy.  Keep all follow-up visits. This is important. This information is not intended to replace advice given to you by your health care provider. Make sure you discuss any questions you  have with your health care provider. Document Revised: 04/25/2020 Document Reviewed: 03/01/2020 Elsevier Patient Education  2021 Elsevier Inc.        Round Ligament Pain  The round ligament is a cord of muscle and tissue that helps support the uterus. It can become a source of pain during pregnancy if it becomes stretched or twisted as the baby grows. The pain usually begins in the second trimester (13-28 weeks) of pregnancy, and it can come and go until the baby is delivered. It is not a serious problem, and it does not cause harm to the baby. Round ligament pain is usually a short, sharp, and pinching pain, but it can also be a dull, lingering, and aching pain. The pain is felt in the lower side of the abdomen or in the groin. It usually starts deep in the groin and moves up to the outside of the hip area. The pain may occur when you:  Suddenly change position, such as quickly going from a sitting to standing position.  Roll over in bed.  Cough or sneeze.  Do physical activity. Follow these instructions at home:  Watch your condition for any changes.  When the pain starts, relax. Then try any of these methods to help with the pain: ? Sitting down. ? Flexing your knees up to your abdomen. ? Lying on your side with one pillow under your abdomen and another pillow between your legs. ? Sitting in a warm bath for 15-20 minutes or until the pain goes away.  Take over-the-counter and prescription medicines only as told by your health care provider.  Move slowly when you sit down or stand up.  Avoid long walks if they cause pain.  Stop or reduce your physical activities if they cause pain.  Keep all follow-up visits as told by your health care provider. This is important.   Contact a health care provider if:  Your pain does not go away with treatment.  You feel pain in your back that you did not have before.  Your medicine is not helping. Get help right away if:  You have  a fever or chills.  You develop uterine contractions.  You have vaginal bleeding.  You have nausea or vomiting.  You have diarrhea.  You have pain when you urinate. Summary  Round ligament pain is felt in the lower abdomen or groin. It is usually a short, sharp, and pinching pain. It can also be a dull, lingering, and aching pain.  This pain usually begins in the second trimester (13-28 weeks). It occurs because the uterus is stretching with the growing baby, and it is not harmful to the baby.  You may notice the  pain when you suddenly change position, when you cough or sneeze, or during physical activity.  Relaxing, flexing your knees to your abdomen, lying on one side, or taking a warm bath may help to get rid of the pain.  Get help from your health care provider if the pain does not go away or if you have vaginal bleeding, nausea, vomiting, diarrhea, or painful urination. This information is not intended to replace advice given to you by your health care provider. Make sure you discuss any questions you have with your health care provider. Document Revised: 05/05/2018 Document Reviewed: 05/05/2018 Elsevier Patient Education  2021 Elsevier Inc.        Contraception Choices - www.bedsider.org Contraception, also called birth control, refers to methods or devices that prevent pregnancy. Hormonal methods Contraceptive implant A contraceptive implant is a thin, plastic tube that contains a hormone that prevents pregnancy. It is different from an intrauterine device (IUD). It is inserted into the upper part of the arm by a health care provider. Implants can be effective for up to 3 years. Progestin-only injections Progestin-only injections are injections of progestin, a synthetic form of the hormone progesterone. They are given every 3 months by a health care provider. Birth control pills Birth control pills are pills that contain hormones that prevent pregnancy. They must be  taken once a day, preferably at the same time each day. A prescription is needed to use this method of contraception. Birth control patch The birth control patch contains hormones that prevent pregnancy. It is placed on the skin and must be changed once a week for three weeks and removed on the fourth week. A prescription is needed to use this method of contraception. Vaginal ring A vaginal ring contains hormones that prevent pregnancy. It is placed in the vagina for three weeks and removed on the fourth week. After that, the process is repeated with a new ring. A prescription is needed to use this method of contraception. Emergency contraceptive Emergency contraceptives prevent pregnancy after unprotected sex. They come in pill form and can be taken up to 5 days after sex. They work best the sooner they are taken after having sex. Most emergency contraceptives are available without a prescription. This method should not be used as your only form of birth control.   Barrier methods Female condom A female condom is a thin sheath that is worn over the penis during sex. Condoms keep sperm from going inside a woman's body. They can be used with a sperm-killing substance (spermicide) to increase their effectiveness. They should be thrown away after one use. Female condom A female condom is a soft, loose-fitting sheath that is put into the vagina before sex. The condom keeps sperm from going inside a woman's body. They should be thrown away after one use. Diaphragm A diaphragm is a soft, dome-shaped barrier. It is inserted into the vagina before sex, along with a spermicide. The diaphragm blocks sperm from entering the uterus, and the spermicide kills sperm. A diaphragm should be left in the vagina for 6-8 hours after sex and removed within 24 hours. A diaphragm is prescribed and fitted by a health care provider. A diaphragm should be replaced every 1-2 years, after giving birth, after gaining more than 15 lb  (6.8 kg), and after pelvic surgery. Cervical cap A cervical cap is a round, soft latex or plastic cup that fits over the cervix. It is inserted into the vagina before sex, along with spermicide. It blocks sperm from  entering the uterus. The cap should be left in place for 6-8 hours after sex and removed within 48 hours. A cervical cap must be prescribed and fitted by a health care provider. It should be replaced every 2 years. Sponge A sponge is a soft, circular piece of polyurethane foam with spermicide in it. The sponge helps block sperm from entering the uterus, and the spermicide kills sperm. To use it, you make it wet and then insert it into the vagina. It should be inserted before sex, left in for at least 6 hours after sex, and removed and thrown away within 30 hours. Spermicides Spermicides are chemicals that kill or block sperm from entering the cervix and uterus. They can come as a cream, jelly, suppository, foam, or tablet. A spermicide should be inserted into the vagina with an applicator at least 10-15 minutes before sex to allow time for it to work. The process must be repeated every time you have sex. Spermicides do not require a prescription.   Intrauterine contraception Intrauterine device (IUD) An IUD is a T-shaped device that is put in a woman's uterus. There are two types:  Hormone IUD.This type contains progestin, a synthetic form of the hormone progesterone. This type can stay in place for 3-5 years.  Copper IUD.This type is wrapped in copper wire. It can stay in place for 10 years. Permanent methods of contraception Female tubal ligation In this method, a woman's fallopian tubes are sealed, tied, or blocked during surgery to prevent eggs from traveling to the uterus. Hysteroscopic sterilization In this method, a small, flexible insert is placed into each fallopian tube. The inserts cause scar tissue to form in the fallopian tubes and block them, so sperm cannot reach an egg.  The procedure takes about 3 months to be effective. Another form of birth control must be used during those 3 months. Female sterilization This is a procedure to tie off the tubes that carry sperm (vasectomy). After the procedure, the man can still ejaculate fluid (semen). Another form of birth control must be used for 3 months after the procedure. Natural planning methods Natural family planning In this method, a couple does not have sex on days when the woman could become pregnant. Calendar method In this method, the woman keeps track of the length of each menstrual cycle, identifies the days when pregnancy can happen, and does not have sex on those days. Ovulation method In this method, a couple avoids sex during ovulation. Symptothermal method This method involves not having sex during ovulation. The woman typically checks for ovulation by watching changes in her temperature and in the consistency of cervical mucus. Post-ovulation method In this method, a couple waits to have sex until after ovulation. Where to find more information  Centers for Disease Control and Prevention: FootballExhibition.com.br Summary  Contraception, also called birth control, refers to methods or devices that prevent pregnancy.  Hormonal methods of contraception include implants, injections, pills, patches, vaginal rings, and emergency contraceptives.  Barrier methods of contraception can include female condoms, female condoms, diaphragms, cervical caps, sponges, and spermicides.  There are two types of IUDs (intrauterine devices). An IUD can be put in a woman's uterus to prevent pregnancy for 3-5 years.  Permanent sterilization can be done through a procedure for males and females. Natural family planning methods involve nothaving sex on days when the woman could become pregnant. This information is not intended to replace advice given to you by your health care provider. Make sure you discuss  any questions you have with  your health care provider. Document Revised: 04/23/2020 Document Reviewed: 04/23/2020 Elsevier Patient Education  2021 Elsevier Inc.        Alpha-Fetoprotein Test Why am I having this test? The alpha-fetoprotein test is a lab test most commonly used for pregnant women to help screen for birth defects in their unborn baby. It can be used to screen for chromosome (DNA) abnormalities, problems with the brain or spinal cord, or problems with the abdominal wall of the unborn baby (fetus). The alpha-fetoprotein test may also be done for men or nonpregnant women to check for certain cancers. What is being tested? This test measures the amount of alpha-fetoprotein (AFP) in your blood. AFP is a protein that is made by the liver. Levels can be detected in the mother's blood during pregnancy, starting at 10 weeks and peaking at 16-18 weeks of the pregnancy. Abnormal levels can sometimes be a sign of a birth defect in the baby. Certain cancers can cause a high level of AFP in men and nonpregnant women. What kind of sample is taken? A blood sample is required for this test. It is usually collected by inserting a needle into a blood vessel.   How are the results reported? Your test results will be reported as values. Your health care provider will compare your results to normal ranges that were established after testing a large group of people (reference values). Reference values may vary among labs and hospitals. For this test, common reference values are:  Adult: Less than 40 ng/mL or less than 40 mcg/L (SI units).  Child younger than 1 year: Less than 30 ng/mL. If you are pregnant, the values may also vary based on how long you have been pregnant. What do the results mean? Results that are above the reference values in pregnant women may indicate the following for the baby:  Neural tube defects, such as abnormalities of the spinal cord or brain.  Abdominal wall defects.  Multiple pregnancy  such as twins.  Fetal distress or fetal death. Results that are above the reference values in men or nonpregnant women may indicate:  Reproductive cancers, such as ovarian or testicular cancer.  Liver cancer.  Liver cell death.  Other types of cancer. Very low levels of AFP in pregnant women may indicate Down syndrome for the baby. Talk with your health care provider about what your results mean. Questions to ask your health care provider Ask your health care provider, or the department that is doing the test:  When will my results be ready?  How will I get my results?  What are my treatment options?  What other tests do I need?  What are my next steps? Summary  The alpha-fetoprotein test is done on pregnant women to help screen for birth defects in their unborn baby.  Certain cancers can cause a high level of AFP in men and nonpregnant women.  For this test, a blood sample is usually collected by inserting a needle into a blood vessel.  Talk with your health care provider about what your results mean. This information is not intended to replace advice given to you by your health care provider. Make sure you discuss any questions you have with your health care provider. Document Revised: 06/08/2020 Document Reviewed: 06/08/2020 Elsevier Patient Education  2021 Elsevier Inc.        Round Ligament Pain  The round ligament is a cord of muscle and tissue that helps support the uterus. It  can become a source of pain during pregnancy if it becomes stretched or twisted as the baby grows. The pain usually begins in the second trimester (13-28 weeks) of pregnancy, and it can come and go until the baby is delivered. It is not a serious problem, and it does not cause harm to the baby. Round ligament pain is usually a short, sharp, and pinching pain, but it can also be a dull, lingering, and aching pain. The pain is felt in the lower side of the abdomen or in the groin. It  usually starts deep in the groin and moves up to the outside of the hip area. The pain may occur when you:  Suddenly change position, such as quickly going from a sitting to standing position.  Roll over in bed.  Cough or sneeze.  Do physical activity. Follow these instructions at home:  Watch your condition for any changes.  When the pain starts, relax. Then try any of these methods to help with the pain: ? Sitting down. ? Flexing your knees up to your abdomen. ? Lying on your side with one pillow under your abdomen and another pillow between your legs. ? Sitting in a warm bath for 15-20 minutes or until the pain goes away.  Take over-the-counter and prescription medicines only as told by your health care provider.  Move slowly when you sit down or stand up.  Avoid long walks if they cause pain.  Stop or reduce your physical activities if they cause pain.  Keep all follow-up visits as told by your health care provider. This is important.   Contact a health care provider if:  Your pain does not go away with treatment.  You feel pain in your back that you did not have before.  Your medicine is not helping. Get help right away if:  You have a fever or chills.  You develop uterine contractions.  You have vaginal bleeding.  You have nausea or vomiting.  You have diarrhea.  You have pain when you urinate. Summary  Round ligament pain is felt in the lower abdomen or groin. It is usually a short, sharp, and pinching pain. It can also be a dull, lingering, and aching pain.  This pain usually begins in the second trimester (13-28 weeks). It occurs because the uterus is stretching with the growing baby, and it is not harmful to the baby.  You may notice the pain when you suddenly change position, when you cough or sneeze, or during physical activity.  Relaxing, flexing your knees to your abdomen, lying on one side, or taking a warm bath may help to get rid of the  pain.  Get help from your health care provider if the pain does not go away or if you have vaginal bleeding, nausea, vomiting, diarrhea, or painful urination. This information is not intended to replace advice given to you by your health care provider. Make sure you discuss any questions you have with your health care provider. Document Revised: 05/05/2018 Document Reviewed: 05/05/2018 Elsevier Patient Education  2021 [employer].

## 2021-04-24 NOTE — Progress Notes (Signed)
Subjective:  Darlene Morgan is a 25 y.o. [id number] at [redacted]w[redacted]d being seen today for ongoing prenatal care.  She is currently monitored for the following issues for this low-risk pregnancy and has Hyperthyroidism; E. 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  Patient reports no complaints.  Contractions: Not present. Vag. Bleeding: None.  Movement: Present. Denies leaking of fluid.   The following portions of the patient's history were reviewed and updated as appropriate: allergies, current medications, past family history, past medical history, past social history, past surgical history and problem list. Problem list updated.  Objective:   Vitals:   04/24/21 0931  BP: 104/68  Pulse: 90  Weight: 154 lb (69.9 kg)    Fetal Status: Fetal Heart Rate (bpm): 152   Movement: Present     General:  Alert, oriented and cooperative. Patient is in no acute distress.  Skin: Skin is warm and dry. No rash noted.   Cardiovascular: Normal heart rate noted  Respiratory: Normal respiratory effort, no problems with respiration noted  Abdomen: Soft, gravid, appropriate for gestational age. Pain/Pressure: Absent     Pelvic: Vag. Bleeding: None     Cervical exam deferred        Extremities: Normal range of motion.  Edema: Trace  Mental Status: Normal mood and affect. Normal behavior. Normal judgment and thought content.   Urinalysis:      Assessment and Plan:  Pregnancy: G4P1021 at [redacted]w[redacted]d  1. Hyperthyroidism -normal TSH/T3/T4 at NOB visit  2. E. coli UTI - pt finished Macrobid - Urine Culture  3. Encounter for supervision of other normal pregnancy in first trimester - discussed contraception, pt unsure, info given - pt reports since taking the antibiotics, she is experiencing an odor, which she only gets when she has BV. Patient reports she has had BV multiple times in the past and knows that's what her  symptoms are being caused by and requests RX for metronidazole, which was sent.  4. [redacted] weeks gestation of pregnancy  Preterm labor symptoms and general obstetric precautions including but not limited to vaginal bleeding, contractions, leaking of fluid and fetal movement were reviewed in detail with the patient. I discussed the assessment and treatment plan with the patient. The patient was provided an opportunity to ask questions and all were answered. The patient agreed with the plan and demonstrated an understanding of the instructions. The patient was advised to call back or seek an in-person office evaluation/go to [facility]U at [facility] for any urgent or concerning symptoms. Please refer to After Visit Summary for other counseling recommendations.  Return in about 4 weeks (around 05/22/2021) for in-person LOB/AFP next visit/APP OK.   Korrine Sicard, Odie Sera, NP

## 2021-05-27 ENCOUNTER — Ambulatory Visit: Payer: Medicaid Other | Attending: Obstetrics | Admitting: Obstetrics

## 2021-05-27 ENCOUNTER — Other Ambulatory Visit: Payer: Self-pay | Admitting: Obstetrics and Gynecology

## 2021-05-27 ENCOUNTER — Other Ambulatory Visit: Payer: Self-pay

## 2021-05-27 ENCOUNTER — Ambulatory Visit: Payer: Medicaid Other | Attending: Obstetrics and Gynecology

## 2021-05-27 ENCOUNTER — Other Ambulatory Visit: Payer: Self-pay | Admitting: *Deleted

## 2021-05-27 DIAGNOSIS — Z3A19 19 weeks gestation of pregnancy: Secondary | ICD-10-CM | POA: Diagnosis present

## 2021-05-27 DIAGNOSIS — E079 Disorder of thyroid, unspecified: Secondary | ICD-10-CM

## 2021-05-27 DIAGNOSIS — Z3689 Encounter for other specified antenatal screening: Secondary | ICD-10-CM | POA: Diagnosis present

## 2021-05-27 DIAGNOSIS — O99282 Endocrine, nutritional and metabolic diseases complicating pregnancy, second trimester: Secondary | ICD-10-CM

## 2021-05-27 DIAGNOSIS — E0590 Thyrotoxicosis, unspecified without thyrotoxic crisis or storm: Secondary | ICD-10-CM

## 2021-05-27 DIAGNOSIS — Z363 Encounter for antenatal screening for malformations: Secondary | ICD-10-CM | POA: Diagnosis not present

## 2021-05-27 DIAGNOSIS — Z3482 Encounter for supervision of other normal pregnancy, second trimester: Secondary | ICD-10-CM | POA: Insufficient documentation

## 2021-05-27 DIAGNOSIS — O09892 Supervision of other high risk pregnancies, second trimester: Secondary | ICD-10-CM

## 2021-05-27 DIAGNOSIS — Z141 Cystic fibrosis carrier: Secondary | ICD-10-CM

## 2021-05-27 DIAGNOSIS — Z3481 Encounter for supervision of other normal pregnancy, first trimester: Secondary | ICD-10-CM | POA: Diagnosis present

## 2021-05-27 DIAGNOSIS — Z362 Encounter for other antenatal screening follow-up: Secondary | ICD-10-CM

## 2021-05-27 NOTE — Progress Notes (Signed)
MFM Note  Darlene Morgan was seen for a detailed fetal anatomy scan due to hyperthyroidism that is not currently treated with any medications.  The patient has also screened positive as a carrier for the cystic fibrosis gene.   She denies any other significant past medical history and denies any problems in her current pregnancy.    She had a cell free DNA test earlier in her pregnancy which indicated a low risk for trisomy 53, 14, and 13. A female fetus is predicted.   She was informed that the fetal growth and amniotic fluid level were appropriate for her gestational age.   There were no obvious fetal anomalies noted on today's ultrasound exam.  However, the views of the fetal anatomy were limited today due to the fetal position.  The patient was informed that anomalies may be missed due to technical limitations. If the fetus is in a suboptimal position or maternal habitus is increased, visualization of the fetus in the maternal uterus may be impaired.  Due to her history of hyperthyroidism, she should have her thyroid function tests checked at least once each trimester.  Should her thyroid function tests be abnormal, either methimazole or PTU may be used for treatment of hyperthyroidism in pregnancy.  We will continue to follow her with growth ultrasounds throughout her pregnancy.  The implications of being a carrier for the cystic fibrosis gene was discussed with the patient and her partner.  I will schedule a genetic counseling appointment for her so that her partner can be screened to determine if he is also a carrier for cystic fibrosis.  They were reassured that should he screen negative, that the chances that their child will have cystic fibrosis is very low.  Should it be determined that he is also a carrier for cystic fibrosis, they understand that there would be a 1 in 4 chance that their child will have cystic fibrosis.  They are aware that definitive prenatal diagnosis of cystic fibrosis  is available through an amniocentesis.    A follow-up exam was scheduled in 4 weeks to complete the views of the fetal anatomy.   A total of 30 minutes was spent counseling and coordinating the care for this patient.  Greater than 50% of the time was spent in direct face-to-face contact.

## 2021-05-28 ENCOUNTER — Encounter: Payer: Self-pay | Admitting: Obstetrics & Gynecology

## 2021-06-04 ENCOUNTER — Ambulatory Visit: Payer: Medicaid Other

## 2021-06-11 ENCOUNTER — Ambulatory Visit: Payer: Medicaid Other | Attending: Obstetrics

## 2021-06-11 ENCOUNTER — Other Ambulatory Visit: Payer: Self-pay

## 2021-06-11 DIAGNOSIS — O09892 Supervision of other high risk pregnancies, second trimester: Secondary | ICD-10-CM

## 2021-06-11 DIAGNOSIS — Z141 Cystic fibrosis carrier: Secondary | ICD-10-CM

## 2021-06-11 NOTE — Progress Notes (Signed)
Referring provider: Dr. Jolayne Panther, [facility] Length of consultation: 30 minutes  Darlene Morgan was referred to [facility] for genetic counseling to review the results of recent carrier screening as well as testing options for this pregnancy.  The patient was seen via WebEx with her at her home and myself in the clinic.  The patient was counseled by Moishe Spice, genetic counseling intern, supervised by Katrina Stack, MS, CGC.  The patient attended the visit alone.  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 CF is a condition characterized by the buildup of thick, sticky mucus that can damage the body's organs. Mucus lubricates and protects the linings of the airways, digestive system, reproductive system, and other organs and tissues. Individuals with CF have abnormally sticky mucus that cannot easily be cleared from the airways and digestive system, leading to progressive damage to the respiratory system and chronic digestive system problems. The most common features of CF include respiratory difficulties, bacterial infections in the lungs, the formation of scar tissue (fibrosis) and cysts in the lungs, pancreatic insufficiency, CF-related diabetes mellitus, diarrhea, malnutrition, poor growth, and weight loss. Most men with CF have congenital bilateral absence of the vas deferens (CBAVD) which causes female infertility. With therapies, such as daily respiratory therapies and medications to aid digestion, the median lifespan for people with CF is now in their 40's. Treatment may involve lung transplantation and CFTR protein modulators in some cases. CF is variably expressed, meaning features of the condition and their severity vary among affected individuals. Expression and severity of CF depends upon the specific mutations present in an affected individual.   CF is caused by mutations in the CFTR gene. This gene provides instructions for a  channel that transports chloride ions into and out of cells. The flow of chloride ions helps control the movement of water in the body's tissues, which is necessary for the production of thin, freely flowing mucus. Pathogenic variants in the CFTR gene disrupt the function of the chloride channels, preventing them from regulating the flow of chloride ions and water across cell membranes. 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  CF is inherited in an autosomal recessive fashion. This means that the current fetus is only at risk for CF if Darlene Morgan's partner is also a carrier for the condition. Based on the carrier frequency for CF in the African American population, Darlene Morgan's partner has a 1 in 44 chance of being a carrier for CF. Thus, the couple currently has a 1 in 232 (0.5%) chance of having a child with CF. If Darlene Morgan's partner is also identified to be a carrier, the risk for CF in the pregnancy would be 1 in 4 (25%).   If both members of a couple are known to be carriers, then diagnostic testing is available during pregnancy to determine if the fetus is affected.  This can be performed via [facility] at 10-[redacted] weeks gestation or amniocentesis after [redacted] weeks gestation.  We discussed the risks, benefits and limitations of these testing options. If diagnostic testing during pregnancy is not desired, CF testing can be ordered on cord blood at the time of delivery and is included in newborn screening in [place].  The newborn screening testing utilizes trypsinogen levels rather than genetic testing to detect affected individuals whereas genetic testing can determine the specific variants, if  any, that a child may have inherited.  Darlene Morgan's carrier screening was negative for the other three conditions screened (spinal muscular atrophy, alpha thalassemia and beta hemoglobinopathies). Thus, her  risk to be a carrier for these additional conditions (listed separately in the laboratory report) has been reduced but not eliminated. This also significantly reduces her risk of having a child affected by one of these conditions.   We discussed that carrier testing for CF is recommended for Darlene Morgan's partner. Darlene Morgan indicated that she is interested in pursuing partner carrier screening. She also understands that newborn screening performed for all infants in [place] assesses for CF after birth.  Routine screening for chromosome conditions was also discussed.  Panorama NIPS was previously ordered by her OB and the results were normal.  We also obtained a detailed family history and pregnancy history. The patient reported that she has a healthy daughter.  She has had no complications in this pregnancy and no exposure to medications, tobacccor or alcohol.  She did reported occasional marijuana use in the first trimester.  In the family history, she stated that her maternal half sister has had about 5 early miscarriages.  She is not sure of the reason for these losses. We reviewed that there may be many reasons for miscarriages including maternal health conditions, genetic causes or unknown reasons.  If more details are learned, we are happy to discuss this further.  She also stated that her maternal aunt had a son with autism and a daughter with isolated cleft palate.  We explained that clefting may occur as an isolated birth difference due to a combination of genetic and environmental factors or as part of many genetic syndromes.  In the absence of a known syndrome, we expect a minimal recurrence risk in a 4th degree relative. Autism may also havve many causes.  If no genetic cause has been identified, then we would also expect a low change for recurrence in this pregnancy. The remainder of the family history was unremarkable for developmental delays, birth defects or known genetic  conditions.  We discussed the recommendation that Darlene Morgan inform her siblings about her CF carrier status, as each of Darlene Morgan's siblings have a 50% chance of being a carrier for CF themselves. Darlene Morgan's siblings and their reproductive partners may consider carrier screening for CF either during the preconception period or during pregnancies in the future to refine their chance of having a child affected by CF.  Plan of care: Carrier testing for the father of the pregnancy, Darlene Morgan (dob 06/09/91), which was scheduled for 06/11/21 at 1:30pm If he declines testing, follow up with newborn screening results after birth.  We may be reached at [phone] with any questions or concerns.   Darlene Anderson, MS, [facility]  Virtual Visit via Video Note  I connected with Darlene Morgan on 06/11/21 at  9:00 AM EDT by a video enabled telemedicine application and verified that I am speaking with the correct person using two identifiers.  Location: Patient: home Provider: clinic   I discussed the limitations of evaluation and management by telemedicine and the availability of in person appointments. The patient expressed understanding and agreed to proceed.  I provided 30 minutes of non-face-to-face time during this encounter.   Katrina Stack

## 2021-06-19 ENCOUNTER — Ambulatory Visit: Payer: Self-pay

## 2021-06-24 ENCOUNTER — Other Ambulatory Visit: Payer: Self-pay

## 2021-06-24 ENCOUNTER — Ambulatory Visit: Payer: Medicaid Other | Admitting: *Deleted

## 2021-06-24 ENCOUNTER — Other Ambulatory Visit: Payer: Self-pay | Admitting: *Deleted

## 2021-06-24 ENCOUNTER — Ambulatory Visit: Payer: Medicaid Other | Attending: Obstetrics

## 2021-06-24 ENCOUNTER — Encounter: Payer: Self-pay | Admitting: *Deleted

## 2021-06-24 VITALS — BP 115/70 | HR 83

## 2021-06-24 DIAGNOSIS — B9620 Unspecified Escherichia coli [E. coli] as the cause of diseases classified elsewhere: Secondary | ICD-10-CM

## 2021-06-24 DIAGNOSIS — Z3480 Encounter for supervision of other normal pregnancy, unspecified trimester: Secondary | ICD-10-CM | POA: Diagnosis present

## 2021-06-24 DIAGNOSIS — Z363 Encounter for antenatal screening for malformations: Secondary | ICD-10-CM | POA: Diagnosis not present

## 2021-06-24 DIAGNOSIS — O09892 Supervision of other high risk pregnancies, second trimester: Secondary | ICD-10-CM

## 2021-06-24 DIAGNOSIS — Z141 Cystic fibrosis carrier: Secondary | ICD-10-CM | POA: Diagnosis present

## 2021-06-24 DIAGNOSIS — Z362 Encounter for other antenatal screening follow-up: Secondary | ICD-10-CM | POA: Diagnosis present

## 2021-06-24 DIAGNOSIS — Z3A23 23 weeks gestation of pregnancy: Secondary | ICD-10-CM

## 2021-06-24 DIAGNOSIS — N390 Urinary tract infection, site not specified: Secondary | ICD-10-CM | POA: Insufficient documentation

## 2021-06-24 DIAGNOSIS — O09899 Supervision of other high risk pregnancies, unspecified trimester: Secondary | ICD-10-CM

## 2021-07-05 LAB — HSV DNA BY PCR (REFERENCE LAB)
HSV 1 DNA: NEGATIVE
HSV 2 DNA: NEGATIVE

## 2021-07-07 ENCOUNTER — Inpatient Hospital Stay (HOSPITAL_COMMUNITY)
Admission: AD | Admit: 2021-07-07 | Discharge: 2021-07-07 | Discharge status: Against Medical Advice | Payer: Medicaid Other | Attending: Obstetrics and Gynecology | Admitting: Obstetrics and Gynecology

## 2021-07-07 ENCOUNTER — Other Ambulatory Visit: Payer: Self-pay

## 2021-07-07 DIAGNOSIS — Z5321 Procedure and treatment not carried out due to patient leaving prior to being seen by health care provider: Secondary | ICD-10-CM | POA: Insufficient documentation

## 2021-07-07 LAB — URINALYSIS, ROUTINE W REFLEX MICROSCOPIC
Bacteria, UA: NONE SEEN
Bilirubin Urine: NEGATIVE
Glucose, UA: NEGATIVE mg/dL
Hgb urine dipstick: NEGATIVE
Ketones, ur: NEGATIVE mg/dL
Nitrite: NEGATIVE
Protein, ur: NEGATIVE mg/dL
Specific Gravity, Urine: 1.015 (ref 1.005–1.030)
pH: 8 (ref 5.0–8.0)

## 2021-07-07 NOTE — MAU Note (Signed)
Patient left AMA. AMA paper placed in chart.

## 2021-07-07 NOTE — MAU Note (Signed)
Pt reports to mau with c/o lower abd cramping that started a few hours ago while at church.  Pt denies vag bleeding or LOF.

## 2021-07-25 ENCOUNTER — Encounter: Payer: Self-pay | Admitting: Obstetrics

## 2021-07-25 ENCOUNTER — Other Ambulatory Visit: Payer: Medicaid Other

## 2021-07-25 ENCOUNTER — Ambulatory Visit (INDEPENDENT_AMBULATORY_CARE_PROVIDER_SITE_OTHER): Payer: Medicaid Other | Admitting: Obstetrics

## 2021-07-25 VITALS — BP 111/66 | HR 102 | Wt 162.7 lb

## 2021-07-25 DIAGNOSIS — Z3480 Encounter for supervision of other normal pregnancy, unspecified trimester: Secondary | ICD-10-CM

## 2021-07-25 DIAGNOSIS — Z23 Encounter for immunization: Secondary | ICD-10-CM | POA: Diagnosis not present

## 2021-07-25 DIAGNOSIS — O2313 Infections of bladder in pregnancy, third trimester: Secondary | ICD-10-CM

## 2021-07-25 DIAGNOSIS — B9629 Other Escherichia coli [E. coli] as the cause of diseases classified elsewhere: Secondary | ICD-10-CM

## 2021-07-25 DIAGNOSIS — N390 Urinary tract infection, site not specified: Secondary | ICD-10-CM

## 2021-07-25 DIAGNOSIS — B9620 Unspecified Escherichia coli [E. coli] as the cause of diseases classified elsewhere: Secondary | ICD-10-CM

## 2021-07-25 DIAGNOSIS — Z3A28 28 weeks gestation of pregnancy: Secondary | ICD-10-CM

## 2021-07-25 NOTE — Progress Notes (Signed)
Subjective:  Darlene Morgan is a 25 y.o. G4P1021 at [redacted]w[redacted]d being seen today for ongoing prenatal care.  She is currently monitored for the following issues for this low-risk pregnancy and has Hyperthyroidism; E. 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  Patient reports no complaints.  Contractions: Irritability. Vag. Bleeding: None.  Movement: Present. Denies leaking of fluid.   The following portions of the patient's history were reviewed and updated as appropriate: allergies, current medications, past family history, past medical history, past social history, past surgical history and problem list. Problem list updated.  Objective:   Vitals:   07/25/21 0912  BP: 111/66  Pulse: (!) 102  Weight: 162 lb 11.2 oz (73.8 kg)    Fetal Status: Fetal Heart Rate (bpm): 161   Movement: Present     General:  Alert, oriented and cooperative. Patient is in no acute distress.  Skin: Skin is warm and dry. No rash noted.   Cardiovascular: Normal heart rate noted  Respiratory: Normal respiratory effort, no problems with respiration noted  Abdomen: Soft, gravid, appropriate for gestational age. Pain/Pressure: Present     Pelvic:  Cervical exam deferred        Extremities: Normal range of motion.  Edema: None  Mental Status: Normal mood and affect. Normal behavior. Normal judgment and thought content.   Urinalysis:      Assessment and Plan:  Pregnancy: G4P1021 at [redacted]w[redacted]d  1. Supervision of other normal pregnancy, antepartum Rx: - Glucose Tolerance, 2 Hours w/1 Hour - CBC - RPR - HIV Antibody (routine testing w rflx) - Tdap vaccine greater than or equal to 7yo IM - Ambulatory referral to Integrated Behavioral Health  2. E. coli UTI, treated - TOC at 36 weeks  Preterm labor symptoms and general obstetric precautions including but not limited to vaginal bleeding, contractions, leaking of fluid and fetal  movement were reviewed in detail with the patient. Please refer to After Visit Summary for other counseling recommendations.   Return in about 2 weeks (around 08/08/2021) for ROB.   Brock Bad, MD  07/25/21

## 2021-07-25 NOTE — Progress Notes (Signed)
ROB 28wks  GTT, CBC, HIV, RPR today DTAP offered and accepted. Depression screen negative. Anxiety screen positive: referral to [facility]  Reports a strong UC yesterday, was not sustained.

## 2021-07-26 ENCOUNTER — Other Ambulatory Visit: Payer: Self-pay | Admitting: Obstetrics

## 2021-07-26 DIAGNOSIS — O99013 Anemia complicating pregnancy, third trimester: Secondary | ICD-10-CM

## 2021-07-26 LAB — CBC
Hematocrit: 30 % — ABNORMAL LOW (ref 34.0–46.6)
Hemoglobin: 10.6 g/dL — ABNORMAL LOW (ref 11.1–15.9)
MCH: 30.5 pg (ref 26.6–33.0)
MCHC: 35.3 g/dL (ref 31.5–35.7)
MCV: 86 fL (ref 79–97)
Platelets: 251 10*3/uL (ref 150–450)
RBC: 3.48 x10E6/uL — ABNORMAL LOW (ref 3.77–5.28)
RDW: 12.4 % (ref 11.7–15.4)
WBC: 6.3 10*3/uL (ref 3.4–10.8)

## 2021-07-26 LAB — HIV ANTIBODY (ROUTINE TESTING W REFLEX): HIV Screen 4th Generation wRfx: NONREACTIVE

## 2021-07-26 LAB — GLUCOSE TOLERANCE, 2 HOURS W/ 1HR
Glucose, 1 hour: 53 mg/dL — ABNORMAL LOW (ref 65–179)
Glucose, 2 hour: 69 mg/dL (ref 65–152)
Glucose, Fasting: 78 mg/dL (ref 65–91)

## 2021-07-26 LAB — RPR: RPR Ser Ql: NONREACTIVE

## 2021-07-26 MED ORDER — FERROUS SULFATE 325 (65 FE) MG PO TABS: 325.0000 mg | ORAL_TABLET | ORAL | 5 refills | Status: DC

## 2021-07-29 ENCOUNTER — Ambulatory Visit (INDEPENDENT_AMBULATORY_CARE_PROVIDER_SITE_OTHER): Payer: Medicaid Other | Admitting: Licensed Clinical Social Worker

## 2021-07-29 DIAGNOSIS — Z3A00 Weeks of gestation of pregnancy not specified: Secondary | ICD-10-CM | POA: Diagnosis not present

## 2021-07-29 DIAGNOSIS — O99340 Other mental disorders complicating pregnancy, unspecified trimester: Secondary | ICD-10-CM

## 2021-07-29 DIAGNOSIS — F439 Reaction to severe stress, unspecified: Secondary | ICD-10-CM | POA: Diagnosis not present

## 2021-07-29 DIAGNOSIS — F419 Anxiety disorder, unspecified: Secondary | ICD-10-CM | POA: Diagnosis not present

## 2021-07-29 NOTE — BH Specialist Note (Signed)
Integrated Behavioral Health via Telemedicine Visit  07/29/2021 Darlene Morgan [id number]  Number of Integrated Behavioral Health visits: 2/6 Session Start time: 9:30am  Session End time: 9:50am Total time: 20 mins via phone per pt request   Referring Provider: Aron Baba MD Patient/Family location: Home  [facility] Provider location: [facility] All persons participating in visit: Pt Darlene Morgan and LCSW A. Felton Clinton  Types of Service: General Behavioral Integrated Care (BHI)  I connected with Darlene Morgan and/or Darlene Morgan's  via n/a  Telephone or Video Enabled Telemedicine Application  (Video is Caregility application) and verified that I am speaking with the correct person using two identifiers. Discussed confidentiality: Yes   I discussed the limitations of telemedicine and the availability of in person appointments.  Discussed there is a possibility of technology failure and discussed alternative modes of communication if that failure occurs.  I discussed that engaging in this telemedicine visit, they consent to the provision of behavioral healthcare and the services will be billed under their insurance.  Patient and/or legal guardian expressed understanding and consented to Telemedicine visit: Yes   Presenting Concerns: Patient and/or family reports the following symptoms/concerns: anxiety, situational stress  Duration of problem: approx one month ; Severity of problem: mild  Patient and/or Family's Strengths/Protective Factors: Concrete supports in place (healthy food, safe environments, etc.)  Goals Addressed: Patient will:  Reduce symptoms of: anxiety and stress   Increase knowledge and/or ability of: coping skills, self-management skills, and stress reduction   Demonstrate ability to: Increase adequate support systems for patient/family  Progress towards Goals: Ongoing  Interventions: Interventions utilized:  Mindfulness or Relaxation Training and Supportive  Counseling Standardized Assessments completed: Not Needed  Patient and/or Family Response: Darlene Morgan reports anxiety has increased due to conflict with mom and father of her baby. Darlene Morgan reports she recently moved in to her apartment is finishing up her last year of school. LCSW A. Felton Clinton recommended reconciling with mother to improve relationship and added support. Darlene Morgan and A. Felton Clinton discuss prioritizing task and not piling responsibilities too much.  Assessment: Patient currently experiencing anxiety affecting pregnancy and increased stress due to family conflict.    Patient may benefit from integrated behavioral health .  Plan: Follow up with behavioral health clinician on : 08/08/2021 Behavioral recommendations: Delegate task, consider reaching out to mom for added support  Referral(s): Integrated [employer] (In Clinic)  I discussed the assessment and treatment plan with the patient and/or parent/guardian. They were provided an opportunity to ask questions and all were answered. They agreed with the plan and demonstrated an understanding of the instructions.   They were advised to call back or seek an in-person evaluation if the symptoms worsen or if the condition fails to improve as anticipated.  Gwyndolyn Saxon, LCSW

## 2021-07-31 ENCOUNTER — Telehealth: Payer: Self-pay | Admitting: *Deleted

## 2021-07-31 ENCOUNTER — Other Ambulatory Visit: Payer: Self-pay | Admitting: *Deleted

## 2021-07-31 DIAGNOSIS — N760 Acute vaginitis: Secondary | ICD-10-CM

## 2021-07-31 DIAGNOSIS — B9689 Other specified bacterial agents as the cause of diseases classified elsewhere: Secondary | ICD-10-CM

## 2021-07-31 MED ORDER — METRONIDAZOLE 500 MG PO TABS
500.0000 mg | ORAL_TABLET | Freq: Two times a day (BID) | ORAL | 0 refills | Status: DC
Start: 2021-07-31 — End: 2021-09-16

## 2021-07-31 NOTE — Telephone Encounter (Signed)
Patient called front office reporting BV and need to refill RX for Flagyl. Recent encounter and RX for Flagyl. Flagyl reordered. Patient notified.

## 2021-08-08 ENCOUNTER — Ambulatory Visit (INDEPENDENT_AMBULATORY_CARE_PROVIDER_SITE_OTHER): Payer: Medicaid Other

## 2021-08-08 ENCOUNTER — Other Ambulatory Visit: Payer: Self-pay

## 2021-08-08 VITALS — BP 116/77 | HR 90 | Wt 164.0 lb

## 2021-08-08 DIAGNOSIS — Z3A30 30 weeks gestation of pregnancy: Secondary | ICD-10-CM

## 2021-08-08 DIAGNOSIS — Z3480 Encounter for supervision of other normal pregnancy, unspecified trimester: Secondary | ICD-10-CM

## 2021-08-08 NOTE — Progress Notes (Signed)
ROB 30w  CC: Discharge no odor or itching declines swab.

## 2021-08-08 NOTE — Patient Instructions (Signed)

## 2021-08-08 NOTE — Progress Notes (Signed)
   PRENATAL VISIT NOTE  Subjective:  Darlene Morgan is a 25 y.o. G4P1021 at [redacted]w[redacted]d being seen today for ongoing prenatal care.  She is currently monitored for the following issues for this low-risk pregnancy and has Hyperthyroidism; E. 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  Patient reports no complaints.  Contractions: Irritability. Vag. Bleeding: None.  Movement: Present. Denies leaking of fluid.   The following portions of the patient's history were reviewed and updated as appropriate: allergies, current medications, past family history, past medical history, past social history, past surgical history and problem list.   Objective:   Vitals:   08/08/21 1000  Weight: 164 lb (74.4 kg)    Fetal Status:     Movement: Present     General:  Alert, oriented and cooperative. Patient is in no acute distress.  Skin: Skin is warm and dry. No rash noted.   Cardiovascular: Normal heart rate noted  Respiratory: Normal respiratory effort, no problems with respiration noted  Abdomen: Soft, gravid, appropriate for gestational age.  Pain/Pressure: Absent     Pelvic: Cervical exam deferred        Extremities: Normal range of motion.  Edema: None  Mental Status: Normal mood and affect. Normal behavior. Normal judgment and thought content.   Assessment and Plan:  Pregnancy: G4P1021 at [redacted]w[redacted]d 1. Encounter for supervision of normal pregnancy in multigravida - No complaints. Routine care - Anticipatory guidance of next visits reviewed - Patient prefers to see midwives going forward, will schedule accordingly.  - F/u growth ultrasound scheduled 9/26  2. [redacted] weeks gestation of pregnancy   Preterm labor symptoms and general obstetric precautions including but not limited to vaginal bleeding, contractions, leaking of fluid and fetal movement were reviewed in detail with the patient. Please refer to After Visit  Summary for other counseling recommendations.   Return in about 2 weeks (around 08/22/2021) for Return OB visit.  Future Appointments  Date Time Provider Department Center  08/26/2021 10:45 AM [facility] NURSE [facility]  08/26/2021 11:00 AM [facility] US1 [facility]    Rolm Bookbinder, CNM

## 2021-08-19 ENCOUNTER — Other Ambulatory Visit: Payer: Self-pay

## 2021-08-19 ENCOUNTER — Ambulatory Visit (INDEPENDENT_AMBULATORY_CARE_PROVIDER_SITE_OTHER): Payer: Medicaid Other | Admitting: Obstetrics

## 2021-08-19 ENCOUNTER — Encounter: Payer: Self-pay | Admitting: Obstetrics

## 2021-08-19 VITALS — BP 116/75 | HR 108 | Wt 165.0 lb

## 2021-08-19 DIAGNOSIS — Z3480 Encounter for supervision of other normal pregnancy, unspecified trimester: Secondary | ICD-10-CM

## 2021-08-19 DIAGNOSIS — B373 Candidiasis of vulva and vagina: Secondary | ICD-10-CM

## 2021-08-19 DIAGNOSIS — E0590 Thyrotoxicosis, unspecified without thyrotoxic crisis or storm: Secondary | ICD-10-CM

## 2021-08-19 DIAGNOSIS — B3731 Acute candidiasis of vulva and vagina: Secondary | ICD-10-CM

## 2021-08-19 DIAGNOSIS — O2613 Low weight gain in pregnancy, third trimester: Secondary | ICD-10-CM

## 2021-08-19 MED ORDER — TERCONAZOLE 0.4 % VA CREA: 1.0000 | TOPICAL_CREAM | Freq: Every day | VAGINAL | 0 refills | Status: DC

## 2021-08-19 NOTE — Progress Notes (Signed)
Subjective:  Darlene Morgan is a 25 y.o. W1U9323 at [redacted]w[redacted]d being seen today for ongoing prenatal care.  She is currently monitored for the following issues for this low-risk pregnancy and has Hyperthyroidism; E. 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  Patient reports heartburn and decreased appetite, not eating, resulting in poor weight gain .  Contractions: Not present. Vag. Bleeding: None.  Movement: Present. Denies leaking of fluid.   The following portions of the patient's history were reviewed and updated as appropriate: allergies, current medications, past family history, past medical history, past social history, past surgical history and problem list. Problem list updated.  Objective:   Vitals:   08/19/21 1050  BP: 116/75  Pulse: (!) 108  Weight: 165 lb (74.8 kg)    Fetal Status:     Movement: Present     General:  Alert, oriented and cooperative. Patient is in no acute distress.  Skin: Skin is warm and dry. No rash noted.   Cardiovascular: Normal heart rate noted  Respiratory: Normal respiratory effort, no problems with respiration noted  Abdomen: Soft, gravid, appropriate for gestational age. Pain/Pressure: Absent     Pelvic:  Cervical exam deferred        Extremities: Normal range of motion.  Edema: None  Mental Status: Normal mood and affect. Normal behavior. Normal judgment and thought content.   Urinalysis:      Assessment and Plan:  Pregnancy: G4P1021 at [redacted]w[redacted]d  1. Encounter for supervision of normal pregnancy in multigravida  2. Hyperthyroidism - clinically stable  3. Poor weight gain of pregnancy, third trimester - no appetite - caloric supplements Rx - referred to nutritionist   4. SGA (small for gestational age) - ultrasound ordered  5. Candida vaginitis Rx: - terconazole (TERAZOL 7) 0.4 % vaginal cream; Place 1 applicator vaginally at bedtime.  Dispense: 45  g; Refill: 0    Preterm labor symptoms and general obstetric precautions including but not limited to vaginal bleeding, contractions, leaking of fluid and fetal movement were reviewed in detail with the patient. Please refer to After Visit Summary for other counseling recommendations.   Return in about 2 weeks (around 09/02/2021) for ROB.   Brock Bad, MD  08/19/21

## 2021-08-19 NOTE — Progress Notes (Signed)
Pt reports fetal movement, denies pain. Pt recently took metronidazole, requests treatment for yeast due to vaginal itching and discharge, declines swab.

## 2021-08-26 ENCOUNTER — Other Ambulatory Visit: Payer: Self-pay

## 2021-08-26 ENCOUNTER — Ambulatory Visit: Payer: Medicaid Other | Attending: Obstetrics and Gynecology

## 2021-08-26 ENCOUNTER — Ambulatory Visit: Payer: Medicaid Other | Admitting: *Deleted

## 2021-08-26 VITALS — BP 111/67 | HR 97

## 2021-08-26 DIAGNOSIS — O99283 Endocrine, nutritional and metabolic diseases complicating pregnancy, third trimester: Secondary | ICD-10-CM | POA: Diagnosis not present

## 2021-08-26 DIAGNOSIS — O26843 Uterine size-date discrepancy, third trimester: Secondary | ICD-10-CM

## 2021-08-26 DIAGNOSIS — B9620 Unspecified Escherichia coli [E. coli] as the cause of diseases classified elsewhere: Secondary | ICD-10-CM

## 2021-08-26 DIAGNOSIS — O09899 Supervision of other high risk pregnancies, unspecified trimester: Secondary | ICD-10-CM | POA: Diagnosis present

## 2021-08-26 DIAGNOSIS — E0590 Thyrotoxicosis, unspecified without thyrotoxic crisis or storm: Secondary | ICD-10-CM

## 2021-08-26 DIAGNOSIS — O09893 Supervision of other high risk pregnancies, third trimester: Secondary | ICD-10-CM

## 2021-08-26 DIAGNOSIS — Z3480 Encounter for supervision of other normal pregnancy, unspecified trimester: Secondary | ICD-10-CM | POA: Insufficient documentation

## 2021-08-26 DIAGNOSIS — N390 Urinary tract infection, site not specified: Secondary | ICD-10-CM | POA: Diagnosis present

## 2021-08-26 DIAGNOSIS — Z141 Cystic fibrosis carrier: Secondary | ICD-10-CM

## 2021-08-26 DIAGNOSIS — Z3A32 32 weeks gestation of pregnancy: Secondary | ICD-10-CM

## 2021-08-27 ENCOUNTER — Encounter: Payer: Self-pay | Admitting: *Deleted

## 2021-08-27 ENCOUNTER — Other Ambulatory Visit: Payer: Self-pay | Admitting: *Deleted

## 2021-08-27 DIAGNOSIS — E0590 Thyrotoxicosis, unspecified without thyrotoxic crisis or storm: Secondary | ICD-10-CM

## 2021-08-27 DIAGNOSIS — O99283 Endocrine, nutritional and metabolic diseases complicating pregnancy, third trimester: Secondary | ICD-10-CM

## 2021-09-02 ENCOUNTER — Encounter: Payer: Self-pay | Admitting: Obstetrics and Gynecology

## 2021-09-02 ENCOUNTER — Other Ambulatory Visit: Payer: Self-pay

## 2021-09-02 ENCOUNTER — Ambulatory Visit (INDEPENDENT_AMBULATORY_CARE_PROVIDER_SITE_OTHER): Payer: Medicaid Other | Admitting: Obstetrics and Gynecology

## 2021-09-02 VITALS — BP 108/71 | HR 97 | Wt 168.0 lb

## 2021-09-02 DIAGNOSIS — E0590 Thyrotoxicosis, unspecified without thyrotoxic crisis or storm: Secondary | ICD-10-CM

## 2021-09-02 DIAGNOSIS — Z3480 Encounter for supervision of other normal pregnancy, unspecified trimester: Secondary | ICD-10-CM

## 2021-09-02 NOTE — Progress Notes (Signed)
   PRENATAL VISIT NOTE  Subjective:  Darlene Morgan is a 25 y.o. [id number] at [redacted]w[redacted]d being seen today for ongoing prenatal care.  She is currently monitored for the following issues for this low-risk pregnancy and has Hyperthyroidism; E. 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  Patient reports no complaints.  Contractions: Irritability. Vag. Bleeding: None.  Movement: Present. Denies leaking of fluid.   The following portions of the patient's history were reviewed and updated as appropriate: allergies, current medications, past family history, past medical history, past social history, past surgical history and problem list.   Objective:   Vitals:   09/02/21 1417  BP: 108/71  Pulse: 97  Weight: 168 lb (76.2 kg)    Fetal Status: Fetal Heart Rate (bpm): 141 Fundal Height: 32 cm Movement: Present     General:  Alert, oriented and cooperative. Patient is in no acute distress.  Skin: Skin is warm and dry. No rash noted.   Cardiovascular: Normal heart rate noted  Respiratory: Normal respiratory effort, no problems with respiration noted  Abdomen: Soft, gravid, appropriate for gestational age.  Pain/Pressure: Present     Pelvic: Cervical exam deferred        Extremities: Normal range of motion.  Edema: None  Mental Status: Normal mood and affect. Normal behavior. Normal judgment and thought content.   Assessment and Plan:  Pregnancy: G4P1021 at [redacted]w[redacted]d 1. Encounter for supervision of normal pregnancy in multigravida Patient is doing well without complaints Plans natural family planning method  2. Hyperthyroidism Now euthyroid without medication Follow up growth ultrasound scheduled  Preterm labor symptoms and general obstetric precautions including but not limited to vaginal bleeding, contractions, leaking of fluid and fetal movement were reviewed in detail with the patient. Please refer to  After Visit Summary for other counseling recommendations.   Return in about 2 weeks (around 09/16/2021) for in person, ROB, Low risk.  Future Appointments  Date Time Provider Department Center  09/10/2021 10:15 AM [facility] [place]  09/23/2021 10:30 AM [facility] NURSE [facility]  09/23/2021 10:45 AM [facility] US6 [facility]    Catalina Antigua, MD

## 2021-09-02 NOTE — Progress Notes (Signed)
ROB 33.4wks Reports "braxton hicks" UC's, counseled on preterm labor symptoms

## 2021-09-10 ENCOUNTER — Telehealth (INDEPENDENT_AMBULATORY_CARE_PROVIDER_SITE_OTHER): Payer: Medicaid Other | Admitting: Registered"

## 2021-09-10 ENCOUNTER — Encounter: Payer: Medicaid Other | Attending: Obstetrics | Admitting: Registered"

## 2021-09-10 DIAGNOSIS — O24419 Gestational diabetes mellitus in pregnancy, unspecified control: Secondary | ICD-10-CM | POA: Insufficient documentation

## 2021-09-10 DIAGNOSIS — O2613 Low weight gain in pregnancy, third trimester: Secondary | ICD-10-CM

## 2021-09-10 DIAGNOSIS — Z3A34 34 weeks gestation of pregnancy: Secondary | ICD-10-CM | POA: Diagnosis not present

## 2021-09-10 NOTE — Progress Notes (Signed)
Virtual Visit via Video Note  I connected with Darlene Morgan on 09/10/21 at 10:15 AM EDT by a video enabled telemedicine application and verified that I am speaking with the correct person using two identifiers.  Location: Patient: home Provider: [facility] [employer]   I discussed the limitations of evaluation and management by telemedicine and the availability of in person appointments. The patient expressed understanding and agreed to proceed.    Medical Nutrition Therapy  Appointment Start time:  76  Appointment End time:  1100 Poor weight gain in pregnancy: [redacted]w[redacted]d  Patient thought this appointment was scheduled to talk about the plan for delivery.  Primary concerns today: Patient was not clear what purpose of visit was, but fine talking about strategies for weight gain.  Referral diagnosis: O26.13 (ICD-10-CM) - Poor weight gain of pregnancy, third trimester Preferred learning style: no preference indicated Learning readiness: ready   NUTRITION ASSESSMENT   Anthropometrics  Wt Readings from Last 3 Encounters:  09/02/21 168 lb (76.2 kg)  08/19/21 165 lb (74.8 kg)  08/08/21 164 lb (74.4 kg)     Clinical Medical Hx: hyperthyroidism Medications: ferrous sulfate 325, prenatal vitamin, phenergan Labs: OGTT 78-53-69 mg/dL Notable Signs/Symptoms: frequent hunger  Lifestyle & Dietary Hx Patient has stress due to recent move and concerns about apartment. Pt states she is in college.   Patient states part of why she may not eat enough is because she is always hungry but doesn't know what she is hungry for and may just eat fruit. Pt reports if she tries to force foods like beef or chicken it makes her feel like throwing up. Patient has reduced craving cheese and not eating it as often.   Patient states she has not picked up her Boost at the store yet.  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 Current average weekly physical activity: not assessed  24-Hr Dietary Recall First Meal: eggs, biscuit, sausage OR cereal Captain Crunch, whole milk, toast, watermelon. Snack: peaches, watermelon Second Meal: 4 [place] tacos, steak Snack: cheetos, yoplait original Third Meal: beef stew Snack: watermelon, cereal, candy Beverages: water, cranberry juice, minute maid fruit punch, whole milk  Estimated Energy Needs Calories: 2500-2800  NUTRITION DIAGNOSIS  NI-1.2 Increased energy expenditure As related to increased caloric need due to hyperthyroidism.  As evidenced by insufficient weight gain in pregnancy.   NUTRITION INTERVENTION  Nutrition education (E-1) on the following topics:  Role of stress in food cravings Strategies to consume more calories High calorie Boost for weight promotion vs other varieties  Handouts Provided Include  High-Calorie, High-Protein Nutrition Therapy  Learning Style & Readiness for Change Teaching method utilized: Visual & Auditory  Demonstrated degree of understanding via: Teach Back  Barriers to learning/adherence to lifestyle change: none  Goals Established by Pt Use high calorie bars to supplement snacks such Perfect Bar (in the refrigerator section), and GoMacro ([employer]) Pick up Boost Plus supplement next time at store Use whipping cream (without sugar) to put on fruit. Avoid drinking water with meals Use tips on handout to get more ideas   Follow Up Instructions:  I discussed the assessment and treatment plan with the patient. The patient was provided an opportunity to ask questions and all were answered. The patient agreed with the plan and demonstrated an understanding of the instructions.   The  patient was advised to call back or seek an in-person evaluation if the symptoms worsen or if the condition fails to improve as anticipated.  I  provided 33 minutes of non-face-to-face time during this encounter.   Carolan Shiver, RD, LDN, CDCES

## 2021-09-12 DIAGNOSIS — O2613 Low weight gain in pregnancy, third trimester: Secondary | ICD-10-CM | POA: Insufficient documentation

## 2021-09-16 ENCOUNTER — Other Ambulatory Visit: Payer: Self-pay

## 2021-09-16 ENCOUNTER — Ambulatory Visit (INDEPENDENT_AMBULATORY_CARE_PROVIDER_SITE_OTHER): Payer: Medicaid Other | Admitting: Advanced Practice Midwife

## 2021-09-16 ENCOUNTER — Other Ambulatory Visit (HOSPITAL_COMMUNITY)
Admission: RE | Admit: 2021-09-16 | Discharge: 2021-09-16 | Disposition: A | Discharge status: Home / Self Care | Payer: Medicaid Other | Source: Ambulatory Visit | Attending: Advanced Practice Midwife | Admitting: Advanced Practice Midwife

## 2021-09-16 VITALS — BP 112/71 | HR 90 | Wt 165.2 lb

## 2021-09-16 DIAGNOSIS — F419 Anxiety disorder, unspecified: Secondary | ICD-10-CM

## 2021-09-16 DIAGNOSIS — Z3480 Encounter for supervision of other normal pregnancy, unspecified trimester: Secondary | ICD-10-CM | POA: Diagnosis present

## 2021-09-16 DIAGNOSIS — E0590 Thyrotoxicosis, unspecified without thyrotoxic crisis or storm: Secondary | ICD-10-CM

## 2021-09-16 DIAGNOSIS — O99340 Other mental disorders complicating pregnancy, unspecified trimester: Secondary | ICD-10-CM

## 2021-09-16 DIAGNOSIS — Z3A35 35 weeks gestation of pregnancy: Secondary | ICD-10-CM

## 2021-09-16 NOTE — Patient Instructions (Signed)
Labor Precautions Reasons to come to MAU at [facility]:  1.  Contractions are  5 minutes apart or less, each last 1 minute, these have been going on for 1-2 hours, and you cannot walk or talk during them 2.  You have a large gush of fluid, or a trickle of fluid that will not stop and you have to wear a pad 3.  You have bleeding that is bright red, heavier than spotting--like menstrual bleeding (spotting can be normal in early labor or after a check of your cervix) 4.  You do not feel the baby moving like he/she normally does o

## 2021-09-16 NOTE — Progress Notes (Signed)
   PRENATAL VISIT NOTE  Subjective:  Darlene Morgan is a 25 y.o. [id number] at [redacted]w[redacted]d being seen today for ongoing prenatal care.  She is currently monitored for the following issues for this low-risk pregnancy and has Hyperthyroidism; E. 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  Patient reports occasional contractions.  Contractions: Irritability. Vag. Bleeding: None.  Movement: Present. Denies leaking of fluid.   The following portions of the patient's history were reviewed and updated as appropriate: allergies, current medications, past family history, past medical history, past social history, past surgical history and problem list.   Objective:   Vitals:   09/16/21 1120  BP: 112/71  Pulse: 90  Weight: 165 lb 3.2 oz (74.9 kg)    Fetal Status:   Fundal Height: 35 cm Movement: Present  Presentation: Vertex  General:  Alert, oriented and cooperative. Patient is in no acute distress.  Skin: Skin is warm and dry. No rash noted.   Cardiovascular: Normal heart rate noted  Respiratory: Normal respiratory effort, no problems with respiration noted  Abdomen: Soft, gravid, appropriate for gestational age.  Pain/Pressure: Absent     Pelvic: Cervical exam performed in the presence of a chaperone Dilation: Closed Effacement (%): Thick    Extremities: Normal range of motion.  Edema: None  Mental Status: Normal mood and affect. Normal behavior. Normal judgment and thought content.   Assessment and Plan:  Pregnancy: G4P1021 at [redacted]w[redacted]d 1. Encounter for supervision of normal pregnancy in multigravida --Anticipatory guidance about next visits/weeks of pregnancy given. --GBS/GC collected today as pt desires eIOL --Pt not favorable today, discussed increased length of labor and risk of C/S with IOL. Pt states understanding. She had IOL last pregnancy and did well.  Experiencing anxiety about onset of labor PROM, etc and desires IOL. --Reviewed labor readiness with patient to start at 37 weeks including the [facility] [employer], evening primrose oil, and raspberry leaf tea.   --Next visit in 2 weeks with midwife  2. Anxiety disorder affecting pregnancy, antepartum --No medications --Anxiety today surrounding labor onset, had IOL last pregnancy, see above  3. Hyperthyroidism --Stable without medications  4. [redacted] weeks gestation of pregnancy   Preterm labor symptoms and general obstetric precautions including but not limited to vaginal bleeding, contractions, leaking of fluid and fetal movement were reviewed in detail with the patient. Please refer to After Visit Summary for other counseling recommendations.   Return in about 2 weeks (around 09/30/2021).  Future Appointments  Date Time Provider Department Center  09/23/2021 10:30 AM [facility]SE [facility]  09/23/2021 10:45 AM [facility] US6 [facility]  09/30/2021 10:55 AM Lodema Hong, Carla Drape, CNM [facility] None    Sharen Counter, CNM

## 2021-09-16 NOTE — Progress Notes (Signed)
Patient presents for ROB. Patient has no concerns today. Patient would like to defer flu vaccine until next visit.

## 2021-09-17 LAB — CERVICOVAGINAL ANCILLARY ONLY
Chlamydia: NEGATIVE
Comment: NEGATIVE
Comment: NORMAL
Neisseria Gonorrhea: NEGATIVE

## 2021-09-20 LAB — CULTURE, BETA STREP (GROUP B ONLY): Strep Gp B Culture: NEGATIVE

## 2021-09-23 ENCOUNTER — Ambulatory Visit: Payer: Medicaid Other | Admitting: *Deleted

## 2021-09-23 ENCOUNTER — Other Ambulatory Visit: Payer: Self-pay

## 2021-09-23 ENCOUNTER — Ambulatory Visit: Payer: Medicaid Other | Attending: Obstetrics

## 2021-09-23 VITALS — BP 113/66 | HR 98

## 2021-09-23 DIAGNOSIS — O26843 Uterine size-date discrepancy, third trimester: Secondary | ICD-10-CM | POA: Diagnosis not present

## 2021-09-23 DIAGNOSIS — Z141 Cystic fibrosis carrier: Secondary | ICD-10-CM | POA: Diagnosis present

## 2021-09-23 DIAGNOSIS — Z3480 Encounter for supervision of other normal pregnancy, unspecified trimester: Secondary | ICD-10-CM | POA: Diagnosis present

## 2021-09-23 DIAGNOSIS — N390 Urinary tract infection, site not specified: Secondary | ICD-10-CM | POA: Insufficient documentation

## 2021-09-23 DIAGNOSIS — O99283 Endocrine, nutritional and metabolic diseases complicating pregnancy, third trimester: Secondary | ICD-10-CM | POA: Insufficient documentation

## 2021-09-23 DIAGNOSIS — Z362 Encounter for other antenatal screening follow-up: Secondary | ICD-10-CM | POA: Diagnosis not present

## 2021-09-23 DIAGNOSIS — B9620 Unspecified Escherichia coli [E. coli] as the cause of diseases classified elsewhere: Secondary | ICD-10-CM | POA: Diagnosis present

## 2021-09-23 DIAGNOSIS — E0590 Thyrotoxicosis, unspecified without thyrotoxic crisis or storm: Secondary | ICD-10-CM | POA: Diagnosis not present

## 2021-09-23 DIAGNOSIS — Z3A36 36 weeks gestation of pregnancy: Secondary | ICD-10-CM

## 2021-09-30 ENCOUNTER — Other Ambulatory Visit: Payer: Self-pay

## 2021-09-30 ENCOUNTER — Ambulatory Visit (INDEPENDENT_AMBULATORY_CARE_PROVIDER_SITE_OTHER): Payer: Medicaid Other

## 2021-09-30 VITALS — BP 117/73 | HR 89 | Wt 168.8 lb

## 2021-09-30 DIAGNOSIS — E0590 Thyrotoxicosis, unspecified without thyrotoxic crisis or storm: Secondary | ICD-10-CM

## 2021-09-30 DIAGNOSIS — F419 Anxiety disorder, unspecified: Secondary | ICD-10-CM

## 2021-09-30 DIAGNOSIS — O99340 Other mental disorders complicating pregnancy, unspecified trimester: Secondary | ICD-10-CM

## 2021-09-30 DIAGNOSIS — Z3A37 37 weeks gestation of pregnancy: Secondary | ICD-10-CM

## 2021-09-30 DIAGNOSIS — Z23 Encounter for immunization: Secondary | ICD-10-CM

## 2021-09-30 DIAGNOSIS — Z3480 Encounter for supervision of other normal pregnancy, unspecified trimester: Secondary | ICD-10-CM

## 2021-09-30 NOTE — Progress Notes (Signed)
   PRENATAL VISIT NOTE  Subjective:  Darlene Morgan is a 25 y.o. [id number] at [redacted]w[redacted]d being seen today for ongoing prenatal care.  She is currently monitored for the following issues for this low-risk pregnancy and has Hyperthyroidism; E. 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  Patient reports no complaints.  Contractions: Irritability. Vag. Bleeding: None.  Movement: Present. Denies leaking of fluid.   The following portions of the patient's history were reviewed and updated as appropriate: allergies, current medications, past family history, past medical history, past social history, past surgical history and problem list.   Objective:   Vitals:   09/30/21 1105  BP: 117/73  Pulse: 89  Weight: 168 lb 12.8 oz (76.6 kg)    Fetal Status: Fetal Heart Rate (bpm): 138 Fundal Height: 38 cm Movement: Present     General:  Alert, oriented and cooperative. Patient is in no acute distress.  Skin: Skin is warm and dry. No rash noted.   Cardiovascular: Normal heart rate noted  Respiratory: Normal respiratory effort, no problems with respiration noted  Abdomen: Soft, gravid, appropriate for gestational age.  Pain/Pressure: Absent     Pelvic: Cervical exam deferred        Extremities: Normal range of motion.  Edema: None  Mental Status: Normal mood and affect. Normal behavior. Normal judgment and thought content.   Assessment and Plan:  Pregnancy: G4P1021 at [redacted]w[redacted]d 1. Encounter for supervision of normal pregnancy in multigravida - Routine OB care - Doing well. No concerns - Reports increase in anxiety that she attributes to preparing for labor. Still considering 39 week IOL. Since patient is still considering, discuss at next visit. Reassurance provided to patient. Offered to speak with Sue Lush, but she declines. - Reviewed [employer], EPO, and RRT.   -  Flu Vaccine QUAD 36+ mos IM (Fluarix, Quad PF)  2. [redacted] weeks gestation of pregnancy   3. Anxiety disorder affecting pregnancy, antepartum - No meds - Anxiety surrounding labor process  4. Hyperthyroidism - Stable   Term labor symptoms and general obstetric precautions including but not limited to vaginal bleeding, contractions, leaking of fluid and fetal movement were reviewed in detail with the patient. Please refer to After Visit Summary for other counseling recommendations.   Return in about 1 week (around 10/07/2021).  Future Appointments  Date Time Provider Department Center  10/08/2021  1:10 PM Leftwich-Kirby, Wilmer Floor, CNM [facility] None     Brand Males, CNM 09/30/21 11:59 AM

## 2021-09-30 NOTE — Progress Notes (Signed)
Pt requests cx, sweeping membranes, and requests IOL at 39 weeks.  Flu vaccine given R Deltoid without complaints.

## 2021-10-02 ENCOUNTER — Telehealth: Payer: Self-pay | Admitting: Advanced Practice Midwife

## 2021-10-02 ENCOUNTER — Other Ambulatory Visit: Payer: Self-pay

## 2021-10-02 NOTE — Telephone Encounter (Signed)
Patient called stating that Misty Stanley stated she had visible yeast on her last exam and would be sending in a pill for treatment.  I do not see this documentation.    Patient is describing to me a watery discharge but states she does not feel her water has broken.  Patient also declines any treatment that is administered vaginally.   Routing to provider for clarification.

## 2021-10-05 ENCOUNTER — Other Ambulatory Visit: Payer: Self-pay | Admitting: Advanced Practice Midwife

## 2021-10-05 DIAGNOSIS — B3731 Acute candidiasis of vulva and vagina: Secondary | ICD-10-CM

## 2021-10-05 MED ORDER — FLUCONAZOLE 150 MG PO TABS: ORAL_TABLET | ORAL | 0 refills | Status: DC

## 2021-10-05 NOTE — Progress Notes (Signed)
Pt with visible signs of yeast infection on exam in office, and reports onset of itching. Will send Diflucan 150 mg to take now and second dose in 3 days.

## 2021-10-08 ENCOUNTER — Other Ambulatory Visit: Payer: Self-pay

## 2021-10-08 ENCOUNTER — Ambulatory Visit (INDEPENDENT_AMBULATORY_CARE_PROVIDER_SITE_OTHER): Payer: Medicaid Other | Admitting: Advanced Practice Midwife

## 2021-10-08 VITALS — BP 121/74 | HR 102 | Wt 170.0 lb

## 2021-10-08 DIAGNOSIS — Z3480 Encounter for supervision of other normal pregnancy, unspecified trimester: Secondary | ICD-10-CM

## 2021-10-08 DIAGNOSIS — Z3A38 38 weeks gestation of pregnancy: Secondary | ICD-10-CM

## 2021-10-08 DIAGNOSIS — E0590 Thyrotoxicosis, unspecified without thyrotoxic crisis or storm: Secondary | ICD-10-CM

## 2021-10-08 NOTE — Progress Notes (Signed)
   PRENATAL VISIT NOTE  Subjective:  Darlene Morgan is a 25 y.o. G4P1021 at [redacted]w[redacted]d being seen today for ongoing prenatal care.  She is currently monitored for the following issues for this low-risk pregnancy and has Hyperthyroidism; E. 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  Patient reports occasional contractions.  Contractions: Irritability. Vag. Bleeding: None.  Movement: Present. Denies leaking of fluid.   The following portions of the patient's history were reviewed and updated as appropriate: allergies, current medications, past family history, past medical history, past social history, past surgical history and problem list.   Objective:   Vitals:   10/08/21 1311  BP: 121/74  Pulse: (!) 102  Weight: 170 lb (77.1 kg)    Fetal Status: Fetal Heart Rate (bpm): 145 Fundal Height: 38 cm Movement: Present  Presentation: Vertex  General:  Alert, oriented and cooperative. Patient is in no acute distress.  Skin: Skin is warm and dry. No rash noted.   Cardiovascular: Normal heart rate noted  Respiratory: Normal respiratory effort, no problems with respiration noted  Abdomen: Soft, gravid, appropriate for gestational age.  Pain/Pressure: Absent     Pelvic: Cervical exam performed in the presence of a chaperone Dilation: 1 Effacement (%): 50 Station: -2  Extremities: Normal range of motion.     Mental Status: Normal mood and affect. Normal behavior. Normal judgment and thought content.   Assessment and Plan:  Pregnancy: G4P1021 at [redacted]w[redacted]d 1. Encounter for supervision of normal pregnancy in multigravida --Anticipatory guidance about next visits/weeks of pregnancy given. --Pt desires elective IOL. Counseled at previous visits and again today about possible longer labor with IOL, increased risk of cesarean. Pt had successful IOL with first pregnancy and  has anxiety about onset of labor or PROM.  Cervix 1/50/-2 today. --IOL form sent with range of dates 11/10-11/12 for IOL --Orders placed  2. Hyperthyroidism --Now euthyroid without medications  3. [redacted] weeks gestation of pregnancy   Term labor symptoms and general obstetric precautions including but not limited to vaginal bleeding, contractions, leaking of fluid and fetal movement were reviewed in detail with the patient. Please refer to After Visit Summary for other counseling recommendations.   No follow-ups on file.  No future appointments.   Sharen Counter, CNM

## 2021-10-10 ENCOUNTER — Inpatient Hospital Stay (HOSPITAL_COMMUNITY): Payer: Medicaid Other

## 2021-10-10 ENCOUNTER — Inpatient Hospital Stay (HOSPITAL_COMMUNITY)
Admission: AD | Admit: 2021-10-10 | Payer: Medicaid Other | Source: Home / Self Care | Admitting: Obstetrics & Gynecology

## 2021-10-13 ENCOUNTER — Other Ambulatory Visit: Payer: Self-pay | Admitting: Advanced Practice Midwife

## 2021-10-14 ENCOUNTER — Inpatient Hospital Stay (EMERGENCY_DEPARTMENT_HOSPITAL)
Admission: AD | Admit: 2021-10-14 | Discharge: 2021-10-14 | Disposition: A | Discharge status: Home / Self Care | Payer: Medicaid Other | Source: Home / Self Care | Attending: Obstetrics and Gynecology | Admitting: Obstetrics and Gynecology

## 2021-10-14 ENCOUNTER — Other Ambulatory Visit: Payer: Self-pay

## 2021-10-14 ENCOUNTER — Inpatient Hospital Stay (HOSPITAL_COMMUNITY): Payer: Medicaid Other

## 2021-10-14 ENCOUNTER — Inpatient Hospital Stay (HOSPITAL_COMMUNITY)
Admission: AD | Admit: 2021-10-14 | Payer: Medicaid Other | Source: Home / Self Care | Admitting: Obstetrics & Gynecology

## 2021-10-14 ENCOUNTER — Encounter (HOSPITAL_COMMUNITY): Payer: Self-pay | Admitting: Obstetrics and Gynecology

## 2021-10-14 DIAGNOSIS — O471 False labor at or after 37 completed weeks of gestation: Secondary | ICD-10-CM | POA: Insufficient documentation

## 2021-10-14 DIAGNOSIS — Z3493 Encounter for supervision of normal pregnancy, unspecified, third trimester: Secondary | ICD-10-CM

## 2021-10-14 DIAGNOSIS — Z3689 Encounter for other specified antenatal screening: Secondary | ICD-10-CM

## 2021-10-14 DIAGNOSIS — Z3A39 39 weeks gestation of pregnancy: Secondary | ICD-10-CM | POA: Insufficient documentation

## 2021-10-14 LAB — POCT FERN TEST: POCT Fern Test: NEGATIVE

## 2021-10-14 NOTE — MAU Provider Note (Signed)
S: Darlene Morgan is a 25 y.o. [phone] at [redacted]w[redacted]d  who presents to MAU today complaining of leaking of fluid since last night, just one episode of a small gush of fluid and then little trickles. Had bad contractions last night which got better throughout the day and then returned this evening around 2000. She denies vaginal bleeding. She endorses contractions. She reports normal fetal movement.    Receives care at [facility], prenatal records reviewed.  O: BP 120/81   Pulse 91   Temp 97.8 F (36.6 C) (Oral)   Resp 18   Ht 5\' 5"  (1.651 m)   Wt 168 lb 14.4 oz (76.6 kg)   LMP 01/10/2021 (Exact Date)   SpO2 100%   BMI 28.11 kg/m  GENERAL: Well-developed, well-nourished female in no acute distress.  HEAD: Normocephalic, atraumatic.  CHEST: Normal effort of breathing, regular heart rate ABDOMEN: Soft, nontender, gravid PELVIC: Normal external female genitalia. Vagina is pink and rugated. Cervix with normal contour, no lesions. Copious, thick/chunky yellow-green discharge (very obvious yeast infection - when I commented, pt stated "oh yeah, they told me that and gave me pills I never took").  No pooling of fluid or watery discharge.   Cervical exam: (unchanged from previous check in the office) Dilation: 2 Effacement (%): 50 Cervical Position: Posterior Station: Ballotable Presentation: Vertex Exam by:: [email or web address], CNM  Fetal Monitoring: reactive Baseline: 150 Variability: moderate Accelerations: 15x15 Decelerations: one variable but no other decelerations Contractions: UI with occasional contractions  Results for orders placed or performed during the hospital encounter of 10/14/21 (from the past 24 hour(s))  Fern Test     Status: None   Collection Time: 10/14/21 10:51 PM  Result Value Ref Range   POCT Fern Test Negative = intact amniotic membranes    A: SIUP at [redacted]w[redacted]d  Membranes intact False labor NST reactive  P: Discharge to home with labor precautions or to return when  called for scheduled IOL tomorrow.  [redacted]w[redacted]d, Bernerd Limbo 10/15/2021 2:53 PM

## 2021-10-14 NOTE — MAU Note (Signed)
Pt reports to MAU c/o LOF since last night with ctx that got more intense around 2000. Pt states she called her midwife and she told her to come to MAU for evaluation. Pt denies any VB. Endorses +FM.

## 2021-10-15 ENCOUNTER — Encounter (HOSPITAL_COMMUNITY): Payer: Self-pay | Admitting: Family Medicine

## 2021-10-15 ENCOUNTER — Inpatient Hospital Stay (HOSPITAL_COMMUNITY)
Admission: AD | Admit: 2021-10-15 | Discharge: 2021-10-17 | DRG: 807 | Disposition: A | Discharge status: Home / Self Care | Payer: Medicaid Other | Attending: Obstetrics and Gynecology | Admitting: Obstetrics and Gynecology

## 2021-10-15 ENCOUNTER — Inpatient Hospital Stay (HOSPITAL_COMMUNITY): Payer: Medicaid Other

## 2021-10-15 DIAGNOSIS — Z141 Cystic fibrosis carrier: Secondary | ICD-10-CM

## 2021-10-15 DIAGNOSIS — E0590 Thyrotoxicosis, unspecified without thyrotoxic crisis or storm: Secondary | ICD-10-CM | POA: Diagnosis present

## 2021-10-15 DIAGNOSIS — Z3480 Encounter for supervision of other normal pregnancy, unspecified trimester: Secondary | ICD-10-CM

## 2021-10-15 DIAGNOSIS — O4202 Full-term premature rupture of membranes, onset of labor within 24 hours of rupture: Secondary | ICD-10-CM | POA: Diagnosis not present

## 2021-10-15 DIAGNOSIS — O99284 Endocrine, nutritional and metabolic diseases complicating childbirth: Secondary | ICD-10-CM | POA: Diagnosis present

## 2021-10-15 DIAGNOSIS — Z3A39 39 weeks gestation of pregnancy: Secondary | ICD-10-CM | POA: Diagnosis not present

## 2021-10-15 DIAGNOSIS — N390 Urinary tract infection, site not specified: Secondary | ICD-10-CM

## 2021-10-15 DIAGNOSIS — O26893 Other specified pregnancy related conditions, third trimester: Secondary | ICD-10-CM | POA: Diagnosis present

## 2021-10-15 DIAGNOSIS — B9620 Unspecified Escherichia coli [E. coli] as the cause of diseases classified elsewhere: Secondary | ICD-10-CM

## 2021-10-15 DIAGNOSIS — Z87891 Personal history of nicotine dependence: Secondary | ICD-10-CM

## 2021-10-15 LAB — CBC
HCT: 34.8 % — ABNORMAL LOW (ref 36.0–46.0)
Hemoglobin: 11.6 g/dL — ABNORMAL LOW (ref 12.0–15.0)
MCH: 28.7 pg (ref 26.0–34.0)
MCHC: 33.3 g/dL (ref 30.0–36.0)
MCV: 86.1 fL (ref 80.0–100.0)
Platelets: 320 10*3/uL (ref 150–400)
RBC: 4.04 MIL/uL (ref 3.87–5.11)
RDW: 13 % (ref 11.5–15.5)
WBC: 6.9 10*3/uL (ref 4.0–10.5)
nRBC: 0 % (ref 0.0–0.2)

## 2021-10-15 LAB — TYPE AND SCREEN
ABO/RH(D): O POS
Antibody Screen: NEGATIVE

## 2021-10-15 MED ORDER — OXYTOCIN-SODIUM CHLORIDE 30-0.9 UT/500ML-% IV SOLN
2.5000 [IU]/h | INTRAVENOUS | Status: DC
  Filled 2021-10-15: qty 500

## 2021-10-15 MED ORDER — ONDANSETRON HCL 4 MG/2ML IJ SOLN: 4.0000 mg | Freq: Four times a day (QID) | INTRAMUSCULAR | Status: DC | PRN

## 2021-10-15 MED ORDER — LACTATED RINGERS IV SOLN: INTRAVENOUS | Status: DC

## 2021-10-15 MED ORDER — PHENYLEPHRINE 40 MCG/ML (10ML) SYRINGE FOR IV PUSH (FOR BLOOD PRESSURE SUPPORT)
80.0000 ug | PREFILLED_SYRINGE | INTRAVENOUS | Status: DC | PRN
  Administered 2021-10-16: 80 ug via INTRAVENOUS

## 2021-10-15 MED ORDER — SOD CITRATE-CITRIC ACID 500-334 MG/5ML PO SOLN: 30.0000 mL | ORAL | Status: DC | PRN

## 2021-10-15 MED ORDER — LIDOCAINE HCL (PF) 1 % IJ SOLN: 30.0000 mL | INTRAMUSCULAR | Status: DC | PRN

## 2021-10-15 MED ORDER — MISOPROSTOL 50MCG HALF TABLET
50.0000 ug | ORAL_TABLET | ORAL | Status: DC
  Administered 2021-10-15 (×2): 50 ug via BUCCAL
  Filled 2021-10-15 (×2): qty 1

## 2021-10-15 MED ORDER — OXYTOCIN BOLUS FROM INFUSION
333.0000 mL | Freq: Once | INTRAVENOUS | Status: AC
  Administered 2021-10-16: 333 mL via INTRAVENOUS

## 2021-10-15 MED ORDER — FENTANYL-BUPIVACAINE-NACL 0.5-0.125-0.9 MG/250ML-% EP SOLN
12.0000 mL/h | EPIDURAL | Status: DC | PRN
  Administered 2021-10-16: 12 mL/h via EPIDURAL
  Filled 2021-10-15: qty 250

## 2021-10-15 MED ORDER — FENTANYL CITRATE (PF) 100 MCG/2ML IJ SOLN
50.0000 ug | INTRAMUSCULAR | Status: DC | PRN
  Administered 2021-10-15: 100 ug via INTRAVENOUS
  Filled 2021-10-15: qty 2

## 2021-10-15 MED ORDER — EPHEDRINE 5 MG/ML INJ: 10.0000 mg | INTRAVENOUS | Status: DC | PRN

## 2021-10-15 MED ORDER — DIPHENHYDRAMINE HCL 50 MG/ML IJ SOLN: 12.5000 mg | INTRAMUSCULAR | Status: DC | PRN

## 2021-10-15 MED ORDER — TERBUTALINE SULFATE 1 MG/ML IJ SOLN: 0.2500 mg | Freq: Once | INTRAMUSCULAR | Status: DC | PRN

## 2021-10-15 MED ORDER — PHENYLEPHRINE 40 MCG/ML (10ML) SYRINGE FOR IV PUSH (FOR BLOOD PRESSURE SUPPORT)
80.0000 ug | PREFILLED_SYRINGE | INTRAVENOUS | Status: DC | PRN
  Filled 2021-10-15: qty 10

## 2021-10-15 MED ORDER — LACTATED RINGERS IV SOLN: 500.0000 mL | Freq: Once | INTRAVENOUS | Status: DC

## 2021-10-15 MED ORDER — FLUCONAZOLE 150 MG PO TABS
150.0000 mg | ORAL_TABLET | Freq: Once | ORAL | Status: AC
  Administered 2021-10-15: 150 mg via ORAL
  Filled 2021-10-15: qty 1

## 2021-10-15 MED ORDER — ACETAMINOPHEN 325 MG PO TABS: 650.0000 mg | ORAL_TABLET | ORAL | Status: DC | PRN

## 2021-10-15 MED ORDER — LACTATED RINGERS IV SOLN: 500.0000 mL | INTRAVENOUS | Status: DC | PRN

## 2021-10-15 NOTE — H&P (Addendum)
OB ADMISSION/ HISTORY & PHYSICAL:  Admission Date: 10/15/2021  2:08 PM  Admit Diagnosis: [redacted] weeks gestation of pregnancy [Z3A.39]    Darlene Morgan is a 25 y.o. female at 81w5dpresenting for Elective IOL. Reports +FM, no leaking of fluid, and denies vaginal bleeding. 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 Reports increased discharge and diagnosed with yeast infection on 11/14 in MAU. EFW 28%tile on 9/26. Expecting baby girl. Significant other William at bedside and supportive.   Prenatal History: [id number]  EDC : 10/17/2021 by LMP Prenatal care at [facility]   Prenatal course complicated by: E. 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 Rubella 1.40 Immune (04/26 1102)  Circumcision Yes if a boy RPR Non Reactive (08/25 1100)   [facility] (04/26 1102)   Support Person WYork RamHCVAb negative  Prenatal Classes   HIV Non Reactive (08/25 1100)     BTL Consent   GBS Negative/-- (10/17 1210) (For PCN allergy, check sensitivities)   VBAC Consent   Pap 10/2020 - NILM/HPV neg           BP Cuff Ordered 03/21/21        Induction  [Valu.Nieves] Orders Entered _0 Foley Y/N           Medical / Surgical History : Past medical history:  Past Medical History:  Diagnosis Date   Asthma    Heart murmur    pt states small    Supervision of normal first  pregnancy, antepartum 01/25/2016    Clinic [facility] Prenatal Labs Dating LMP = 17 wk u/s Blood type: O/POS/-- (02/24 1537)  Genetic Screen 1 Screen:    AFP:     Quad:     NIPS: Antibody:NEG (02/24 1537) Anatomic [place]WNL female Rubella: 2.05 (02/24 1537) GTT Third trimester: 77/63/69 RPR: Non Reactive (07/05 0840)  Flu vaccine  HBsAg: NEGATIVE (02/24 1537)  TDaP vaccine                                               HIV: Non Reactive (07/05 0840)  Baby Food               Breast                                GBS: (For PCN allergy, check sensitivities) Contraception  Depo injections Pap: Circumcision NA  Pediatrician  [facility] [place]  Support Person        Past surgical history:  Past Surgical History:  Procedure Laterality Date   TONSILLECTOMY      Family History:  Family History  Problem Relation Age of Onset   Hypertension Other    Hypertension Maternal Grandmother    Thyroid disease Maternal Grandmother    Thyroid disease Mother    Heart attack Father     Social History:  reports that she has quit smoking. Her smoking use included cigarettes. She has never used smokeless tobacco. She reports current alcohol use. She reports that she does not currently use drugs after having used the following drugs: Marijuana. Frequency: 1.00 time per week. Allergies: Hydrocodone   Current Medications at time of admission:  Medications Prior to Admission  Medication Sig Dispense Refill Last Dose   calcium carbonate (TUMS EX) 750 MG chewable tablet Chew 1 tablet by mouth daily.   Past Month   fluconazole (DIFLUCAN) 150 MG tablet Take one tablet now and 1 in 3 days 2 tablet 0 10/14/2021   Prenatal Vit-Fe Fumarate-FA (MULTIVITAMIN-PRENATAL) 27-0.8 MG TABS tablet Take 1 tablet by mouth daily at 12 noon.   10/14/2021   Blood Pressure Monitoring (BLOOD PRESSURE KIT) DEVI 1 kit by Does not apply route once a week. 1 each 0    ferrous sulfate 325 (65 FE) MG tablet Take 1 tablet (325 mg total) by mouth every  other day. (Patient not taking: No sig reported) 60 tablet 5    promethazine (PHENERGAN) 25 MG tablet Take 1 tablet (25 mg total) by mouth every 6 (six) hours as needed for nausea or vomiting. (Patient not taking: No sig reported) 30 tablet 2      Review of Systems: Review of Systems  Constitutional: Negative.   HENT: Negative.    Respiratory: Negative.    Neurological: Negative.   Psychiatric/Behavioral: Negative.     Physical Exam: Vital signs and nursing notes reviewed.  Patient Vitals for the past 24 hrs:  BP Temp Temp src Pulse Resp  10/15/21 1422 117/85 98.4 F (36.9 C) Oral (!) 118 16     General: AAO x 3, NAD Heart: RRR Lungs:CTAB Abdomen: Gravid, NT Extremities: no edema Genitalia / VE: Exam by:: [employer], SNM   FHR: 135 BPM, moderate variability, + accels, no decels TOCO: irreg contractions   Labs:   Pending T&S, CBC, RPR  No results for input(s): WBC, HGB, HCT, PLT in the last 72 hours.  Assessment:  25 y.o. [id number] at 65w5dhere for Elective IOL with Hyperthyroidism   1. IOL, latent labor 2. FHR category 1 3. Mild Hyperthyroidism- stable no meds 4. GBS Negative  5. Desires Epidural 6. Plans to Breastfeed  Plan:  1. Admit to BS 2. Routine L&D orders 3. Cervical ripening with cytotec/foley/Pit/AROM prn 4. Analgesia/anesthesia PRN  5. Expectant management 6. Anticipate NSVB    AChristiane HaSNM 10/15/2021, 3:10 PM   CNM attestation:  I have seen and examined this patient; I agree with above documentation in the resident's note.   Darlene Morgan is a 25y.o. [id number] for elective IOL  PE: BP 122/68   Pulse 90   Temp 98.4 F (36.9 C) (Oral)   Resp 16   LMP 01/10/2021 (Exact Date)  Gen: calm comfortable, NAD Resp: normal effort, no distress Abd: gravid  ROS, labs, PMH reviewed  Plan: Admit to L&D Plan cervical ripening with cytotec/foley to start, then Pit/AROM prn Anticipate vag del  KMyrtis SerCNM 10/15/2021, 4:49  PM

## 2021-10-16 ENCOUNTER — Inpatient Hospital Stay (HOSPITAL_COMMUNITY): Payer: Medicaid Other | Admitting: Anesthesiology

## 2021-10-16 ENCOUNTER — Encounter (HOSPITAL_COMMUNITY): Payer: Self-pay | Admitting: Family Medicine

## 2021-10-16 DIAGNOSIS — O4202 Full-term premature rupture of membranes, onset of labor within 24 hours of rupture: Secondary | ICD-10-CM

## 2021-10-16 DIAGNOSIS — Z3A39 39 weeks gestation of pregnancy: Secondary | ICD-10-CM

## 2021-10-16 DIAGNOSIS — O99284 Endocrine, nutritional and metabolic diseases complicating childbirth: Secondary | ICD-10-CM

## 2021-10-16 LAB — RPR: RPR Ser Ql: NONREACTIVE

## 2021-10-16 MED ORDER — TETANUS-DIPHTH-ACELL PERTUSSIS 5-2.5-18.5 LF-MCG/0.5 IM SUSY: 0.5000 mL | PREFILLED_SYRINGE | Freq: Once | INTRAMUSCULAR | Status: DC

## 2021-10-16 MED ORDER — BUTORPHANOL TARTRATE 1 MG/ML IJ SOLN
2.0000 mg | Freq: Once | INTRAMUSCULAR | Status: AC
  Administered 2021-10-16: 2 mg via INTRAVENOUS
  Filled 2021-10-16: qty 2

## 2021-10-16 MED ORDER — ACETAMINOPHEN 325 MG PO TABS: 650.0000 mg | ORAL_TABLET | ORAL | Status: DC | PRN

## 2021-10-16 MED ORDER — PRENATAL MULTIVITAMIN CH
1.0000 | ORAL_TABLET | Freq: Every day | ORAL | Status: DC
  Administered 2021-10-16 – 2021-10-17 (×2): 1 via ORAL
  Filled 2021-10-16 (×2): qty 1

## 2021-10-16 MED ORDER — MEDROXYPROGESTERONE ACETATE 150 MG/ML IM SUSP: 150.0000 mg | INTRAMUSCULAR | Status: DC | PRN

## 2021-10-16 MED ORDER — SIMETHICONE 80 MG PO CHEW: 80.0000 mg | CHEWABLE_TABLET | ORAL | Status: DC | PRN

## 2021-10-16 MED ORDER — DIPHENHYDRAMINE HCL 25 MG PO CAPS: 25.0000 mg | ORAL_CAPSULE | Freq: Four times a day (QID) | ORAL | Status: DC | PRN

## 2021-10-16 MED ORDER — LIDOCAINE HCL (PF) 1 % IJ SOLN
INTRAMUSCULAR | Status: DC | PRN
  Administered 2021-10-16: 11 mL via EPIDURAL

## 2021-10-16 MED ORDER — IBUPROFEN 600 MG PO TABS
600.0000 mg | ORAL_TABLET | Freq: Four times a day (QID) | ORAL | Status: DC
  Administered 2021-10-16 – 2021-10-17 (×5): 600 mg via ORAL
  Filled 2021-10-16 (×5): qty 1

## 2021-10-16 MED ORDER — ONDANSETRON HCL 4 MG PO TABS: 4.0000 mg | ORAL_TABLET | ORAL | Status: DC | PRN

## 2021-10-16 MED ORDER — DIBUCAINE (PERIANAL) 1 % EX OINT: 1.0000 "application " | TOPICAL_OINTMENT | CUTANEOUS | Status: DC | PRN

## 2021-10-16 MED ORDER — ONDANSETRON HCL 4 MG/2ML IJ SOLN: 4.0000 mg | INTRAMUSCULAR | Status: DC | PRN

## 2021-10-16 MED ORDER — SENNOSIDES-DOCUSATE SODIUM 8.6-50 MG PO TABS
2.0000 | ORAL_TABLET | Freq: Every day | ORAL | Status: DC
  Administered 2021-10-17: 12:00:00 2 via ORAL
  Filled 2021-10-16: qty 2

## 2021-10-16 MED ORDER — BENZOCAINE-MENTHOL 20-0.5 % EX AERO
1.0000 "application " | INHALATION_SPRAY | CUTANEOUS | Status: DC | PRN
  Administered 2021-10-16: 1 via TOPICAL
  Filled 2021-10-16: qty 56

## 2021-10-16 MED ORDER — WITCH HAZEL-GLYCERIN EX PADS: 1.0000 "application " | MEDICATED_PAD | CUTANEOUS | Status: DC | PRN

## 2021-10-16 MED ORDER — MEASLES, MUMPS & RUBELLA VAC IJ SOLR: 0.5000 mL | Freq: Once | INTRAMUSCULAR | Status: DC

## 2021-10-16 MED ORDER — COCONUT OIL OIL: 1.0000 "application " | TOPICAL_OIL | Status: DC | PRN

## 2021-10-16 NOTE — Anesthesia Procedure Notes (Signed)
Epidural Patient location during procedure: OB Start time: 10/16/2021 2:25 AM End time: 10/16/2021 2:43 AM  Staffing Anesthesiologist: Lowella Curb, MD Performed: anesthesiologist   Preanesthetic Checklist Completed: patient identified, IV checked, site marked, risks and benefits discussed, surgical consent, monitors and equipment checked, pre-op evaluation and timeout performed  Epidural Patient position: sitting Prep: ChloraPrep Patient monitoring: heart rate, cardiac monitor, continuous pulse ox and blood pressure Approach: midline Location: L2-L3 Injection technique: LOR saline  Needle:  Needle type: Tuohy  Needle gauge: 17 G Needle length: 9 cm Needle insertion depth: 5 cm Catheter type: closed end flexible Catheter size: 20 Guage Catheter at skin depth: 9 cm Test dose: negative  Assessment Events: blood not aspirated, injection not painful, no injection resistance, no paresthesia and negative IV test  Additional Notes Reason for block:procedure for pain

## 2021-10-16 NOTE — Lactation Note (Signed)
This note was copied from a baby's chart. Lactation Consultation Note  Patient Name: Darlene Morgan JGGEZ'M Date: 10/16/2021 Reason for consult: Follow-up assessment;Mother's request;Difficult latch;Nipple pain/trauma Age:26 hours  LC assisted with latching infant at breast in football, signs of milk transfer noted. Mom denied any pain with the latch.  Mom to use comfort gels for nipple pain.  All questions answered at the end of the visit.   Maternal Data Has patient been taught Hand Expression?: Yes Does the patient have breastfeeding experience prior to this delivery?: Yes How long did the patient breastfeed?: 1 yr  Feeding Mother's Current Feeding Choice: Breast Milk  LATCH Score Latch: Repeated attempts needed to sustain latch, nipple held in mouth throughout feeding, stimulation needed to elicit sucking reflex.  Audible Swallowing: Spontaneous and intermittent  Type of Nipple: Everted at rest and after stimulation (nipple will evert more with a nipple role and stimulation)  Comfort (Breast/Nipple): Soft / non-tender  Hold (Positioning): Assistance needed to correctly position infant at breast and maintain latch.  LATCH Score: 8   Lactation Tools Discussed/Used Tools: Comfort gels  Interventions Interventions: Breast feeding basics reviewed;Adjust position;Assisted with latch;Support pillows;Skin to skin;Position options;Education;Breast massage;Expressed milk;Hand express;Comfort gels;Breast compression  Discharge WIC Program: Yes  Consult Status Consult Status: Follow-up Date: 10/17/21 Follow-up type: In-patient    [name]  [facility] 10/16/2021, 4:14 PM

## 2021-10-16 NOTE — Lactation Note (Signed)
This note was copied from a baby's chart. Lactation Consultation Note  Patient Name: Girl Vaniah Chambers [id number] Date: 10/16/2021 Reason for consult: L&D Initial assessment Age:26 hours  P2, EX BF Baby latched with lips flanged.   Lactation to follow up on MBU.   Maternal Data Does the patient have breastfeeding experience prior to this delivery?: Yes How long did the patient breastfeed?: one year  Feeding Mother's Current Feeding Choice: Breast Milk  LATCH Score Latch: Grasps breast easily, tongue down, lips flanged, rhythmical sucking.  Audible Swallowing: A few with stimulation  Type of Nipple: Everted at rest and after stimulation  Comfort (Breast/Nipple): Soft / non-tender  Hold (Positioning): No assistance needed to correctly position infant at breast.  LATCH Score: 9    Interventions Interventions: Skin to skin;Education   Consult Status Consult Status: Follow-up from L&D    Dahlia Byes [facility] 10/16/2021, 7:27 AM

## 2021-10-16 NOTE — Discharge Summary (Signed)
Postpartum Discharge Summary  Date of Service updated     Patient Name: Darlene Morgan DOB: November 21, 1996 MRN: [id number]  Date of admission: 10/15/2021 Delivery date:10/16/2021  Delivering provider: Patriciaann Clan  Date of discharge: 10/17/2021  Admitting diagnosis: [redacted] weeks gestation of pregnancy [Z3A.39] Intrauterine pregnancy: [redacted]w[redacted]d    Secondary diagnosis:  Active Problems:   Hyperthyroidism   Suspected carrier of cystic fibrosis   Vaginal delivery  Additional problems: None    Discharge diagnosis: Term Pregnancy Delivered                                              Post partum procedures: None Augmentation: Cytotec Complications: None  Hospital course: Induction of Labor With Vaginal Delivery   25y.o. yo [id number] admitted to the hospital 10/15/2021 for induction of labor.  Indication for induction: Elective.  Patient had an uncomplicated labor course as follows: Membrane Rupture Time/Date: 9:30 PM ,10/15/2021   Delivery Method:Vaginal, Spontaneous  Episiotomy: None  Lacerations:  Labial  Details of delivery can be found in separate delivery note.  Patient had a routine postpartum course. Patient is discharged home 10/17/21.  Newborn Data: Birth date:10/16/2021  Birth time:6:26 AM  Gender:Female  Living status:Living  Apgars:9 ,9  Weight:3105 g   Magnesium Sulfate received: No BMZ received: No Rhophylac:No MMR:No T-DaP:Given prenatally Flu: No Transfusion:No  Physical exam  Vitals:   10/16/21 0915 10/16/21 1315 10/16/21 1745 10/17/21 0507  BP: 114/79 117/79 114/80 110/78  Pulse: 83 71 79 84  Resp: '16 18 18 18  ' Temp: 99.1 F (37.3 C) 98.3 F (36.8 C) 98.4 F (36.9 C) 98 F (36.7 C)  TempSrc: Oral Oral Oral Oral  SpO2: 99% 99% 99% 99%   General: alert, cooperative, and no distress Lochia: appropriate Uterine Fundus: firm Incision: N/A DVT Evaluation: No significant calf/ankle edema. Labs: Lab Results  Component Value Date    WBC 6.9 10/15/2021   HGB 11.6 (L) 10/15/2021   HCT 34.8 (L) 10/15/2021   MCV 86.1 10/15/2021   PLT 320 10/15/2021   CMP Latest Ref Rng & Units 02/17/2017  Glucose 65 - 99 mg/dL 110(H)  BUN 6 - 20 mg/dL 9  Creatinine 0.44 - 1.00 mg/dL 0.89  Sodium 135 - 145 mmol/L 138  Potassium 3.5 - 5.1 mmol/L 3.8  Chloride 101 - 111 mmol/L 106  CO2 22 - 32 mmol/L 23  Calcium 8.9 - 10.3 mg/dL 9.5  Total Protein 6.5 - 8.1 g/dL 7.0  Total Bilirubin 0.3 - 1.2 mg/dL 0.8  Alkaline Phos 38 - 126 U/L 58  AST 15 - 41 U/L 19  ALT 14 - 54 U/L 16   Edinburgh Score: Edinburgh Postnatal Depression Scale Screening Tool 10/16/2021  I have been able to laugh and see the funny side of things. (No Data)     After visit meds:  Allergies as of 10/17/2021       Reactions   Hydrocodone Itching        Medication List     STOP taking these medications    fluconazole 150 MG tablet Commonly known as: DIFLUCAN   promethazine 25 MG tablet Commonly known as: PHENERGAN       TAKE these medications    acetaminophen 325 MG tablet Commonly known as: Tylenol Take 2 tablets (650 mg total) by mouth every 4 (four)  hours as needed (for pain scale < 4).   Blood Pressure Kit Devi 1 kit by Does not apply route once a week.   calcium carbonate 750 MG chewable tablet Commonly known as: TUMS EX Chew 1 tablet by mouth daily.   ferrous sulfate 325 (65 FE) MG tablet Take 1 tablet (325 mg total) by mouth every other day.   ibuprofen 600 MG tablet Commonly known as: ADVIL Take 1 tablet (600 mg total) by mouth every 6 (six) hours as needed.   multivitamin-prenatal 27-0.8 MG Tabs tablet Take 1 tablet by mouth daily at 12 noon.   norethindrone 0.35 MG tablet Commonly known as: Ortho Micronor Take 1 tablet (0.35 mg total) by mouth daily. Start taking on: October 31, 2021         Discharge home in stable condition Infant Feeding: Breast Infant Disposition: Hopefully home with mother Discharge  instruction: per After Visit Summary and Postpartum booklet. Activity: Advance as tolerated. Pelvic rest for 6 weeks.  Diet: routine diet Future Appointments: Future Appointments  Date Time Provider [place]  11/28/2021  9:15 AM Luvenia Redden, PA-C [facility] None   Follow up Visit:  Message sent to [facility] by Dr Higinio Plan on 10/16/2021:   Please schedule this patient for a In person postpartum visit in 6 weeks with the following provider: Any provider. Additional Postpartum F/U: None   Low risk pregnancy complicated by: subclinical hyperthyroid  Delivery mode:  Vaginal, Spontaneous  Anticipated Birth Control:  Unsure >discharged home with POPs    10/17/2021 Patriciaann Clan, DO

## 2021-10-16 NOTE — Plan of Care (Signed)

## 2021-10-16 NOTE — Anesthesia Preprocedure Evaluation (Signed)
Anesthesia Evaluation  Patient identified by MRN, date of birth, ID band Patient awake    Reviewed: Allergy & Precautions, H&P , NPO status , Patient's Chart, lab work & pertinent test results  Airway Mallampati: II  TM Distance: >3 FB Neck ROM: full    Dental  (+) Teeth Intact   Pulmonary neg pulmonary ROS, former smoker,    breath sounds clear to auscultation       Cardiovascular negative cardio ROS   Rhythm:regular Rate:Normal     Neuro/Psych negative neurological ROS  negative psych ROS   GI/Hepatic negative GI ROS, Neg liver ROS,   Endo/Other  negative endocrine ROS  Renal/GU negative Renal ROS  negative genitourinary   Musculoskeletal negative musculoskeletal ROS (+)   Abdominal   Peds negative pediatric ROS (+)  Hematology negative hematology ROS (+)   Anesthesia Other Findings       Reproductive/Obstetrics negative OB ROS                             Anesthesia Physical  Anesthesia Plan  ASA: II  Anesthesia Plan: Epidural   Post-op Pain Management:    Induction:   PONV Risk Score and Plan:   Airway Management Planned:   Additional Equipment:   Intra-op Plan:   Post-operative Plan:   Informed Consent: I have reviewed the patients History and Physical, chart, labs and discussed the procedure including the risks, benefits and alternatives for the proposed anesthesia with the patient or authorized representative who has indicated his/her understanding and acceptance.     Dental Advisory Given  Plan Discussed with:   Anesthesia Plan Comments: (Labs checked- platelets confirmed with RN in room. Fetal heart tracing, per RN, reported to be stable enough for sitting procedure. Discussed epidural, and patient consents to the procedure:  included risk of possible headache,backache, failed block, allergic reaction, and nerve injury. This patient was asked if she had  any questions or concerns before the procedure started.)        Anesthesia Quick Evaluation

## 2021-10-16 NOTE — Lactation Note (Signed)
This note was copied from a baby's chart. Lactation Consultation Note  Patient Name: Darlene Morgan [id number] Date: 10/16/2021 Reason for consult: Mother's request;Follow-up assessment;Term Age:26 hours  Mom called out for assist; Mom with c/o nipple pain. Mom found some relief from lowering mandible. Mom then wanted infant off the breast, which I facilitated.   Mom requested something for nipple soreness. When I returned to room, I placed Comfort Gels on her. Infant was sleeping; Mom will call for assist when ready for [place] to return.    Lurline Hare [facility] 10/16/2021, 1:30 PM

## 2021-10-16 NOTE — Anesthesia Postprocedure Evaluation (Signed)
Anesthesia Post Note  Patient: Darlene Morgan  Procedure(s) Performed: AN AD HOC LABOR EPIDURAL     Patient location during evaluation: Mother Baby Anesthesia Type: Epidural Level of consciousness: awake and alert Pain management: pain level controlled Vital Signs Assessment: post-procedure vital signs reviewed and stable Respiratory status: spontaneous breathing, nonlabored ventilation and respiratory function stable Cardiovascular status: stable Postop Assessment: no headache, no backache and epidural receding Anesthetic complications: no   No notable events documented.  Last Vitals:  Vitals:   10/16/21 0915 10/16/21 1315  BP: 114/79 117/79  Pulse: 83 71  Resp: 16 18  Temp: 37.3 C 36.8 C  SpO2: 99% 99%    Last Pain:  Vitals:   10/16/21 1315  TempSrc: Oral  PainSc: 8    Pain Goal: Patients Stated Pain Goal: 2 (10/16/21 1315)                 Marrion Coy

## 2021-10-16 NOTE — Progress Notes (Signed)
Labor Progress Note Darlene Morgan is a 25 y.o. [id number] at [redacted]w[redacted]d presented for eIOL.   S: Recently had SROM with clear fluid around 2230, contracting on her own. Getting quite uncomfortable.   O:  BP 127/73   Pulse 82   Temp 98.2 F (36.8 C) (Oral)   Resp 16   LMP 01/10/2021 (Exact Date)  EFM: 130/mod/15x15/none Ctx every 2-3 min   CVE: Dilation: 4 Effacement (%): 50 Cervical Position: Posterior Station: -3 Presentation: Vertex Exam by:: Mary [place] Johnson, RN   A&P: 25 y.o. [id number] [redacted]w[redacted]d  #Labor: Progressing well now with expectant management, s/p cytotec x2. Will continue to monitor, can add on pit 2x2 in the future if needed.  #Pain: Will add on fentanyl PRN, can consider stadol if ineffective  #FWB: Cat 1  #GBS negative   Allayne Stack, DO 12:07 AM

## 2021-10-17 MED ORDER — ACETAMINOPHEN 325 MG PO TABS: 650.0000 mg | ORAL_TABLET | ORAL | Status: DC | PRN

## 2021-10-17 MED ORDER — NORETHINDRONE 0.35 MG PO TABS: 1.0000 | ORAL_TABLET | Freq: Every day | ORAL | 2 refills | Status: DC

## 2021-10-17 MED ORDER — IBUPROFEN 600 MG PO TABS: 600.0000 mg | ORAL_TABLET | Freq: Four times a day (QID) | ORAL | 0 refills | Status: DC | PRN

## 2021-10-17 NOTE — Progress Notes (Signed)
Post Partum Day 1 Subjective: She is feeling well and does not endorse any pain. She is ambulating and tolerating oral intake without difficulty. She would like to go home today but her baby is being monitored for bilirubin levels.  Objective: Blood pressure 110/78, pulse 84, temperature 98 F (36.7 C), temperature source Oral, resp. rate 18, last menstrual period 01/10/2021, SpO2 99 %, unknown if currently breastfeeding.  Physical Exam:  General: alert, cooperative, and no distress Lochia: appropriate Uterine Fundus: firm Incision: none DVT Evaluation: No evidence of DVT seen on physical exam.  Recent Labs    10/15/21 1500  HGB 11.6*  HCT 34.8*    Assessment/Plan: Darlene Morgan is a 25 year old G4P2022 on PPD#1 who is recovering well and meeting all goals. -Oral contraception moving forward -Stable for discharge   LOS: 2 days   Val Eagle 10/17/2021, 7:09 AM

## 2021-10-29 ENCOUNTER — Telehealth (HOSPITAL_COMMUNITY): Payer: Self-pay | Admitting: *Deleted

## 2021-10-29 NOTE — Telephone Encounter (Signed)
Attempted Hospital Discharge Follow-Up Call.  Busy signal.  Unable to leave message.

## 2021-11-04 ENCOUNTER — Encounter: Payer: Self-pay | Admitting: *Deleted

## 2021-11-04 ENCOUNTER — Telehealth: Payer: Self-pay | Admitting: *Deleted

## 2021-11-04 NOTE — Telephone Encounter (Signed)
TC to discuss missing information on [facility] pregnancy accommodations forms. No answer. No VM. Rings fast busy then disconnects. Forms were faxed incomplete. Patient messaged via MyChart.

## 2021-11-05 ENCOUNTER — Telehealth: Payer: Self-pay

## 2021-11-05 NOTE — Telephone Encounter (Signed)
Attempted to make contact with pt to make her aware her accomodation form has been completed but we're unable to fax the form.

## 2021-11-07 ENCOUNTER — Other Ambulatory Visit: Payer: Self-pay | Admitting: Obstetrics

## 2021-11-28 ENCOUNTER — Ambulatory Visit: Payer: Medicaid Other | Admitting: Medical

## 2022-02-05 ENCOUNTER — Telehealth (INDEPENDENT_AMBULATORY_CARE_PROVIDER_SITE_OTHER): Payer: Medicaid Other | Admitting: Obstetrics and Gynecology

## 2022-02-05 DIAGNOSIS — Z91199 Patient's noncompliance with other medical treatment and regimen due to unspecified reason: Secondary | ICD-10-CM

## 2022-02-05 NOTE — Progress Notes (Signed)
   Complete physical exam  Patient: Darlene Darlene   DOB: 09/20/2000   26 y.o. Female  MRN: [id number]  Subjective:    No chief complaint on file.   Darlene Darlene is a 26 y.o. female who presents today for a complete physical exam. She reports consuming a {diet types:17450} diet. {types:19826} She generally feels {DESC; WELL/FAIRLY WELL/POORLY:18703}. She reports sleeping {DESC; WELL/FAIRLY WELL/POORLY:18703}. She {does/does not:200015} have additional problems to discuss today.    Most recent fall risk assessment:    05/28/2022   10:42 AM  Fall Risk   Falls in the past year? 0  Number falls in past yr: 0  Injury with Fall? 0  Risk for fall due to : No Fall Risks  Follow up Falls evaluation completed     Most recent depression screenings:    05/28/2022   10:42 AM 04/18/2021   10:46 AM  PHQ 2/9 Scores  PHQ - 2 Score 0 0  PHQ- 9 Score 5     {VISON DENTAL STD PSA (Optional):27386}  {History (Optional):23778}  Patient Care Team: Jessup, Joy, NP as PCP - General (Nurse Practitioner)   Outpatient Medications Prior to Visit  Medication Sig   fluticasone (FLONASE) 50 MCG/ACT nasal spray Place 2 sprays into both nostrils in the morning and at bedtime. After 7 days, reduce to once daily.   norgestimate-ethinyl estradiol (SPRINTEC 28) 0.25-35 MG-MCG tablet Take 1 tablet by mouth daily.   Nystatin POWD Apply liberally to affected area 2 times per day   spironolactone (ALDACTONE) 100 MG tablet Take 1 tablet (100 mg total) by mouth daily.   No facility-administered medications prior to visit.    ROS        Objective:     There were no vitals taken for this visit. {Vitals History (Optional):23777}  Physical Exam   No results found for any visits on 07/03/22. {Show previous labs (optional):23779}    Assessment & Plan:    Routine Health Maintenance and Physical Exam  Immunization History  Administered Date(s) Administered   DTaP 12/04/1999, 01/30/2000,  04/09/2000, 12/24/2000, 07/09/2004   Hepatitis A 05/05/2008, 05/11/2009   Hepatitis B 09/21/1999, 10/29/1999, 04/09/2000   HiB (PRP-OMP) 12/04/1999, 01/30/2000, 04/09/2000, 12/24/2000   IPV 12/04/1999, 01/30/2000, 09/28/2000, 07/09/2004   Influenza,inj,Quad PF,6+ Mos 08/11/2014   Influenza-Unspecified 11/10/2012   MMR 09/28/2001, 07/09/2004   Meningococcal Polysaccharide 05/10/2012   Pneumococcal Conjugate-13 12/24/2000   Pneumococcal-Unspecified 04/09/2000, 06/23/2000   Tdap 05/10/2012   Varicella 09/28/2000, 05/05/2008    Health Maintenance  Topic Date Due   HIV Screening  Never done   Hepatitis C Screening  Never done   INFLUENZA VACCINE  07/01/2022   PAP-Cervical Cytology Screening  07/03/2022 (Originally 09/19/2020)   PAP SMEAR-Modifier  07/03/2022 (Originally 09/19/2020)   TETANUS/TDAP  07/03/2022 (Originally 05/10/2022)   HPV VACCINES  Discontinued   COVID-19 Vaccine  Discontinued    Discussed health benefits of physical activity, and encouraged her to engage in regular exercise appropriate for her age and condition.  Problem List Items Addressed This Visit   None Visit Diagnoses     Annual physical exam    -  Primary   Cervical cancer screening       Need for Tdap vaccination          No follow-ups on file.     Joy Jessup, NP   

## 2022-05-07 ENCOUNTER — Encounter: Payer: Self-pay | Admitting: Obstetrics

## 2022-05-07 ENCOUNTER — Other Ambulatory Visit (HOSPITAL_COMMUNITY)
Admission: RE | Admit: 2022-05-07 | Discharge: 2022-05-07 | Disposition: A | Discharge status: Home / Self Care | Payer: Medicaid Other | Source: Ambulatory Visit | Attending: Obstetrics | Admitting: Obstetrics

## 2022-05-07 ENCOUNTER — Ambulatory Visit (INDEPENDENT_AMBULATORY_CARE_PROVIDER_SITE_OTHER): Payer: Medicaid Other | Admitting: Obstetrics

## 2022-05-07 VITALS — BP 116/79 | HR 103 | Wt 157.1 lb

## 2022-05-07 DIAGNOSIS — N760 Acute vaginitis: Secondary | ICD-10-CM

## 2022-05-07 DIAGNOSIS — Z113 Encounter for screening for infections with a predominantly sexual mode of transmission: Secondary | ICD-10-CM | POA: Diagnosis present

## 2022-05-07 DIAGNOSIS — N946 Dysmenorrhea, unspecified: Secondary | ICD-10-CM

## 2022-05-07 DIAGNOSIS — N898 Other specified noninflammatory disorders of vagina: Secondary | ICD-10-CM | POA: Insufficient documentation

## 2022-05-07 DIAGNOSIS — Z3009 Encounter for other general counseling and advice on contraception: Secondary | ICD-10-CM

## 2022-05-07 DIAGNOSIS — B9689 Other specified bacterial agents as the cause of diseases classified elsewhere: Secondary | ICD-10-CM

## 2022-05-07 DIAGNOSIS — Z01419 Encounter for gynecological examination (general) (routine) without abnormal findings: Secondary | ICD-10-CM

## 2022-05-07 MED ORDER — IBUPROFEN 800 MG PO TABS: 800.0000 mg | ORAL_TABLET | Freq: Three times a day (TID) | ORAL | 5 refills | Status: DC | PRN

## 2022-05-07 MED ORDER — METRONIDAZOLE 500 MG PO TABS: 500.0000 mg | ORAL_TABLET | Freq: Two times a day (BID) | ORAL | 2 refills | Status: DC

## 2022-05-07 NOTE — Progress Notes (Signed)
Pt is in the office for STD testing Reports vaginal discharge and odor Last pap 10/11/2020 Declines BC LMP 04/17/2022

## 2022-05-07 NOTE — Progress Notes (Signed)
Subjective:        Darlene Morgan is a 26 y.o. female here for a routine exam.  Current complaints: Vaginal discharge with odor.  She states that she had a sore on her vulva recently that was painful but has healed.  Personal health questionnaire:  Is patient Darlene Morgan, have a family history of breast and/or ovarian cancer: no Is there a family history of uterine cancer diagnosed at age < 32, gastrointestinal cancer, urinary tract cancer, family member who is a Field seismologist syndrome-associated carrier: no Is the patient overweight and hypertensive, family history of diabetes, personal history of gestational diabetes, preeclampsia or PCOS: no Is patient over 31, have PCOS,  family history of premature CHD under age 42, diabetes, smoke, have hypertension or peripheral artery disease:  no At any time, has a partner hit, kicked or otherwise hurt or frightened you?: no Over the past 2 weeks, have you felt down, depressed or hopeless?: no Over the past 2 weeks, have you felt little interest or pleasure in doing things?:no   Gynecologic History Patient's last menstrual period was 04/17/2022. Contraception: none Last Pap: 2021. Results were: normal Last mammogram: n/a. Results were: n/a  Obstetric History OB History  Gravida Para Term Preterm AB Living  _0 0 2 2  SAB IAB Ectopic Multiple Live Births  0 2 0 0 2    # Outcome Date GA Lbr Len/2nd Weight Sex Delivery Anes PTL Lv  4 Term 10/16/21 [id number] / 00:16 6 lb 13.5 oz (3.105 kg) F Vag-Spont EPI  LIV  3 IAB 12/01/18          2 IAB 07/01/18 635w5d       1 Term 09/05/16 [id number]:54 / 01:20 7 lb 0.2 oz (3.181 kg) F Vag-Spont EPI  LIV    Past Medical History:  Diagnosis Date   Asthma    Heart murmur    pt states small    Supervision of normal first pregnancy, antepartum 01/25/2016    Clinic [facility] Prenatal Labs Dating LMP = 17 wk u/s Blood type: O/POS/-- (02/24 1537)  Genetic Screen 1 Screen:    AFP:     Quad:      NIPS: Antibody:NEG (02/24 1537) Anatomic US [place] female Rubella: 2.05 (02/24 1537) GTT Third trimester: 77/63/69 RPR: Non Reactive (07/05 0840)  Flu vaccine  HBsAg: NEGATIVE (02/24 1537)  TDaP vaccine                                               HIV: Non Reactive (07/05 0840)  Baby Food               Breast                                GBS: (For PCN allergy, check sensitivities) Contraception  Depo injections Pap: Circumcision NA  Pediatrician  [facility] [place]  Support Person        Past Surgical History:  Procedure Laterality Date   TONSILLECTOMY       Current Outpatient Medications:    ibuprofen (ADVIL) 800 MG tablet, Take 1 tablet (800 mg total) by mouth every 8 (eight) hours as needed., Disp: 30 tablet, Rfl: 5   metroNIDAZOLE (FLAGYL) 500 MG tablet, Take 1 tablet (500  mg total) by mouth 2 (two) times daily., Disp: 14 tablet, Rfl: 2   acetaminophen (TYLENOL) 325 MG tablet, Take 2 tablets (650 mg total) by mouth every 4 (four) hours as needed (for pain scale < 4). (Patient not taking: Reported on 05/07/2022), Disp: , Rfl:    Blood Pressure Monitoring (BLOOD PRESSURE KIT) DEVI, 1 kit by Does not apply route once a week. (Patient not taking: Reported on 05/07/2022), Disp: 1 each, Rfl: 0   calcium carbonate (TUMS EX) 750 MG chewable tablet, Chew 1 tablet by mouth daily. (Patient not taking: Reported on 05/07/2022), Disp: , Rfl:    ferrous sulfate 325 (65 FE) MG tablet, Take 1 tablet (325 mg total) by mouth every other day. (Patient not taking: Reported on 09/16/2021), Disp: 60 tablet, Rfl: 5   norethindrone (ORTHO MICRONOR) 0.35 MG tablet, Take 1 tablet (0.35 mg total) by mouth daily. (Patient not taking: Reported on 05/07/2022), Disp: 28 tablet, Rfl: 2   Prenatal Vit-Fe Fumarate-FA (MULTIVITAMIN-PRENATAL) 27-0.8 MG TABS tablet, Take 1 tablet by mouth daily at 12 noon. (Patient not taking: Reported on 05/07/2022), Disp: , Rfl:  Allergies  Allergen Reactions   Hydrocodone Itching    Social History    Tobacco Use   Smoking status: Former    Types: Cigarettes   Smokeless tobacco: Never  Substance Use Topics   Alcohol use: Yes    Alcohol/week: 0.0 standard drinks    Family History  Problem Relation Age of Onset   Hypertension Other    Hypertension Maternal Grandmother    Thyroid disease Maternal Grandmother    Thyroid disease Mother    Heart attack Father       Review of Systems  Constitutional: negative for fatigue and weight loss Respiratory: negative for cough and wheezing Cardiovascular: negative for chest pain, fatigue and palpitations Gastrointestinal: negative for abdominal pain and change in bowel habits Musculoskeletal:negative for myalgias Neurological: negative for gait problems and tremors Behavioral/Psych: negative for abusive relationship, depression Endocrine: negative for temperature intolerance    Genitourinary: positive for vaginal discharge with odor.  negative for abnormal menstrual periods, genital lesions, hot flashes, sexual problems  Integument/breast: negative for breast lump, breast tenderness, nipple discharge and skin lesion(s)    Objective:       BP 116/79   Pulse (!) 103   Wt 157 lb 1.6 oz (71.3 kg)   LMP 04/17/2022   Breastfeeding No   BMI 26.14 kg/m  General:   Alert and no distress  Skin:   no rash or abnormalities  Lungs:   clear to auscultation bilaterally  Heart:   regular rate and rhythm, S1, S2 normal, no murmur, click, rub or gallop  Breasts:   normal without suspicious masses, skin or nipple changes or axillary nodes  Abdomen:  normal findings: no organomegaly, soft, non-tender and no hernia  Pelvis:  External genitalia: normal general appearance Urinary system: urethral meatus normal and bladder without fullness, nontender Vaginal: normal without tenderness, induration or masses Cervix: normal appearance Adnexa: normal bimanual exam Uterus: anteverted and non-tender, normal size   Lab Review Urine pregnancy  test Labs reviewed yes Radiologic studies reviewed no  I have spent a total of 20 minutes of face-to-face and non-face-to-face time, excluding clinical staff time, reviewing notes and preparing to see patient, ordering tests and/or medications, and counseling the patient.   Assessment:    1. Encounter for gynecological examination with Papanicolaou smear of cervix Rx: - Cytology - PAP( [place])  2. Vaginal discharge Rx: -  Cervicovaginal ancillary only( [place])  3. BV (bacterial vaginosis) Rx: - metroNIDAZOLE (FLAGYL) 500 MG tablet; Take 1 tablet (500 mg total) by mouth 2 (two) times daily.  Dispense: 14 tablet; Refill: 2  4. Screening for STD (sexually transmitted disease) Rx: - HIV antibody (with reflex) - RPR - Hepatitis B Surface AntiGEN - Hepatitis C Antibody  5. Dysmenorrhea Rx: - ibuprofen (ADVIL) 800 MG tablet; Take 1 tablet (800 mg total) by mouth every 8 (eight) hours as needed.  Dispense: 30 tablet; Refill: 5  6. Encounter for other general counseling or advice on contraception - declines contraception.  Condoms recommended for STD prevention    Plan:    Education reviewed: calcium supplements, depression evaluation, low fat, low cholesterol diet, safe sex/STD prevention, self breast exams, and weight bearing exercise. Contraception: none. Follow up in: 1 year.   Meds ordered this encounter  Medications   metroNIDAZOLE (FLAGYL) 500 MG tablet    Sig: Take 1 tablet (500 mg total) by mouth 2 (two) times daily.    Dispense:  14 tablet    Refill:  2   ibuprofen (ADVIL) 800 MG tablet    Sig: Take 1 tablet (800 mg total) by mouth every 8 (eight) hours as needed.    Dispense:  30 tablet    Refill:  5   Orders Placed This Encounter  Procedures   HIV antibody (with reflex)   RPR   Hepatitis B Surface AntiGEN   Hepatitis C Antibody     Shelly Bombard, MD 05/07/2022 2:44 PM

## 2022-05-08 LAB — HEPATITIS B SURFACE ANTIGEN: Hepatitis B Surface Ag: NEGATIVE

## 2022-05-08 LAB — RPR: RPR Ser Ql: NONREACTIVE

## 2022-05-08 LAB — HEPATITIS C ANTIBODY: Hep C Virus Ab: NONREACTIVE

## 2022-05-08 LAB — HIV ANTIBODY (ROUTINE TESTING W REFLEX): HIV Screen 4th Generation wRfx: NONREACTIVE

## 2022-05-09 ENCOUNTER — Encounter: Payer: Self-pay | Admitting: Obstetrics

## 2022-05-09 ENCOUNTER — Other Ambulatory Visit: Payer: Self-pay | Admitting: Obstetrics

## 2022-05-09 DIAGNOSIS — A749 Chlamydial infection, unspecified: Secondary | ICD-10-CM

## 2022-05-09 DIAGNOSIS — B3731 Acute candidiasis of vulva and vagina: Secondary | ICD-10-CM

## 2022-05-09 LAB — CERVICOVAGINAL ANCILLARY ONLY
Bacterial Vaginitis (gardnerella): POSITIVE — AB
Candida Glabrata: NEGATIVE
Candida Vaginitis: POSITIVE — AB
Chlamydia: POSITIVE — AB
Comment: NEGATIVE
Comment: NEGATIVE
Comment: NEGATIVE
Comment: NEGATIVE
Comment: NEGATIVE
Comment: NORMAL
Neisseria Gonorrhea: NEGATIVE
Trichomonas: NEGATIVE

## 2022-05-09 LAB — CYTOLOGY - PAP: Diagnosis: NEGATIVE

## 2022-05-09 MED ORDER — DOXYCYCLINE HYCLATE 100 MG PO CAPS: 100.0000 mg | ORAL_CAPSULE | Freq: Two times a day (BID) | ORAL | 0 refills | Status: DC

## 2022-05-09 MED ORDER — FLUCONAZOLE 150 MG PO TABS: 150.0000 mg | ORAL_TABLET | Freq: Once | ORAL | 0 refills | Status: AC

## 2022-05-12 ENCOUNTER — Telehealth: Payer: Self-pay

## 2022-05-12 ENCOUNTER — Other Ambulatory Visit: Payer: Self-pay

## 2022-05-12 NOTE — Telephone Encounter (Signed)
Pt informed of results.

## 2022-05-12 NOTE — Telephone Encounter (Signed)
-----   Message from Brock Bad, MD sent at 05/09/2022  1:49 PM EDT ----- Doxycycline Rx for Chlamydia Flagyl Rx for BV Diflucan Rx for BV

## 2022-05-20 ENCOUNTER — Ambulatory Visit: Payer: Medicaid Other | Admitting: Obstetrics

## 2022-05-29 ENCOUNTER — Other Ambulatory Visit (HOSPITAL_COMMUNITY)
Admission: RE | Admit: 2022-05-29 | Discharge: 2022-05-29 | Disposition: A | Discharge status: Home / Self Care | Payer: Medicaid Other | Source: Ambulatory Visit | Attending: Medical | Admitting: Medical

## 2022-05-29 ENCOUNTER — Encounter: Payer: Self-pay | Admitting: Family Medicine

## 2022-05-29 ENCOUNTER — Ambulatory Visit (INDEPENDENT_AMBULATORY_CARE_PROVIDER_SITE_OTHER): Payer: Medicaid Other | Admitting: Family Medicine

## 2022-05-29 DIAGNOSIS — Z113 Encounter for screening for infections with a predominantly sexual mode of transmission: Secondary | ICD-10-CM

## 2022-05-29 DIAGNOSIS — N898 Other specified noninflammatory disorders of vagina: Secondary | ICD-10-CM | POA: Diagnosis not present

## 2022-05-29 DIAGNOSIS — Z202 Contact with and (suspected) exposure to infections with a predominantly sexual mode of transmission: Secondary | ICD-10-CM | POA: Diagnosis not present

## 2022-05-29 MED ORDER — CEFTRIAXONE SODIUM 500 MG IJ SOLR
500.0000 mg | Freq: Once | INTRAMUSCULAR | Status: AC
  Administered 2022-05-29: 500 mg via INTRAMUSCULAR

## 2022-05-29 NOTE — Patient Instructions (Signed)
If you would like a test of cure, return in 4 weeks after treatment.

## 2022-05-29 NOTE — Progress Notes (Signed)
GYNECOLOGY OFFICE VISIT NOTE  History:   Darlene Morgan is a 26 y.o. [phone] here today for a chlamydia TOC.   She was diagnosed with chlamydia on 05/09/2022 and completed treatment. She reports her current sexual partner was also treated prior to resuming activity (not sure what he was treated for however). However, she reports that he tested positive for gonorrhea (not chlamydia that she told him) so she wants to know if these are different. They do not use condoms. She is not on contraception currently.   She is having some vaginal odor, but otherwise no rash, dysuria, or irritation/itching.   She is 7 months postpartum and doing well with this. Her daughter is doing well.     Past Medical History:  Diagnosis Date   Asthma    Heart murmur    pt states small    Supervision of normal first pregnancy, antepartum 01/25/2016    Clinic [facility] Prenatal Labs Dating LMP = 17 wk u/s Blood type: O/POS/-- (02/24 1537)  Genetic Screen 1 Screen:    AFP:     Quad:     NIPS: Antibody:NEG (02/24 1537) Anatomic US WNL female Rubella: 2.05 (02/24 1537) GTT Third trimester: 77/63/69 RPR: Non Reactive (07/05 0840)  Flu vaccine  HBsAg: NEGATIVE (02/24 1537)  TDaP vaccine                                               HIV: Non Reactive (07/05 0840)  Baby Food               Breast                                GBS: (For PCN allergy, check sensitivities) Contraception  Depo injections Pap: Circumcision NA  Pediatrician  [facility] [place]  Support Person        Past Surgical History:  Procedure Laterality Date   TONSILLECTOMY      The following portions of the patient's history were reviewed and updated as appropriate: allergies, current medications, past family history, past medical history, past social history, past surgical history and problem list.   Health Maintenance:  Negative/normal on 05/07/2022  Review of Systems:  Pertinent items noted in HPI and remainder of comprehensive ROS otherwise  negative.  Physical Exam:  LMP 04/17/2022  CONSTITUTIONAL: Well-developed, well-nourished female in no acute distress.  HEENT:  Normocephalic, atraumatic. External right and left ear normal. No scleral icterus.  NECK: Normal range of motion, supple, no masses noted on observation SKIN: No rash noted. Not diaphoretic. No erythema. No pallor. MUSCULOSKELETAL: Normal range of motion. No edema noted. NEUROLOGIC: Alert and oriented to person, place, and time. PSYCHIATRIC: Normal mood and affect. Normal behavior. Normal judgment and thought content. CARDIOVASCULAR: Normal heart rate noted RESPIRATORY: Effort WNL PELVIC: Deferred   Assessment and Plan:   1. Vaginal discharge 2. STD exposure 3. Screening examination for STD (sexually transmitted disease) Partner tested + for gonorrhea a few weeks ago-- will preemptively treat her for this given known exposure. Awaiting chlamydia TOC today, but low threshold to treat again as unclear if her partner got treated for just gonorrhea or both. Encouraged condom use. Discussed repeat STD screening prior to/with every new partner.  - cefTRIAXone (ROCEPHIN) injection 500 mg - Cervicovaginal ancillary only( [facility])  4. Contraception counseling She is tracking her cycle and avoiding sexual activity around ovulation time. Discussed that is not 100% (~ 80% effective or less) but is helpful. Encouraged condoms as above. She is not interested in any birth control at this time.     Return if symptoms worsen or fail to improve, may need repeat TOC in 4 weeks after treatment pending results.    [name], DO  OB Fellow, Faculty [facility] 05/29/2022 10:52 AM

## 2022-05-29 NOTE — Progress Notes (Signed)
Patient presents for [facility] for chlamydia.  Reports vaginal odor, but denies itching, buring, irritation.

## 2022-05-30 ENCOUNTER — Other Ambulatory Visit: Payer: Self-pay | Admitting: Family Medicine

## 2022-05-30 DIAGNOSIS — B3731 Acute candidiasis of vulva and vagina: Secondary | ICD-10-CM

## 2022-05-30 LAB — CERVICOVAGINAL ANCILLARY ONLY
Bacterial Vaginitis (gardnerella): NEGATIVE
Candida Glabrata: NEGATIVE
Candida Vaginitis: POSITIVE — AB
Chlamydia: NEGATIVE
Comment: NEGATIVE
Comment: NEGATIVE
Comment: NEGATIVE
Comment: NEGATIVE
Comment: NEGATIVE
Comment: NORMAL
Neisseria Gonorrhea: NEGATIVE
Trichomonas: NEGATIVE

## 2022-05-30 MED ORDER — FLUCONAZOLE 150 MG PO TABS: 150.0000 mg | ORAL_TABLET | ORAL | 0 refills | Status: DC

## 2022-06-09 ENCOUNTER — Ambulatory Visit: Payer: Medicaid Other

## 2022-06-17 ENCOUNTER — Ambulatory Visit: Payer: Medicaid Other

## 2022-06-24 ENCOUNTER — Ambulatory Visit (INDEPENDENT_AMBULATORY_CARE_PROVIDER_SITE_OTHER): Payer: Medicaid Other | Admitting: *Deleted

## 2022-06-24 ENCOUNTER — Other Ambulatory Visit (HOSPITAL_COMMUNITY)
Admission: RE | Admit: 2022-06-24 | Discharge: 2022-06-24 | Disposition: A | Discharge status: Home / Self Care | Payer: Medicaid Other | Source: Ambulatory Visit | Attending: Obstetrics and Gynecology | Admitting: Obstetrics and Gynecology

## 2022-06-24 DIAGNOSIS — Z113 Encounter for screening for infections with a predominantly sexual mode of transmission: Secondary | ICD-10-CM | POA: Insufficient documentation

## 2022-06-24 DIAGNOSIS — Z202 Contact with and (suspected) exposure to infections with a predominantly sexual mode of transmission: Secondary | ICD-10-CM

## 2022-06-24 DIAGNOSIS — Z8619 Personal history of other infectious and parasitic diseases: Secondary | ICD-10-CM | POA: Insufficient documentation

## 2022-06-24 NOTE — Progress Notes (Signed)
SUBJECTIVE:  26 y.o. female here for follow up testing, hx of +CH.   OBJECTIVE:  She appears well, afebrile.   ASSESSMENT:  Self Swab Vaginal discharge Hx +CH  PLAN:  GC, chlamydia, trichomonas, BVAG, CVAG probe sent to lab. Treatment: To be determined once lab results are received ROV prn if symptoms persist or worsen.

## 2022-06-24 NOTE — Progress Notes (Signed)
Patient was assessed and managed by nursing staff during this encounter. I have reviewed the chart and agree with the documentation and plan. I have also made any necessary editorial changes.  Rosalyn Archambault A Bon Dowis, MD 06/24/2022 3:01 PM   

## 2022-06-25 LAB — CERVICOVAGINAL ANCILLARY ONLY
Bacterial Vaginitis (gardnerella): POSITIVE — AB
Candida Glabrata: NEGATIVE
Candida Vaginitis: NEGATIVE
Chlamydia: NEGATIVE
Comment: NEGATIVE
Comment: NEGATIVE
Comment: NEGATIVE
Comment: NEGATIVE
Comment: NEGATIVE
Comment: NORMAL
Neisseria Gonorrhea: NEGATIVE
Trichomonas: NEGATIVE

## 2022-06-26 ENCOUNTER — Other Ambulatory Visit: Payer: Self-pay | Admitting: Emergency Medicine

## 2022-06-26 DIAGNOSIS — B9689 Other specified bacterial agents as the cause of diseases classified elsewhere: Secondary | ICD-10-CM

## 2022-06-26 MED ORDER — METRONIDAZOLE 500 MG PO TABS: 500.0000 mg | ORAL_TABLET | Freq: Two times a day (BID) | ORAL | 0 refills | Status: DC

## 2022-06-26 NOTE — Progress Notes (Signed)
Flagyl sent per protocol

## 2022-11-15 ENCOUNTER — Other Ambulatory Visit: Payer: Self-pay | Admitting: Obstetrics and Gynecology

## 2022-11-15 DIAGNOSIS — B9689 Other specified bacterial agents as the cause of diseases classified elsewhere: Secondary | ICD-10-CM

## 2022-11-18 ENCOUNTER — Other Ambulatory Visit (HOSPITAL_COMMUNITY)
Admission: RE | Admit: 2022-11-18 | Discharge: 2022-11-18 | Disposition: A | Discharge status: Home / Self Care | Payer: Medicaid Other | Source: Ambulatory Visit | Attending: Obstetrics and Gynecology | Admitting: Obstetrics and Gynecology

## 2022-11-18 ENCOUNTER — Other Ambulatory Visit: Payer: Self-pay | Admitting: Obstetrics

## 2022-11-18 ENCOUNTER — Ambulatory Visit (INDEPENDENT_AMBULATORY_CARE_PROVIDER_SITE_OTHER): Payer: Medicaid Other | Admitting: *Deleted

## 2022-11-18 VITALS — BP 134/70 | HR 109

## 2022-11-18 DIAGNOSIS — N898 Other specified noninflammatory disorders of vagina: Secondary | ICD-10-CM | POA: Insufficient documentation

## 2022-11-18 DIAGNOSIS — N760 Acute vaginitis: Secondary | ICD-10-CM

## 2022-11-18 DIAGNOSIS — Z3169 Encounter for other general counseling and advice on procreation: Secondary | ICD-10-CM

## 2022-11-18 MED ORDER — VITAFOL ULTRA 29-0.6-0.4-200 MG PO CAPS: 1.0000 | ORAL_CAPSULE | Freq: Every day | ORAL | 4 refills | Status: DC

## 2022-11-18 MED ORDER — METRONIDAZOLE 500 MG PO TABS: ORAL_TABLET | ORAL | 2 refills | Status: DC

## 2022-11-18 NOTE — Progress Notes (Signed)
SUBJECTIVE:  26 y.o. female complains of "fishy odor" 2 days ago. Denies vaginal discharge, itching, or irritation. Denies abnormal vaginal bleeding or significant pelvic pain or fever. No UTI symptoms. Denies history of known exposure to STD.  No LMP recorded.  OBJECTIVE:  She appears well, afebrile. Urine dipstick: not done.  ASSESSMENT:  Vaginal Discharge  Vaginal Odor   PLAN:  GC, chlamydia, trichomonas, BVAG, CVAG probe sent to lab. Treatment: To be determined once lab results are received ROV prn if symptoms persist or worsen.

## 2022-11-19 LAB — CERVICOVAGINAL ANCILLARY ONLY
Bacterial Vaginitis (gardnerella): NEGATIVE
Candida Glabrata: NEGATIVE
Candida Vaginitis: POSITIVE — AB
Chlamydia: NEGATIVE
Comment: NEGATIVE
Comment: NEGATIVE
Comment: NEGATIVE
Comment: NEGATIVE
Comment: NEGATIVE
Comment: NORMAL
Neisseria Gonorrhea: NEGATIVE
Trichomonas: NEGATIVE

## 2022-11-20 ENCOUNTER — Other Ambulatory Visit: Payer: Self-pay

## 2022-11-20 DIAGNOSIS — B3731 Acute candidiasis of vulva and vagina: Secondary | ICD-10-CM

## 2022-11-20 MED ORDER — FLUCONAZOLE 150 MG PO TABS: 150.0000 mg | ORAL_TABLET | Freq: Once | ORAL | 0 refills | Status: AC

## 2022-11-20 NOTE — Progress Notes (Signed)
Diflucan for yeast infection sent per protocol

## 2022-11-26 NOTE — Telephone Encounter (Signed)
Pt needs appointment

## 2023-02-16 ENCOUNTER — Ambulatory Visit (INDEPENDENT_AMBULATORY_CARE_PROVIDER_SITE_OTHER): Payer: Medicaid Other

## 2023-02-16 ENCOUNTER — Other Ambulatory Visit (HOSPITAL_COMMUNITY)
Admission: RE | Admit: 2023-02-16 | Discharge: 2023-02-16 | Disposition: A | Discharge status: Home / Self Care | Payer: Medicaid Other | Source: Ambulatory Visit | Attending: Obstetrics and Gynecology | Admitting: Obstetrics and Gynecology

## 2023-02-16 VITALS — BP 121/75 | HR 80 | Wt 153.5 lb

## 2023-02-16 DIAGNOSIS — Z3202 Encounter for pregnancy test, result negative: Secondary | ICD-10-CM | POA: Diagnosis not present

## 2023-02-16 DIAGNOSIS — N898 Other specified noninflammatory disorders of vagina: Secondary | ICD-10-CM | POA: Insufficient documentation

## 2023-02-16 DIAGNOSIS — R112 Nausea with vomiting, unspecified: Secondary | ICD-10-CM | POA: Diagnosis not present

## 2023-02-16 DIAGNOSIS — Z113 Encounter for screening for infections with a predominantly sexual mode of transmission: Secondary | ICD-10-CM

## 2023-02-16 LAB — POCT URINE PREGNANCY: Preg Test, Ur: NEGATIVE

## 2023-02-16 NOTE — Progress Notes (Signed)
SUBJECTIVE:  27 y.o. female who desires a STI screen. Patient requests UPT due to N&V. Denies abnormal bleeding or significant pelvic pain. No UTI symptoms. Denies known exposure to STD.   OBJECTIVE:  She appears well. UPT - negative    ASSESSMENT:  STI Screen   PLAN:  Pt offered STI blood screening-Declined  GC, chlamydia, and trichomonas probe sent to lab.     Treatment: To be determined once lab results are received.  Pt follow up as needed.

## 2023-02-18 LAB — CERVICOVAGINAL ANCILLARY ONLY
Bacterial Vaginitis (gardnerella): POSITIVE — AB
Candida Glabrata: NEGATIVE
Candida Vaginitis: POSITIVE — AB
Chlamydia: NEGATIVE
Comment: NEGATIVE
Comment: NEGATIVE
Comment: NEGATIVE
Comment: NEGATIVE
Comment: NEGATIVE
Comment: NORMAL
Neisseria Gonorrhea: NEGATIVE
Trichomonas: NEGATIVE

## 2023-02-19 MED ORDER — FLUCONAZOLE 150 MG PO TABS: 150.0000 mg | ORAL_TABLET | Freq: Once | ORAL | 0 refills | Status: AC

## 2023-02-19 MED ORDER — METRONIDAZOLE 500 MG PO TABS: 500.0000 mg | ORAL_TABLET | Freq: Two times a day (BID) | ORAL | 0 refills | Status: DC

## 2023-02-19 NOTE — Addendum Note (Signed)
Addended by: Mora Bellman on: 02/19/2023 11:05 AM   Modules accepted: Orders

## 2023-04-02 ENCOUNTER — Ambulatory Visit: Payer: Medicaid Other | Admitting: Obstetrics and Gynecology

## 2023-04-20 ENCOUNTER — Ambulatory Visit (INDEPENDENT_AMBULATORY_CARE_PROVIDER_SITE_OTHER): Payer: Medicaid Other | Admitting: *Deleted

## 2023-04-20 ENCOUNTER — Other Ambulatory Visit (HOSPITAL_COMMUNITY)
Admission: RE | Admit: 2023-04-20 | Discharge: 2023-04-20 | Disposition: A | Discharge status: Home / Self Care | Payer: Medicaid Other | Source: Ambulatory Visit | Attending: Obstetrics and Gynecology | Admitting: Obstetrics and Gynecology

## 2023-04-20 VITALS — BP 123/83 | HR 74

## 2023-04-20 DIAGNOSIS — N898 Other specified noninflammatory disorders of vagina: Secondary | ICD-10-CM | POA: Insufficient documentation

## 2023-04-20 NOTE — Progress Notes (Signed)
SUBJECTIVE:  27 y.o. female complains of no vaginal discharge for 0 day(s). Reports vaginal odor.  Denies abnormal vaginal bleeding or significant pelvic pain or fever. No UTI symptoms. Denies history of known exposure to STD.  No LMP recorded.  OBJECTIVE:  She appears well, afebrile. Urine dipstick: not done.  ASSESSMENT:  Vaginal Odor   PLAN:  GC, chlamydia, trichomonas, BVAG, CVAG probe sent to lab. Treatment: To be determined once lab results are received ROV prn if symptoms persist or worsen.

## 2023-04-21 ENCOUNTER — Telehealth: Payer: Self-pay

## 2023-04-21 ENCOUNTER — Other Ambulatory Visit: Payer: Self-pay | Admitting: Obstetrics

## 2023-04-21 DIAGNOSIS — B9689 Other specified bacterial agents as the cause of diseases classified elsewhere: Secondary | ICD-10-CM

## 2023-04-21 LAB — CERVICOVAGINAL ANCILLARY ONLY
Bacterial Vaginitis (gardnerella): POSITIVE — AB
Candida Glabrata: NEGATIVE
Candida Vaginitis: NEGATIVE
Chlamydia: NEGATIVE
Comment: NEGATIVE
Comment: NEGATIVE
Comment: NEGATIVE
Comment: NEGATIVE
Comment: NEGATIVE
Comment: NORMAL
Neisseria Gonorrhea: NEGATIVE
Trichomonas: NEGATIVE

## 2023-04-21 NOTE — Telephone Encounter (Signed)
Calling about test results. Patient informed that results are not back yet

## 2023-04-22 MED ORDER — METRONIDAZOLE 500 MG PO TABS: 500.0000 mg | ORAL_TABLET | Freq: Two times a day (BID) | ORAL | 0 refills | Status: DC

## 2023-04-22 NOTE — Addendum Note (Signed)
Addended by: Catalina Antigua on: 04/22/2023 07:30 AM   Modules accepted: Orders

## 2023-04-26 ENCOUNTER — Other Ambulatory Visit: Payer: Self-pay | Admitting: Obstetrics and Gynecology

## 2023-04-28 MED ORDER — METRONIDAZOLE 500 MG PO TABS: 500.0000 mg | ORAL_TABLET | Freq: Two times a day (BID) | ORAL | 0 refills | Status: DC

## 2023-07-21 ENCOUNTER — Ambulatory Visit: Payer: Medicaid Other

## 2023-09-03 ENCOUNTER — Other Ambulatory Visit (HOSPITAL_COMMUNITY)
Admission: RE | Admit: 2023-09-03 | Discharge: 2023-09-03 | Disposition: A | Discharge status: Home / Self Care | Payer: Medicaid Other | Source: Ambulatory Visit | Attending: Obstetrics and Gynecology | Admitting: Obstetrics and Gynecology

## 2023-09-03 ENCOUNTER — Ambulatory Visit: Payer: Medicaid Other

## 2023-09-03 VITALS — BP 120/85 | HR 90 | Wt 151.1 lb

## 2023-09-03 DIAGNOSIS — Z202 Contact with and (suspected) exposure to infections with a predominantly sexual mode of transmission: Secondary | ICD-10-CM | POA: Diagnosis present

## 2023-09-03 NOTE — Progress Notes (Signed)
SUBJECTIVE:  27 y.o. female complains of possible exposure to trichomonas. Denies abnormal vaginal bleeding, discharge, odor or significant pelvic pain or fever. No UTI symptoms.   Patient's last menstrual period was 08/19/2023.  OBJECTIVE:  She appears well, afebrile. Urine dipstick: not done.  ASSESSMENT:  Vaginal Discharge  Vaginal Odor   PLAN:  GC, chlamydia, trichomonas, BVAG, CVAG probe sent to lab. Treatment: To be determined once lab results are received ROV prn if symptoms persist or worsen.

## 2023-09-07 LAB — CERVICOVAGINAL ANCILLARY ONLY
Bacterial Vaginitis (gardnerella): NEGATIVE
Candida Glabrata: NEGATIVE
Candida Vaginitis: NEGATIVE
Chlamydia: NEGATIVE
Comment: NEGATIVE
Comment: NEGATIVE
Comment: NEGATIVE
Comment: NEGATIVE
Comment: NEGATIVE
Comment: NORMAL
Neisseria Gonorrhea: NEGATIVE
Trichomonas: NEGATIVE

## 2023-09-12 ENCOUNTER — Other Ambulatory Visit: Payer: Self-pay | Admitting: Obstetrics and Gynecology

## 2023-09-14 MED ORDER — METRONIDAZOLE 500 MG PO TABS: 500.0000 mg | ORAL_TABLET | Freq: Two times a day (BID) | ORAL | 0 refills | Status: DC

## 2023-12-04 ENCOUNTER — Ambulatory Visit: Payer: Medicaid Other

## 2023-12-08 ENCOUNTER — Ambulatory Visit: Payer: Medicaid Other | Admitting: Family Medicine

## 2023-12-08 ENCOUNTER — Other Ambulatory Visit (HOSPITAL_COMMUNITY)
Admission: RE | Admit: 2023-12-08 | Discharge: 2023-12-08 | Disposition: A | Discharge status: Home / Self Care | Payer: Medicaid Other | Source: Ambulatory Visit | Attending: Family Medicine | Admitting: Family Medicine

## 2023-12-08 VITALS — BP 120/73 | HR 96 | Ht 65.0 in | Wt 147.0 lb

## 2023-12-08 DIAGNOSIS — Z113 Encounter for screening for infections with a predominantly sexual mode of transmission: Secondary | ICD-10-CM | POA: Insufficient documentation

## 2023-12-08 DIAGNOSIS — N898 Other specified noninflammatory disorders of vagina: Secondary | ICD-10-CM | POA: Diagnosis present

## 2023-12-08 DIAGNOSIS — Z3202 Encounter for pregnancy test, result negative: Secondary | ICD-10-CM | POA: Diagnosis not present

## 2023-12-08 LAB — POCT URINE PREGNANCY: Preg Test, Ur: NEGATIVE

## 2023-12-08 MED ORDER — FLUCONAZOLE 150 MG PO TABS: 150.0000 mg | ORAL_TABLET | Freq: Once | ORAL | 0 refills | Status: AC

## 2023-12-08 NOTE — Progress Notes (Addendum)
  SUBJECTIVE:   28 y.o. female complains of white vaginal discharge, odor for 7 day(s).  Pt requested STD Bloodwork. Denies abnormal vaginal bleeding or significant pelvic pain or fever. No UTI symptoms. Denies history of known exposure to STD.  Patient's last menstrual period was 11/10/2023 (exact date).  OBJECTIVE:  She appears well, afebrile. Urine dipstick: not done.  ASSESSMENT:  Unprotected sex and possible exposure Vaginal Discharge  Vaginal Odor   PLAN:  GC, chlamydia, trichomonas, BVAG, CVAG probe and bloodwork sent to lab. Treatment: To be determined once lab results are received ROV prn if symptoms persist or worsen.

## 2023-12-09 LAB — CERVICOVAGINAL ANCILLARY ONLY
Bacterial Vaginitis (gardnerella): NEGATIVE
Candida Glabrata: NEGATIVE
Candida Vaginitis: POSITIVE — AB
Chlamydia: NEGATIVE
Comment: NEGATIVE
Comment: NEGATIVE
Comment: NEGATIVE
Comment: NEGATIVE
Comment: NEGATIVE
Comment: NORMAL
Neisseria Gonorrhea: NEGATIVE
Trichomonas: NEGATIVE

## 2023-12-09 LAB — HEPATITIS B SURFACE ANTIGEN: Hepatitis B Surface Ag: NEGATIVE

## 2023-12-09 LAB — RPR: RPR Ser Ql: NONREACTIVE

## 2023-12-09 LAB — HEPATITIS C ANTIBODY: Hep C Virus Ab: NONREACTIVE

## 2023-12-09 LAB — HIV ANTIBODY (ROUTINE TESTING W REFLEX): HIV Screen 4th Generation wRfx: NONREACTIVE

## 2023-12-15 MED ORDER — FLUCONAZOLE 150 MG PO TABS: 150.0000 mg | ORAL_TABLET | Freq: Once | ORAL | 0 refills | Status: AC

## 2023-12-15 NOTE — Addendum Note (Signed)
 Addended by: Celedonio Savage on: 12/15/2023 04:47 PM   Modules accepted: Orders

## 2024-01-17 ENCOUNTER — Inpatient Hospital Stay (HOSPITAL_COMMUNITY)
Admission: AD | Admit: 2024-01-17 | Discharge: 2024-01-17 | Disposition: A | Discharge status: Home / Self Care | Payer: Medicaid Other | Attending: Obstetrics & Gynecology | Admitting: Obstetrics & Gynecology

## 2024-01-17 ENCOUNTER — Encounter (HOSPITAL_COMMUNITY): Payer: Self-pay

## 2024-01-17 DIAGNOSIS — Z3201 Encounter for pregnancy test, result positive: Secondary | ICD-10-CM | POA: Diagnosis present

## 2024-01-17 LAB — POCT PREGNANCY, URINE: Preg Test, Ur: POSITIVE — AB

## 2024-01-17 NOTE — Discharge Instructions (Signed)
 Safe Medications in Pregnancy   Acne: Benzoyl Peroxide Salicylic Acid  Backache/Headache: Tylenol: 2 regular strength every 4 hours OR              2 Extra strength every 6 hours  Colds/Coughs/Allergies: Benadryl (alcohol free) 25 mg every 6 hours as needed Breath right strips Claritin Cepacol throat lozenges Chloraseptic throat spray Cold-Eeze- up to three times per day Cough drops, alcohol free Flonase (by prescription only) Guaifenesin Mucinex Robitussin DM (plain only, alcohol free) Saline nasal spray/drops Sudafed (pseudoephedrine) & Actifed ** use only after [redacted] weeks gestation and if you do not have high blood pressure Tylenol Vicks Vaporub Zinc lozenges Zyrtec   Constipation: Colace Ducolax suppositories Fleet enema Glycerin suppositories Metamucil Milk of magnesia Miralax Senokot Smooth move tea  Diarrhea: Kaopectate Imodium A-D  *NO pepto Bismol  Hemorrhoids: Anusol Anusol HC Preparation H Tucks  Indigestion: Tums Maalox Mylanta Cimetidine (Tagamet HB)** preferred in pregnancy Famotidine (Pepcid) Ranitidine (Zantac)  Insomnia: Benadryl (alcohol free) 25mg  every 6 hours as needed Tylenol PM Unisom, no Gelcaps  Leg Cramps: Tums MagGel  Nausea/Vomiting:  Bonine Dramamine Emetrol Ginger extract Sea bands Meclizine   Nausea medication to take during pregnancy:  Unisom (doxylamine succinate 25 mg tablets) Take one tablet daily at bedtime. If symptoms are not adequately controlled, the dose can be increased to a maximum recommended dose of two tablets daily (1/2 tablet in the morning, 1/2 tablet mid-afternoon and one at bedtime). Vitamin B6 100mg  tablets. Take one tablet twice a day (up to 200 mg per day).  Skin Rashes: Aveeno products Benadryl cream or 25mg  every 6 hours as needed Calamine Lotion 1% cortisone cream  Yeast infection: Gyne-lotrimin 7 Monistat 7   **If taking multiple medications, please check labels to avoid  duplicating the same active ingredients **take medication as directed on the label ** Do not exceed 4000 mg of tylenol in 24 hours **Do not take medications that contain aspirin or ibuprofen

## 2024-01-17 NOTE — MAU Provider Note (Signed)
 Chief Complaint: Possible Pregnancy   None     SUBJECTIVE HPI: Darlene Morgan is a 28 y.o. [id number] who presents to maternity admissions to check on baby. Recently found out she was pregnant, wanted to see how far she was per U/S today. She denies any specific complaints including abdominal pain, abnormal vaginal discharge, or vaginal bleeding.    Past Medical History:  Diagnosis Date   Asthma    Heart murmur    pt states small    Supervision of normal first pregnancy, antepartum 01/25/2016    Clinic [facility] Prenatal Labs Dating LMP = 17 wk u/s Blood type: O/POS/-- (02/24 1537)  Genetic Screen 1 Screen:    AFP:     Quad:     NIPS: Antibody:NEG (02/24 1537) Anatomic US WNL female Rubella: 2.05 (02/24 1537) GTT Third trimester: 77/63/69 RPR: Non Reactive (07/05 0840)  Flu vaccine  HBsAg: NEGATIVE (02/24 1537)  TDaP vaccine                                               HIV: Non Reactive (07/05 0840)  Baby Food               Breast                                GBS: (For PCN allergy, check sensitivities) Contraception  Depo injections Pap: Circumcision NA  Pediatrician  [facility] [place]  Support Person       Past Surgical History:  Procedure Laterality Date   TONSILLECTOMY     Social History   Socioeconomic History   Marital status: Single    Spouse name: Not on file   Number of children: 1   Years of education: Not on file   Highest education level: Not on file  Occupational History   Not on file  Tobacco Use   Smoking status: Former    Types: Cigarettes    Passive exposure: Never   Smokeless tobacco: Current  Vaping Use   Vaping status: Never Used  Substance and Sexual Activity   Alcohol use: Yes    Alcohol/week: 0.0 standard drinks of alcohol   Drug use: Not Currently    Frequency: 1.0 times per week    Types: Marijuana    Comment: last used 4/20   Sexual activity: Yes    Partners: Male    Birth control/protection: None  Other Topics Concern   Not on file  Social  History Narrative   Not on file   Social Drivers of Health   Financial Resource Strain: Not on file  Food Insecurity: Not on file  Transportation Needs: Not on file  Physical Activity: Not on file  Stress: Not on file  Social Connections: Not on file  Intimate Partner Violence: Not on file   No current facility-administered medications on file prior to encounter.   Current Outpatient Medications on File Prior to Encounter  Medication Sig Dispense Refill   Prenat-Fe Poly-Methfol-FA-DHA (VITAFOL ULTRA) 29-0.6-0.4-200 MG CAPS Take 1 capsule by mouth daily before breakfast. (Patient not taking: Reported on 12/08/2023) 90 capsule 4   Allergies  Allergen Reactions   Hydrocodone Itching    I have reviewed patient's Past Medical Hx, Surgical Hx, Family Hx, Social Hx, medications and allergies.   Physical Exam  Patient Vitals for the past 24 hrs:  BP Temp Temp src Pulse Resp SpO2  01/17/24 1354 -- 97.8 F (36.6 C) Oral -- 17 100 %  01/17/24 1352 118/63 -- -- (!) 102 -- --   Physical Exam Constitutional:      General: She is not in acute distress.    Appearance: Normal appearance. She is not ill-appearing.  HENT:     Head: Normocephalic and atraumatic.  Cardiovascular:     Rate and Rhythm: Normal rate.  Pulmonary:     Effort: Pulmonary effort is normal.     Breath sounds: Normal breath sounds.  Abdominal:     Palpations: Abdomen is soft.     Tenderness: There is no abdominal tenderness. There is no guarding.  Musculoskeletal:        General: Normal range of motion.  Skin:    General: Skin is warm and dry.     Findings: No rash.  Neurological:     General: No focal deficit present.     Mental Status: She is alert and oriented to person, place, and time.     MDM Patient denies any concerning symptoms in need of emergent evaluation. Patient recommended to establish care with primary OB -- I sent a message to [facility] to help facilitate new OB visit. Discussed reasons for return  to MAU sooner than appt. Stable for d/c.  ASSESSMENT MSE Complete  PLAN Discharge patient at her request to seek non-emergent medical care elsewhere  Sundra Aland, MD 01/17/2024 3:17 PM

## 2024-01-17 NOTE — MAU Note (Signed)
.  Darlene Morgan is a 28 y.o. at [redacted]w[redacted]d here in MAU reporting: she had a pos hpt last week.  Pt states she'd like to know how far along she is and that there is only "one baby in there".. Denies pain or bleeding.   LMP: 1/8  Pain score: 0 Vitals:   01/17/24 1352  BP: 118/63  Pulse: (!) 102      Lab orders placed from triage: upt

## 2024-02-23 ENCOUNTER — Telehealth: Payer: Medicaid Other | Admitting: *Deleted

## 2024-02-23 DIAGNOSIS — Z3200 Encounter for pregnancy test, result unknown: Secondary | ICD-10-CM

## 2024-02-23 NOTE — Progress Notes (Signed)
 New OB Intake  I connected with Darlene Morgan  on 02/23/24 at  1:15 PM EDT by MyChart Video Visit and verified that I am speaking with the correct person using two identifiers. Nurse is located at [facility] and pt is located at home.  As I began visit patient confirmed her name and date of birth but informed nurse she is not pregnant anymore and did not answer when I asked what happened to her pregnancy. I apologized for visit and confirmed I would cancel her new ob appointment. I asked if there was anything I could help with and she declined.  Nancy Fetter

## 2024-03-02 ENCOUNTER — Encounter: Payer: Medicaid Other | Admitting: Family Medicine

## 2024-04-18 ENCOUNTER — Encounter (HOSPITAL_COMMUNITY): Payer: Self-pay | Admitting: *Deleted

## 2024-04-18 ENCOUNTER — Ambulatory Visit (HOSPITAL_COMMUNITY): Admission: EM | Admit: 2024-04-18 | Discharge: 2024-04-18 | Disposition: A | Discharge status: Home / Self Care

## 2024-04-18 ENCOUNTER — Other Ambulatory Visit: Payer: Self-pay

## 2024-04-18 DIAGNOSIS — N912 Amenorrhea, unspecified: Secondary | ICD-10-CM | POA: Diagnosis not present

## 2024-04-18 DIAGNOSIS — Z3202 Encounter for pregnancy test, result negative: Secondary | ICD-10-CM

## 2024-04-18 DIAGNOSIS — J029 Acute pharyngitis, unspecified: Secondary | ICD-10-CM

## 2024-04-18 HISTORY — DX: Attention-deficit hyperactivity disorder, unspecified type: F90.9

## 2024-04-18 LAB — POCT URINE PREGNANCY: Preg Test, Ur: NEGATIVE

## 2024-04-18 LAB — POC COVID19/FLU A&B COMBO
Covid Antigen, POC: NEGATIVE
Influenza A Antigen, POC: NEGATIVE
Influenza B Antigen, POC: NEGATIVE

## 2024-04-18 MED ORDER — LIDOCAINE VISCOUS HCL 2 % MT SOLN: 15.0000 mL | OROMUCOSAL | 0 refills | Status: DC | PRN

## 2024-04-18 NOTE — ED Provider Notes (Addendum)
 [facility]    CSN: [id number] Arrival date & time: 04/18/24  0808      History   Chief Complaint Chief Complaint  Patient presents with   Sore Throat   Cough   Amenorrhea    HPI Darlene Morgan is a 28 y.o. female.   Patient presents with sore throat x 3 days.  Patient states that this morning she began to have some congestion, runny nose, and cough.  Denies fever, body aches, chills, shortness of breath, chest pain, vomiting, diarrhea, and abdominal pain.  Patient reports that she took Tylenol  PM last night with some relief.  Denies any known sick contacts.  Patient is also requesting a pregnancy test.  Patient states she is unsure when her last menstrual cycle was.  Patient was pregnant earlier this year but reports having an abortion in February.  Patient denies any abnormal vaginal discharge, bleeding, abdominal pain, or urinary symptoms.    The history is provided by the patient and medical records.  Sore Throat  Cough   Past Medical History:  Diagnosis Date   ADHD    Asthma    Heart murmur    pt states small    Supervision of normal first pregnancy, antepartum 01/25/2016    Clinic [facility] Prenatal Labs Dating LMP = 17 wk u/s Blood type: O/POS/-- (02/24 1537)  Genetic Screen 1 Screen:    AFP:     Quad:     NIPS: Antibody:NEG (02/24 1537) Anatomic US  WNL female Rubella: 2.05 (02/24 1537) GTT Third trimester: 77/63/69 RPR: Non Reactive (07/05 0840)  Flu vaccine  HBsAg: NEGATIVE (02/24 1537)  TDaP vaccine                                               HIV: Non Reactive (07/05 0840)  Baby Food               Breast                                GBS: (For PCN allergy, check sensitivities) Contraception  Depo injections Pap: Circumcision NA  Pediatrician  [facility] [place]  Support Person        Patient Active Problem List   Diagnosis Date Noted   Vaginal delivery 10/17/2021   Poor weight gain of pregnancy, third trimester 09/12/2021   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 coli UTI 02/27/2021   Hyperthyroidism 02/08/2018    Past Surgical History:  Procedure Laterality Date   INDUCED ABORTION     Feb 2025   TONSILLECTOMY      OB History     Gravida  5   Para  2   Term  2   Preterm  0   AB  2   Living  2      SAB  0   IAB  2   Ectopic  0   Multiple  0   Live Births  2            Home Medications    Prior to Admission medications   Medication Sig Start Date End Date Taking? Authorizing Provider  lidocaine  (XYLOCAINE ) 2 % solution Use as directed 15 mLs in the  mouth or throat as needed for mouth pain. 04/18/24  Yes Levora Reas A, NP  Lisdexamfetamine Dimesylate (VYVANSE PO) Take by mouth.   Yes [provider]  Prenat-Fe Poly-Methfol-FA-DHA (VITAFOL  ULTRA) 29-0.6-0.4-200 MG CAPS Take 1 capsule by mouth daily before breakfast. Patient not taking: Reported on 02/16/2023 11/18/22   Gabrielle Joiner, MD    Family History Family History  Problem Relation Age of Onset   Hypertension Other    Hypertension Maternal Grandmother    Thyroid  disease Maternal Grandmother    Thyroid  disease Mother    Heart attack Father     Social History Social History   Tobacco Use   Smoking status: Former    Types: Cigarettes    Passive exposure: Never   Smokeless tobacco: Current  Vaping Use   Vaping status: Every Day  Substance Use Topics   Alcohol use: Yes    Comment: occasionally   Drug use: Not Currently    Types: Marijuana     Allergies   Hydrocodone   Review of Systems Review of Systems  Respiratory:  Positive for cough.    Per HPI  Physical Exam Triage Vital Signs ED Triage Vitals  Encounter Vitals Group     BP 04/18/24 0823 121/81     Systolic BP Percentile --      Diastolic BP Percentile --      Pulse Rate 04/18/24 0823 84     Resp 04/18/24 0823 16     Temp 04/18/24 0823 98.7 F (37.1 C)      Temp Source 04/18/24 0823 Oral     SpO2 04/18/24 0823 98 %     Weight --      Height --      Head Circumference --      Peak Flow --      Pain Score 04/18/24 0824 8     Pain Loc --      Pain Education --      Exclude from Growth Chart --    No data found.  Updated Vital Signs BP 121/81   Pulse 84   Temp 98.7 F (37.1 C) (Oral)   Resp 16   LMP  (LMP Unknown)   SpO2 98%   Breastfeeding No   Visual Acuity Right Eye Distance:   Left Eye Distance:   Bilateral Distance:    Right Eye Near:   Left Eye Near:    Bilateral Near:     Physical Exam Vitals and nursing note reviewed.  Constitutional:      General: She is awake. She is not in acute distress.    Appearance: Normal appearance. She is well-developed and well-groomed. She is not ill-appearing.  HENT:     Right Ear: Tympanic membrane, ear canal and external ear normal.     Left Ear: Tympanic membrane, ear canal and external ear normal.     Nose: Congestion and rhinorrhea present.     Mouth/Throat:     Mouth: Mucous membranes are moist.     Pharynx: Posterior oropharyngeal erythema present. No oropharyngeal exudate.  Cardiovascular:     Rate and Rhythm: Normal rate and regular rhythm.  Pulmonary:     Effort: Pulmonary effort is normal.     Breath sounds: Normal breath sounds.  Skin:    General: Skin is warm and dry.  Neurological:     Mental Status: She is alert.  Psychiatric:        Behavior: Behavior is cooperative.  UC Treatments / Results  Labs (all labs ordered are listed, but only abnormal results are displayed) Labs Reviewed  POC COVID19/FLU A&B COMBO  POCT URINE PREGNANCY    EKG   Radiology No results found.  Procedures Procedures (including critical care time)  Medications Ordered in UC Medications - No data to display  Initial Impression / Assessment and Plan / UC Course  I have reviewed the triage vital signs and the nursing notes.  Pertinent labs & imaging results that  were available during my care of the patient were reviewed by me and considered in my medical decision making (see chart for details).     Patient is well-appearing.  Vitals are stable.  Upon assessment there is mild erythema noted to pharynx and congestion and rhinorrhea are present.  Lungs clear bilaterally to auscultation.  COVID and flu testing negative.  Deferred strep testing due to presentation not consistent with strep throat.  Urine pregnancy negative.  Symptoms likely viral in nature.  Prescribed lidocaine  as needed for sore throat.  Discussed over-the-counter medication as needed for symptoms.  Discussed follow-up with OB/GYN regarding irregular menstrual cycles.  Discussed return precautions. Final Clinical Impressions(s) / UC Diagnoses   Final diagnoses:  Viral pharyngitis  Amenorrhea  Negative pregnancy test     Discharge Instructions      As discussed I believe your symptoms are likely related to a viral illness. You can use lidocaine  as needed for sore throat.  Gargle and spit this. Otherwise alternate between 650 mg of Tylenol  and 400 mg of ibuprofen  every 6-8 hours as needed for pain. Your pregnancy test was negative today.  Follow-up with your OB/GYN regarding concerns for irregular menstrual cycles. Return here as needed.  ED Prescriptions     Medication Sig Dispense Auth. Provider   lidocaine  (XYLOCAINE ) 2 % solution Use as directed 15 mLs in the mouth or throat as needed for mouth pain. 100 mL Levora Reas A, NP      PDMP not reviewed this encounter.   Karon Packer, NP 04/18/24 0913    Karon Packer, NP 04/18/24 [phone]

## 2024-04-18 NOTE — ED Triage Notes (Signed)
 C/O sore throat onset 3 days ago. Started with cough this AM. Denies fevers.  Also requesting pregnancy test. Unsure if late on her menstrual period. Reports having had abortion in Feb, but has been irregular since then and did not keep track of her pregnancy.

## 2024-04-18 NOTE — Discharge Instructions (Signed)
 As discussed I believe your symptoms are likely related to a viral illness. You can use lidocaine  as needed for sore throat.  Gargle and spit this. Otherwise alternate between 650 mg of Tylenol  and 400 mg of ibuprofen  every 6-8 hours as needed for pain. Your pregnancy test was negative today.  Follow-up with your OB/GYN regarding concerns for irregular menstrual cycles. Return here as needed.

## 2024-06-02 ENCOUNTER — Encounter: Payer: Self-pay | Admitting: Obstetrics

## 2024-06-02 ENCOUNTER — Other Ambulatory Visit: Payer: Self-pay

## 2024-06-02 DIAGNOSIS — B9689 Other specified bacterial agents as the cause of diseases classified elsewhere: Secondary | ICD-10-CM

## 2024-06-02 MED ORDER — METRONIDAZOLE 500 MG PO TABS: 500.0000 mg | ORAL_TABLET | Freq: Two times a day (BID) | ORAL | 0 refills | Status: DC

## 2024-09-08 ENCOUNTER — Inpatient Hospital Stay (HOSPITAL_COMMUNITY)
Admission: AD | Admit: 2024-09-08 | Discharge: 2024-09-08 | Discharge status: Against Medical Advice | Attending: Obstetrics and Gynecology | Admitting: Obstetrics and Gynecology

## 2024-09-08 NOTE — Progress Notes (Signed)
 Pt called a second time to be triaged, again no answer and not sitting in lobby.

## 2024-09-08 NOTE — Progress Notes (Signed)
 Called to be triaged, not sitting in lobby.

## 2024-09-08 NOTE — Progress Notes (Signed)
 Pt called third and final time to be triaged.  No answer and not in lobby.  Will be discharged off census board

## 2024-09-09 ENCOUNTER — Inpatient Hospital Stay (HOSPITAL_COMMUNITY)
Admission: AD | Admit: 2024-09-09 | Discharge: 2024-09-09 | Disposition: A | Discharge status: Home / Self Care | Attending: Obstetrics and Gynecology | Admitting: Obstetrics and Gynecology

## 2024-09-09 ENCOUNTER — Inpatient Hospital Stay (HOSPITAL_COMMUNITY)

## 2024-09-09 ENCOUNTER — Encounter (HOSPITAL_COMMUNITY): Payer: Self-pay | Admitting: *Deleted

## 2024-09-09 ENCOUNTER — Inpatient Hospital Stay (HOSPITAL_COMMUNITY)
Admission: AD | Admit: 2024-09-09 | Discharge: 2024-09-10 | Disposition: A | Discharge status: Home / Self Care | Source: Home / Self Care | Attending: Obstetrics & Gynecology | Admitting: Obstetrics & Gynecology

## 2024-09-09 DIAGNOSIS — Z3A00 Weeks of gestation of pregnancy not specified: Secondary | ICD-10-CM | POA: Diagnosis not present

## 2024-09-09 DIAGNOSIS — R7989 Other specified abnormal findings of blood chemistry: Secondary | ICD-10-CM

## 2024-09-09 DIAGNOSIS — O3680X0 Pregnancy with inconclusive fetal viability, not applicable or unspecified: Secondary | ICD-10-CM

## 2024-09-09 DIAGNOSIS — N939 Abnormal uterine and vaginal bleeding, unspecified: Secondary | ICD-10-CM

## 2024-09-09 DIAGNOSIS — O26851 Spotting complicating pregnancy, first trimester: Secondary | ICD-10-CM | POA: Insufficient documentation

## 2024-09-09 DIAGNOSIS — O26891 Other specified pregnancy related conditions, first trimester: Secondary | ICD-10-CM | POA: Insufficient documentation

## 2024-09-09 DIAGNOSIS — Z3A01 Less than 8 weeks gestation of pregnancy: Secondary | ICD-10-CM | POA: Insufficient documentation

## 2024-09-09 LAB — CBC
HCT: 37.1 % (ref 36.0–46.0)
Hemoglobin: 12.7 g/dL (ref 12.0–15.0)
MCH: 30.2 pg (ref 26.0–34.0)
MCHC: 34.2 g/dL (ref 30.0–36.0)
MCV: 88.3 fL (ref 80.0–100.0)
Platelets: 360 K/uL (ref 150–400)
RBC: 4.2 MIL/uL (ref 3.87–5.11)
RDW: 13.2 % (ref 11.5–15.5)
WBC: 6.3 K/uL (ref 4.0–10.5)
nRBC: 0 % (ref 0.0–0.2)

## 2024-09-09 LAB — WET PREP, GENITAL
Clue Cells Wet Prep HPF POC: NONE SEEN
Sperm: NONE SEEN
Trich, Wet Prep: NONE SEEN
WBC, Wet Prep HPF POC: <10 (ref <10)
Yeast Wet Prep HPF POC: NONE SEEN

## 2024-09-09 LAB — POCT PREGNANCY, URINE: Preg Test, Ur: POSITIVE — AB

## 2024-09-09 LAB — URINALYSIS, ROUTINE W REFLEX MICROSCOPIC
Bacteria, UA: NONE SEEN
Bilirubin Urine: NEGATIVE
Glucose, UA: NEGATIVE mg/dL
Ketones, ur: NEGATIVE mg/dL
Leukocytes,Ua: NEGATIVE
Nitrite: NEGATIVE
Protein, ur: NEGATIVE mg/dL
Specific Gravity, Urine: 1.005 (ref 1.005–1.030)
pH: 7 (ref 5.0–8.0)

## 2024-09-09 LAB — HCG, QUANTITATIVE, PREGNANCY: hCG, Beta Chain, Quant, S: 60 m[IU]/mL — ABNORMAL HIGH (ref <5)

## 2024-09-09 MED ORDER — ACETAMINOPHEN 500 MG PO TABS
1000.0000 mg | ORAL_TABLET | Freq: Once | ORAL | Status: AC
  Administered 2024-09-09: 1000 mg via ORAL
  Filled 2024-09-09: qty 2

## 2024-09-09 NOTE — MAU Provider Note (Signed)
 [name] is a 28 y.o. [phone] patient who presents to MAU today with complaint of a positive HPT and yesterday she started having lower abdominal cramping with some vaginal bleeding that started today.  She denies any urinary symptoms and offers no c/o vaginal burning, itching or irration.    O BP 110/70   Pulse 89   Temp 98.3 F (36.8 C)   Resp 18   Ht 5' 5 (1.651 m)   Wt 68 kg   LMP 08/08/2024   BMI 24.96 kg/m  Physical Exam Vitals and nursing note reviewed.  Constitutional:      General: She is not in acute distress.    Appearance: Normal appearance. She is not ill-appearing.  HENT:     Head: Normocephalic.     Nose: Nose normal.  Cardiovascular:     Rate and Rhythm: Normal rate.  Pulmonary:     Effort: Pulmonary effort is normal.  Abdominal:     Palpations: Abdomen is soft.  Musculoskeletal:        General: Normal range of motion.     Cervical back: Normal range of motion.  Skin:    General: Skin is warm.  Neurological:     Mental Status: She is alert and oriented to person, place, and time.  Psychiatric:        Mood and Affect: Mood normal.        Behavior: Behavior normal.    MDM  HIGH  Vaginal bleeding/ abdominal pain in early pregnacy CBC: NM HCG Quant: 60 ABO: O Positive OB Ultrasound: ( No evidence of an IUP at this time and no evidence of adnexal mass ) Pregnancy of unknown location vs early gestation - Serial HCG's recommended  Vaginal Swabs: Wet prep negative, GC pending at discharge UA: no evidence of UTI    Differential diagnosis considered for 1st trimester vaginal bleeding includes but is not limited to: ectopic pregnancy, complete spontaneous abortion, incomplete abortion, missed abortion, threatened abortion, embryonic/fetal demise, cervical insufficiency, cervical or vaginal disorder    Orders Placed This Encounter  Procedures   Wet prep, genital    Standing Status:   Standing    Number of Occurrences:   1   US  OB LESS  THAN 14 WEEKS WITH OB TRANSVAGINAL    Standing Status:   Standing    Number of Occurrences:   1    Symptom/Reason for Exam:   Vaginal bleeding [[id number]]   Urinalysis, Routine w reflex microscopic -Urine, Clean Catch    Standing Status:   Standing    Number of Occurrences:   1    Specimen Source:   Urine, Clean Catch [76]   hCG, quantitative, pregnancy    Standing Status:   Standing    Number of Occurrences:   1   CBC    Standing Status:   Standing    Number of Occurrences:   1   Pregnancy, urine POC    Standing Status:   Standing    Number of Occurrences:   1      Results for orders placed or performed during the hospital encounter of 09/09/24 (from the past 24 hours)  Urinalysis, Routine w reflex microscopic -Urine, Clean Catch     Status: Abnormal   Collection Time: 09/09/24 12:45 PM  Result Value Ref Range   Color, Urine COLORLESS (A) YELLOW   APPearance CLEAR CLEAR   Specific Gravity, Urine 1.005 1.005 - 1.030   pH 7.0 5.0 - 8.0  Glucose, UA NEGATIVE NEGATIVE mg/dL   Hgb urine dipstick SMALL (A) NEGATIVE   Bilirubin Urine NEGATIVE NEGATIVE   Ketones, ur NEGATIVE NEGATIVE mg/dL   Protein, ur NEGATIVE NEGATIVE mg/dL   Nitrite NEGATIVE NEGATIVE   Leukocytes,Ua NEGATIVE NEGATIVE   RBC / HPF 0-5 0 - 5 RBC/hpf   WBC, UA 0-5 0 - 5 WBC/hpf   Bacteria, UA NONE SEEN NONE SEEN   Squamous Epithelial / HPF 0-5 0 - 5 /HPF   Mucus PRESENT   Pregnancy, urine POC     Status: Abnormal   Collection Time: 09/09/24 12:53 PM  Result Value Ref Range   Preg Test, Ur POSITIVE (A) NEGATIVE  Wet prep, genital     Status: None   Collection Time: 09/09/24 12:57 PM  Result Value Ref Range   Yeast Wet Prep HPF POC NONE SEEN NONE SEEN   Trich, Wet Prep NONE SEEN NONE SEEN   Clue Cells Wet Prep HPF POC NONE SEEN NONE SEEN   WBC, Wet Prep HPF POC <10 <10   Sperm NONE SEEN   hCG, quantitative, pregnancy     Status: Abnormal   Collection Time: 09/09/24  1:13 PM  Result Value Ref Range   hCG,  Beta Chain, Quant, S 60 (H) <5 mIU/mL  CBC     Status: None   Collection Time: 09/09/24  1:13 PM  Result Value Ref Range   WBC 6.3 4.0 - 10.5 K/uL   RBC 4.20 3.87 - 5.11 MIL/uL   Hemoglobin 12.7 12.0 - 15.0 g/dL   HCT 62.8 63.9 - 53.9 %   MCV 88.3 80.0 - 100.0 fL   MCH 30.2 26.0 - 34.0 pg   MCHC 34.2 30.0 - 36.0 g/dL   RDW 86.7 88.4 - 84.4 %   Platelets 360 150 - 400 K/uL   nRBC 0.0 0.0 - 0.2 %     Study Result  Narrative & Impression  CLINICAL DATA:  Vaginal bleeding.   EXAM: OBSTETRIC <14 WK US  AND TRANSVAGINAL OB US    TECHNIQUE: Both transabdominal and transvaginal ultrasound examinations were performed for complete evaluation of the gestation as well as the maternal uterus, adnexal regions, and pelvic cul-de-sac. Transvaginal technique was performed to assess early pregnancy.   COMPARISON:  None Available.   FINDINGS: Intrauterine gestational sac: None   Yolk sac:  Not Visualized.   Embryo:  Not Visualized.   Cardiac Activity: Not Visualized.   Heart Rate: N/A  bpm   Maternal uterus/adnexae: The right ovary measures 2.9 cm x 2.8 cm x 2.0 cm and is normal in appearance.   The left ovary measures 2.8 cm x 1.8 cm x 1.6 cm and is normal in appearance.   There is a trace amount of pelvic free fluid.   IMPRESSION: Unremarkable pelvic ultrasound, without evidence of an intrauterine pregnancy. Correlation with follow-up pelvic ultrasound and serial beta HCG levels is recommended if this remains of clinical concern.     Electronically Signed   By: Suzen Dials M.D.   On: 09/09/2024 14:32      I have reviewed the patient chart and performed the physical exam . I have ordered & interpreted the lab results and reviewed and interpreted the ultrasound images and agree with the radiologist findings Medications ordered as stated below.  A/P as described below.  Counseling and education provided and patient agreeable  with plan as described below. Verbalized  understanding.    ASSESSMENT Medical screening exam complete  1. Elevated serum hCG (  Primary)  2. Pregnancy of unknown anatomic location    PLAN  F/U in MAU on Sunday 09/11/24 for follow up HCG   Discharge from MAU in stable condition  See AVS for full description of educational information and instructions provided to the patient at time of discharge   Warning signs for worsening condition that would warrant emergency follow-up discussed Patient may return to MAU as needed   Littie Olam LABOR, NP 09/09/2024 2:19 PM   This chart was dictated using voice recognition software, Dragon. Despite the best efforts of this provider to proofread and correct errors, errors may still occur which can change documentation meaning.

## 2024-09-09 NOTE — MAU Note (Signed)
 Darlene Morgan is a 28 y.o. at Unknown here in MAU reporting: had cramping yesterday and stared having vag bleeding today. Took HPT and it was positive. Mild cramping today  LMP: 08/08/2024 Onset of complaint: today Pain score: 1  Vitals:   09/09/24 1243  BP: 110/70  Pulse: 89  Resp: 18  Temp: 98.3 F (36.8 C)     FHT: n/a   Lab orders placed from triage: UPT, U/A wet Gc

## 2024-09-09 NOTE — MAU Provider Note (Signed)
 History     CSN: [id number]  Arrival date and time: 09/09/24 2232 First Provider Initiated Contact with Patient   Chief Complaint  Patient presents with   Vaginal Bleeding    HPI Darlene Morgan is a 28 y.o. [id number] at [redacted]w[redacted]d, 05/15/2025, by Last Menstrual Period, who presents to the Maternity Assessment Unit for VB. Patient was seen here earlier today and states she had more bleeding since DC. This blood was described as brighter red rather than pink. She showed me several photos on her phone of spotting.  She is very concerned that she is having a miscarriage. She is asking how to know if she is having a miscarriage. Reviewed test results with patient. I explained US  results and quant beta level, reason for serial testing. She asked about rechecking beta now, and I explained that there is not utility in this.   Patient reports a bangin headache, requests Tylenol  and water.   Medications Prior to Admission  Medication Sig Dispense Refill Last Dose/Taking   Prenat-Fe Poly-Methfol-FA-DHA (VITAFOL  ULTRA) 29-0.6-0.4-200 MG CAPS Take 1 capsule by mouth daily before breakfast. (Patient not taking: Reported on 02/16/2023) 90 capsule 4    lidocaine  (XYLOCAINE ) 2 % solution Use as directed 15 mLs in the mouth or throat as needed for mouth pain. 100 mL 0    Lisdexamfetamine Dimesylate (VYVANSE PO) Take by mouth.       Past Medical History:  Diagnosis Date   ADHD    Asthma    Heart murmur    pt states small    Supervision of normal first pregnancy, antepartum 01/25/2016    Clinic [facility] Prenatal Labs Dating LMP = 17 wk u/s Blood type: O/POS/-- (02/24 1537)  Genetic Screen 1 Screen:    AFP:     Quad:     NIPS: Antibody:NEG (02/24 1537) Anatomic US  WNL female Rubella: 2.05 (02/24 1537) GTT Third trimester: 77/63/69 RPR: Non Reactive (07/05 0840)  Flu vaccine  HBsAg: NEGATIVE (02/24 1537)  TDaP vaccine                                               HIV: Non Reactive (07/05 0840)  Baby Food                Breast                                GBS: (For PCN allergy, check sensitivities) Contraception  Depo injections Pap: Circumcision NA  Pediatrician  [facility] [place]  Support Person        Past Surgical History:  Procedure Laterality Date   INDUCED ABORTION     Feb 2025   TONSILLECTOMY       Allergies:  Allergies  Allergen Reactions   Hydrocodone Itching    ROS reviewed and pertinent positives and negatives as documented in HPI.    Physical Exam  BP 109/80 (BP Location: Right Arm)   Pulse (!) 122   Temp 98.4 F (36.9 C) (Oral)   Resp 17   Ht 5' 5 (1.651 m)   Wt 68 kg   LMP 08/08/2024   SpO2 100%   BMI 24.96 kg/m   Gen: alert, mild distress CV: tachycardia Resp: nonlabored   Labs See prior note from today's date.  Assessment and Plan  MDM Darlene Morgan is a 28 y.o. [id number] at [redacted]w[redacted]d, 05/15/2025, by Last Menstrual Period, who presents to the MAU for VB.  Ddx:  1st trimester vaginal bleeding includes but is not limited to: ectopic pregnancy, complete spontaneous abortion, incomplete abortion, missed abortion, threatened abortion, embryonic/fetal demise, cervical insufficiency, cervical or vaginal disorder. Pt expresses understanding of the plan, she will return to MAU in 2 days for f/u testing as previously planned. She would like to leave now, accepts Tylenol  but does not want to wait for recheck.  1. Elevated serum hCG (Primary)  2. Vaginal bleeding   Results pending at the time of DC: none Dispo: DC home in stable condition with return precautions discussed and included in AVS.    Darlene Maier, DO FMOB Fellow, Faculty Practice [facility]

## 2024-09-09 NOTE — MAU Note (Signed)
..  Darlene Morgan is a 28 y.o. at [redacted]w[redacted]d here in MAU reporting: was here earlier for vaginal bleeding and now bleeding is worse. Reports that the bleeding is worse when she wipes and has some on her panty-liner.  Has abdominal pain 2/10 that feels like cramping. Headache 4/10  Pain score: refer to note Vitals:   09/09/24 2245  BP: 109/80  Pulse: (!) 122  Resp: 17  Temp: 98.4 F (36.9 C)  SpO2: 100%     FHT:n/a Lab orders placed from triage:  none

## 2024-09-11 ENCOUNTER — Encounter (HOSPITAL_COMMUNITY): Payer: Self-pay | Admitting: Obstetrics & Gynecology

## 2024-09-11 ENCOUNTER — Other Ambulatory Visit: Payer: Self-pay

## 2024-09-11 ENCOUNTER — Inpatient Hospital Stay (HOSPITAL_COMMUNITY)
Admission: AD | Admit: 2024-09-11 | Discharge: 2024-09-11 | Disposition: A | Discharge status: Home / Self Care | Payer: Self-pay | Attending: Obstetrics & Gynecology | Admitting: Obstetrics & Gynecology

## 2024-09-11 DIAGNOSIS — Z3A01 Less than 8 weeks gestation of pregnancy: Secondary | ICD-10-CM | POA: Diagnosis not present

## 2024-09-11 DIAGNOSIS — O039 Complete or unspecified spontaneous abortion without complication: Secondary | ICD-10-CM

## 2024-09-11 LAB — CBC
HCT: 37.7 % (ref 36.0–46.0)
Hemoglobin: 12.8 g/dL (ref 12.0–15.0)
MCH: 30 pg (ref 26.0–34.0)
MCHC: 34 g/dL (ref 30.0–36.0)
MCV: 88.3 fL (ref 80.0–100.0)
Platelets: 374 K/uL (ref 150–400)
RBC: 4.27 MIL/uL (ref 3.87–5.11)
RDW: 13.2 % (ref 11.5–15.5)
WBC: 5.1 K/uL (ref 4.0–10.5)
nRBC: 0 % (ref 0.0–0.2)

## 2024-09-11 LAB — HCG, QUANTITATIVE, PREGNANCY: hCG, Beta Chain, Quant, S: 19 m[IU]/mL — ABNORMAL HIGH (ref <5)

## 2024-09-11 MED ORDER — ACETAMINOPHEN 500 MG PO TABS
1000.0000 mg | ORAL_TABLET | Freq: Once | ORAL | Status: AC
  Administered 2024-09-11: 1000 mg via ORAL
  Filled 2024-09-11: qty 2

## 2024-09-11 NOTE — MAU Provider Note (Signed)
 History     CSN: [id number]  Arrival date and time: 09/11/24 1150   Event Date/Time   First Provider Initiated Contact with Patient 09/11/24 1244      Chief Complaint  Patient presents with   Follow-up   HPI  Darlene Morgan is a 28 y.o. [id number] at [redacted]w[redacted]d who presents for evaluation of vaginal bleeding. Patient reports she is having bleeding like a period. She was see in MAU on 10/10 and diagnosed with a PUL. She was supposed to come for repeat blood work and then she started bleeding. She denies any pain. She is very tearful and feels dizzy. She is worried she had too much bleeding.  OB History     Gravida  6   Para  2   Term  2   Preterm  0   AB  2   Living  2      SAB  0   IAB  2   Ectopic  0   Multiple  0   Live Births  2           Past Medical History:  Diagnosis Date   ADHD    Asthma    Heart murmur    pt states small    Supervision of normal first pregnancy, antepartum 01/25/2016    Clinic [facility] Prenatal Labs Dating LMP = 17 wk u/s Blood type: O/POS/-- (02/24 1537)  Genetic Screen 1 Screen:    AFP:     Quad:     NIPS: Antibody:NEG (02/24 1537) Anatomic US  WNL female Rubella: 2.05 (02/24 1537) GTT Third trimester: 77/63/69 RPR: Non Reactive (07/05 0840)  Flu vaccine  HBsAg: NEGATIVE (02/24 1537)  TDaP vaccine                                               HIV: Non Reactive (07/05 0840)  Baby Food               Breast                                GBS: (For PCN allergy, check sensitivities) Contraception  Depo injections Pap: Circumcision NA  Pediatrician  [facility] [place]  Support Person        Past Surgical History:  Procedure Laterality Date   INDUCED ABORTION     Feb 2025   TONSILLECTOMY      Family History  Problem Relation Age of Onset   Hypertension Other    Hypertension Maternal Grandmother    Thyroid  disease Maternal Grandmother    Thyroid  disease Mother    Heart attack Father     Social History   Tobacco Use   Smoking  status: Former    Types: Cigarettes    Passive exposure: Never   Smokeless tobacco: Current  Vaping Use   Vaping status: Every Day  Substance Use Topics   Alcohol use: Yes    Comment: occasionally   Drug use: Not Currently    Types: Marijuana    Allergies:  Allergies  Allergen Reactions   Hydrocodone Itching    No medications prior to admission.    Review of Systems  Constitutional: Negative.  Negative for fatigue and fever.  HENT: Negative.    Respiratory: Negative.  Negative for shortness of breath.  Cardiovascular: Negative.  Negative for chest pain.  Gastrointestinal: Negative.  Negative for abdominal pain, constipation, diarrhea, nausea and vomiting.  Genitourinary:  Positive for vaginal bleeding. Negative for dysuria and vaginal discharge.  Neurological: Negative.  Negative for dizziness and headaches.   Physical Exam   Blood pressure 115/68, pulse (!) 104, temperature 98.4 F (36.9 C), resp. rate 12, last menstrual period 08/08/2024.  Patient Vitals for the past 24 hrs:  BP Temp Pulse Resp  09/11/24 1217 115/68 98.4 F (36.9 C) (!) 104 12    Physical Exam Vitals and nursing note reviewed.  Constitutional:      General: She is not in acute distress.    Appearance: She is well-developed.  HENT:     Head: Normocephalic.  Eyes:     Pupils: Pupils are equal, round, and reactive to light.  Cardiovascular:     Rate and Rhythm: Normal rate and regular rhythm.     Heart sounds: Normal heart sounds.  Pulmonary:     Effort: Pulmonary effort is normal. No respiratory distress.     Breath sounds: Normal breath sounds.  Abdominal:     General: Bowel sounds are normal. There is no distension.     Palpations: Abdomen is soft.     Tenderness: There is no abdominal tenderness.  Skin:    General: Skin is warm and dry.  Neurological:     Mental Status: She is alert and oriented to person, place, and time.  Psychiatric:        Mood and Affect: Mood normal.         Behavior: Behavior normal.        Thought Content: Thought content normal.        Judgment: Judgment normal.      MAU Course  Procedures  Results for orders placed or performed during the hospital encounter of 09/11/24 (from the past 24 hours)  CBC     Status: None   Collection Time: 09/11/24 12:37 PM  Result Value Ref Range   WBC 5.1 4.0 - 10.5 K/uL   RBC 4.27 3.87 - 5.11 MIL/uL   Hemoglobin 12.8 12.0 - 15.0 g/dL   HCT 62.2 63.9 - 53.9 %   MCV 88.3 80.0 - 100.0 fL   MCH 30.0 26.0 - 34.0 pg   MCHC 34.0 30.0 - 36.0 g/dL   RDW 86.7 88.4 - 84.4 %   Platelets 374 150 - 400 K/uL   nRBC 0.0 0.0 - 0.2 %  hCG, quantitative, pregnancy     Status: Abnormal   Collection Time: 09/11/24 12:37 PM  Result Value Ref Range   hCG, Beta Chain, Quant, S 19 (H) <5 mIU/mL    MDM Labs ordered and reviewed.   CBC, HCG  Falling HCG indicative of miscarriage. Condolences provided and reviewed follow up plan. Patient agreeable to plan of care and message sent to clinic.   Assessment and Plan   1. Miscarriage   2. [redacted] weeks gestation of pregnancy     -Discharge home in stable condition -Vaginal bleeding and pain precautions discussed -Patient advised to follow-up with OB in 1 week for repeat blood work and 2 weeks with a provider, message sent. -Patient may return to MAU as needed or if her condition were to change or worsen  Aleck CHRISTELLA Fireman, CNM 09/11/2024, 12:45 PM

## 2024-09-11 NOTE — MAU Note (Signed)
.  Darlene Morgan is a 28 y.o. at [redacted]w[redacted]d here in MAU reporting: reports here for repeat blood work and another ultrasound. Headache 1/10 feels dizzy bleeding is heavier   Pain score: 1/10 headache 1/10 cramping  There were no vitals filed for this visit.    Lab orders placed from triage:   none

## 2024-09-12 LAB — GC/CHLAMYDIA PROBE AMP (~~LOC~~) NOT AT ARMC
Chlamydia: NEGATIVE
Comment: NEGATIVE
Comment: NORMAL
Neisseria Gonorrhea: NEGATIVE

## 2024-09-20 ENCOUNTER — Other Ambulatory Visit

## 2024-09-21 ENCOUNTER — Other Ambulatory Visit

## 2024-09-21 DIAGNOSIS — O039 Complete or unspecified spontaneous abortion without complication: Secondary | ICD-10-CM

## 2024-09-22 ENCOUNTER — Ambulatory Visit: Payer: Self-pay | Admitting: Obstetrics & Gynecology

## 2024-09-22 LAB — BETA HCG QUANT (REF LAB): hCG Quant: <1 m[IU]/mL

## 2024-09-27 ENCOUNTER — Ambulatory Visit: Admitting: Obstetrics and Gynecology
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
## 2024-11-03 ENCOUNTER — Telehealth: Admitting: Physician Assistant

## 2024-11-03 ENCOUNTER — Other Ambulatory Visit: Payer: Self-pay | Admitting: Obstetrics and Gynecology

## 2024-11-03 ENCOUNTER — Other Ambulatory Visit: Payer: Self-pay

## 2024-11-03 ENCOUNTER — Encounter: Payer: Self-pay | Admitting: Physician Assistant

## 2024-11-03 DIAGNOSIS — B9689 Other specified bacterial agents as the cause of diseases classified elsewhere: Secondary | ICD-10-CM

## 2024-11-03 MED ORDER — METRONIDAZOLE 500 MG PO TABS: 500.0000 mg | ORAL_TABLET | Freq: Two times a day (BID) | ORAL | 0 refills | Status: DC

## 2024-12-16 ENCOUNTER — Ambulatory Visit (HOSPITAL_COMMUNITY): Admission: EM | Admit: 2024-12-16 | Discharge: 2024-12-16 | Disposition: A | Discharge status: Home / Self Care

## 2024-12-16 ENCOUNTER — Ambulatory Visit (INDEPENDENT_AMBULATORY_CARE_PROVIDER_SITE_OTHER)

## 2024-12-16 ENCOUNTER — Encounter (HOSPITAL_COMMUNITY): Payer: Self-pay

## 2024-12-16 DIAGNOSIS — R0789 Other chest pain: Secondary | ICD-10-CM | POA: Diagnosis not present

## 2024-12-16 LAB — POCT URINE PREGNANCY: Preg Test, Ur: NEGATIVE

## 2024-12-16 MED ORDER — IBUPROFEN 600 MG PO TABS: 600.0000 mg | ORAL_TABLET | Freq: Four times a day (QID) | ORAL | 0 refills | Status: AC | PRN

## 2024-12-16 NOTE — Discharge Instructions (Addendum)
 Your pregnancy test was negative.  Your chest x-ray did not reveal underlying causes for your chest pain.  I suspect your pain is likely muscular in nature. Alternate between ibuprofen  and Tylenol  every 6-8 hours as needed for pain.  You can also try applying heat and ice to help with pain. As discussed your pain could also be related to some indigestion.  You can take over-the-counter Tums to help with this as well. Follow-up with your primary care provider if your symptoms continue for further evaluation. If you were to develop worsening chest pain, severe shortness of breath, or severe weakness please seek any medical treatment in the emergency department.

## 2024-12-16 NOTE — ED Provider Notes (Signed)
 " [facility]    CSN: [id number] Arrival date & time: 12/16/24  1159      History   Chief Complaint Chief Complaint  Patient presents with   Chest Pain    HPI Darlene Morgan is a 29 y.o. female.   Patient present with generalized chest pain that began yesterday.  Patient reports the chest pain is intermittent and seems to be worse with deep breathing.  Patient does endorse some mild intermittent shortness of breath as well.  Patient does report a history of asthma and wonders if this could be related.  Denies any wheezing, cough, gesturing, or fever.  Patient reports that she does vape and has been vaping for about a year or so.  Patient also reports that she took a THC gummy yesterday.  Patient also reports that she felt like this could be gas related pain.  Patient denies any nausea, vomiting, diarrhea, or constipation, but states that she has had indigestion before that felt similar to this chest pain.  Patient reports that she has not had a menstrual cycle since 11/26.  Patient reports that she did have a miscarriage in December and thought that this was likely why she has not had a menstrual cycle since, but wants to be sure as she has been sexually active.  The history is provided by the patient and medical records.  Chest Pain   Past Medical History:  Diagnosis Date   ADHD    Asthma    Heart murmur    pt states small    Supervision of normal first pregnancy, antepartum 01/25/2016    Clinic [facility] Prenatal Labs Dating LMP = 17 wk u/s Blood type: O/POS/-- (02/24 1537)  Genetic Screen 1 Screen:    AFP:     Quad:     NIPS: Antibody:NEG (02/24 1537) Anatomic US  WNL female Rubella: 2.05 (02/24 1537) GTT Third trimester: 77/63/69 RPR: Non Reactive (07/05 0840)  Flu vaccine  HBsAg: NEGATIVE (02/24 1537)  TDaP vaccine                                               HIV: Non Reactive (07/05 0840)  Baby Food               Breast                                GBS: (For PCN  allergy, check sensitivities) Contraception  Depo injections Pap: Circumcision NA  Pediatrician  [facility] [place]  Support Person        Patient Active Problem List   Diagnosis Date Noted   Vaginal delivery 10/17/2021   Poor weight gain of pregnancy, third trimester 09/12/2021   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 coli UTI 02/27/2021   Hyperthyroidism 02/08/2018    Past Surgical History:  Procedure Laterality Date   INDUCED ABORTION     Feb 2025   TONSILLECTOMY      OB History     Gravida  6   Para  2   Term  2   Preterm  0   AB  2   Living  2      SAB  0   IAB  2   Ectopic  0   Multiple  0   Live Births  2            Home Medications    Prior to Admission medications  Medication Sig Start Date End Date Taking? Authorizing Provider  ibuprofen  (ADVIL ) 600 MG tablet Take 1 tablet (600 mg total) by mouth every 6 (six) hours as needed. 12/16/24  Yes Deidrick Rainey A, NP  VYVANSE 20 MG capsule Take 20 mg by mouth every morning. 11/05/24  Yes [provider]    Family History Family History  Problem Relation Age of Onset   Hypertension Other    Hypertension Maternal Grandmother    Thyroid  disease Maternal Grandmother    Thyroid  disease Mother    Heart attack Father     Social History Social History[1]   Allergies   Hydrocodone   Review of Systems Review of Systems  Cardiovascular:  Positive for chest pain.   Per HPI  Physical Exam Triage Vital Signs ED Triage Vitals  Encounter Vitals Group     BP 12/16/24 1232 109/66     Girls Systolic BP Percentile --      Girls Diastolic BP Percentile --      Boys Systolic BP Percentile --      Boys Diastolic BP Percentile --      Pulse Rate 12/16/24 1232 79     Resp 12/16/24 1232 16     Temp 12/16/24 1232 98.1 F (36.7 C)     Temp Source 12/16/24 1232 Oral     SpO2 12/16/24 1232 98 %      Weight --      Height --      Head Circumference --      Peak Flow --      Pain Score 12/16/24 1227 9     Pain Loc --      Pain Education --      Exclude from Growth Chart --    No data found.  Updated Vital Signs BP 109/66 (BP Location: Left Arm)   Pulse 79   Temp 98.1 F (36.7 C) (Oral)   Resp 16   LMP 10/26/2024   SpO2 98%   Breastfeeding No   Visual Acuity Right Eye Distance:   Left Eye Distance:   Bilateral Distance:    Right Eye Near:   Left Eye Near:    Bilateral Near:     Physical Exam Vitals and nursing note reviewed.  Constitutional:      General: She is awake. She is not in acute distress.    Appearance: Normal appearance. She is well-developed and well-groomed. She is not ill-appearing.  Cardiovascular:     Rate and Rhythm: Normal rate and regular rhythm.  Pulmonary:     Effort: Pulmonary effort is normal.     Breath sounds: Normal breath sounds.  Chest:     Chest wall: Tenderness present. No mass, swelling or edema.       Comments: Pain reproducible upon palpation to right upper chest, left upper chest, and central chest. Skin:    General: Skin is warm and dry.  Neurological:     General: No focal deficit present.     Mental Status: She is alert and oriented to person, place, and time. Mental status is at baseline.  Psychiatric:        Behavior: Behavior is cooperative.      UC Treatments / Results  Labs (all labs ordered are listed, but  only abnormal results are displayed) Labs Reviewed  POCT URINE PREGNANCY    EKG   Radiology DG Chest 2 View Result Date: 12/16/2024 CLINICAL DATA:  Chest pain EXAM: CHEST - 2 VIEW COMPARISON:  December 28, 2015 FINDINGS: The heart size and mediastinal contours are within normal limits. Both lungs are clear. The visualized skeletal structures are unremarkable. IMPRESSION: No active cardiopulmonary disease. Electronically Signed   By: Lynwood Landy Raddle M.D.   On: 12/16/2024 13:40     Procedures Procedures (including critical care time)  Medications Ordered in UC Medications - No data to display  Initial Impression / Assessment and Plan / UC Course  I have reviewed the triage vital signs and the nursing notes.  Pertinent labs & imaging results that were available during my care of the patient were reviewed by me and considered in my medical decision making (see chart for details).     Patient is overall well-appearing.  Vitals stable.  UPT negative.  Pain is reproducible to palpation.  Heart and lung sounds normal.  EKG reveals normal sinus rhythm without ST elevation or acute cardiac findings.  Ordered chest x-ray to rule out underlying causes related to chest pain.  I independently interpreted these images and there is no active cardiopulmonary disease at this time.  Radiology report confirms this.  Suspect chest pain is likely muscular in nature.  Prescribed ibuprofen  to alternate with Tylenol  as needed for pain.  Discussed follow-up, return, and strict ER precautions. Final Clinical Impressions(s) / UC Diagnoses   Final diagnoses:  Chest wall pain     Discharge Instructions      Your pregnancy test was negative.  Your chest x-ray did not reveal underlying causes for your chest pain.  I suspect your pain is likely muscular in nature. Alternate between ibuprofen  and Tylenol  every 6-8 hours as needed for pain.  You can also try applying heat and ice to help with pain. As discussed your pain could also be related to some indigestion.  You can take over-the-counter Tums to help with this as well. Follow-up with your primary care provider if your symptoms continue for further evaluation. If you were to develop worsening chest pain, severe shortness of breath, or severe weakness please seek any medical treatment in the emergency department.     ED Prescriptions     Medication Sig Dispense Auth. Provider   ibuprofen  (ADVIL ) 600 MG tablet Take 1 tablet (600 mg  total) by mouth every 6 (six) hours as needed. 30 tablet Johnie Flaming A, NP      PDMP not reviewed this encounter.    [1]  Social History Tobacco Use   Smoking status: Former    Types: Cigarettes    Passive exposure: Never   Smokeless tobacco: Current  Vaping Use   Vaping status: Every Day  Substance Use Topics   Alcohol use: Yes    Comment: occasionally   Drug use: Not Currently    Types: Marijuana     Johnie Flaming LABOR, NP 12/16/24 1347  "

## 2024-12-16 NOTE — ED Triage Notes (Signed)
 Patient here today with c/o chest pain since yesterday. Patient states that she has a h/o asthma and a heart murmur when she was younger. Patient also states that that she has been vaping for a bout a year or more. Patient states that she has some SOB as well. Patient took a THC gummy yesterday.
